# Patient Record
Sex: Female | Born: 1955
Health system: Southern US, Community
[De-identification: ages and names within clinical notes are randomized; demographics above are authoritative.]

## PROBLEM LIST (undated history)

## (undated) DIAGNOSIS — I639 Cerebral infarction, unspecified: Secondary | ICD-10-CM

## (undated) DIAGNOSIS — M8580 Other specified disorders of bone density and structure, unspecified site: Secondary | ICD-10-CM

## (undated) DIAGNOSIS — Z8619 Personal history of other infectious and parasitic diseases: Secondary | ICD-10-CM

## (undated) DIAGNOSIS — I10 Essential (primary) hypertension: Secondary | ICD-10-CM

## (undated) DIAGNOSIS — Q2112 Patent foramen ovale: Secondary | ICD-10-CM

## (undated) DIAGNOSIS — E079 Disorder of thyroid, unspecified: Secondary | ICD-10-CM

## (undated) DIAGNOSIS — R9089 Other abnormal findings on diagnostic imaging of central nervous system: Secondary | ICD-10-CM

## (undated) DIAGNOSIS — R0789 Other chest pain: Secondary | ICD-10-CM

## (undated) DIAGNOSIS — K743 Primary biliary cirrhosis: Secondary | ICD-10-CM

## (undated) DIAGNOSIS — J189 Pneumonia, unspecified organism: Secondary | ICD-10-CM

## (undated) DIAGNOSIS — E7800 Pure hypercholesterolemia, unspecified: Secondary | ICD-10-CM

## (undated) DIAGNOSIS — Q211 Atrial septal defect: Secondary | ICD-10-CM

## (undated) DIAGNOSIS — G43109 Migraine with aura, not intractable, without status migrainosus: Secondary | ICD-10-CM

## (undated) DIAGNOSIS — M3500 Sicca syndrome, unspecified: Secondary | ICD-10-CM

## (undated) DIAGNOSIS — E785 Hyperlipidemia, unspecified: Secondary | ICD-10-CM

## (undated) HISTORY — DX: Other specified disorders of bone density and structure, unspecified site: M85.80

## (undated) HISTORY — DX: Other abnormal findings on diagnostic imaging of central nervous system: R90.89

## (undated) HISTORY — PX: FOOT SURGERY: SHX648

## (undated) HISTORY — DX: Patent foramen ovale: Q21.12

## (undated) HISTORY — DX: Cerebral infarction, unspecified: I63.9

## (undated) HISTORY — PX: FACIAL COSMETIC SURGERY: SHX629

## (undated) HISTORY — PX: TONSILLECTOMY: SHX5217

## (undated) HISTORY — DX: Pneumonia, unspecified organism: J18.9

## (undated) HISTORY — DX: Other chest pain: R07.89

## (undated) HISTORY — PX: COLONOSCOPY: SHX174

## (undated) HISTORY — DX: Essential (primary) hypertension: I10

## (undated) HISTORY — DX: Personal history of other infectious and parasitic diseases: Z86.19

## (undated) HISTORY — PX: BREAST SURGERY: SHX581

## (undated) HISTORY — DX: Sjogren syndrome, unspecified: M35.00

## (undated) HISTORY — DX: Primary biliary cirrhosis: K74.3

## (undated) HISTORY — DX: Atrial septal defect: Q21.1

## (undated) HISTORY — PX: TUBAL LIGATION: SHX77

## (undated) HISTORY — DX: Migraine with aura, not intractable, without status migrainosus: G43.109

## (undated) HISTORY — DX: Hyperlipidemia, unspecified: E78.5

## (undated) HISTORY — DX: Disorder of thyroid, unspecified: E07.9

## (undated) HISTORY — DX: Pure hypercholesterolemia, unspecified: E78.00

---

## 1998-03-07 ENCOUNTER — Other Ambulatory Visit: Admission: RE | Admit: 1998-03-07 | Discharge: 1998-03-07 | Payer: Self-pay | Admitting: Obstetrics and Gynecology

## 1998-06-15 ENCOUNTER — Other Ambulatory Visit: Admission: RE | Admit: 1998-06-15 | Discharge: 1998-06-15 | Payer: Self-pay | Admitting: Family Medicine

## 1998-06-16 ENCOUNTER — Ambulatory Visit (HOSPITAL_COMMUNITY): Admission: RE | Admit: 1998-06-16 | Discharge: 1998-06-16 | Payer: Self-pay | Admitting: Specialist

## 1999-01-31 ENCOUNTER — Ambulatory Visit (HOSPITAL_COMMUNITY): Admission: RE | Admit: 1999-01-31 | Discharge: 1999-01-31 | Payer: Self-pay | Admitting: Family Medicine

## 1999-06-12 ENCOUNTER — Other Ambulatory Visit: Admission: RE | Admit: 1999-06-12 | Discharge: 1999-06-12 | Payer: Self-pay | Admitting: *Deleted

## 2000-05-12 ENCOUNTER — Encounter: Payer: Self-pay | Admitting: Family Medicine

## 2000-05-12 ENCOUNTER — Ambulatory Visit (HOSPITAL_COMMUNITY): Admission: RE | Admit: 2000-05-12 | Discharge: 2000-05-12 | Payer: Self-pay | Admitting: Family Medicine

## 2000-06-03 ENCOUNTER — Other Ambulatory Visit: Admission: RE | Admit: 2000-06-03 | Discharge: 2000-06-03 | Payer: Self-pay | Admitting: Family Medicine

## 2000-06-27 ENCOUNTER — Encounter: Admission: RE | Admit: 2000-06-27 | Discharge: 2000-06-27 | Payer: Self-pay | Admitting: Family Medicine

## 2000-06-27 ENCOUNTER — Encounter: Payer: Self-pay | Admitting: Family Medicine

## 2001-06-12 ENCOUNTER — Other Ambulatory Visit: Admission: RE | Admit: 2001-06-12 | Discharge: 2001-06-12 | Payer: Self-pay | Admitting: Family Medicine

## 2001-06-29 ENCOUNTER — Encounter: Payer: Self-pay | Admitting: Family Medicine

## 2001-06-29 ENCOUNTER — Encounter: Admission: RE | Admit: 2001-06-29 | Discharge: 2001-06-29 | Payer: Self-pay | Admitting: Family Medicine

## 2002-06-22 ENCOUNTER — Other Ambulatory Visit: Admission: RE | Admit: 2002-06-22 | Discharge: 2002-06-22 | Payer: Self-pay | Admitting: Family Medicine

## 2002-07-02 ENCOUNTER — Encounter: Admission: RE | Admit: 2002-07-02 | Discharge: 2002-07-02 | Payer: Self-pay | Admitting: Family Medicine

## 2002-07-02 ENCOUNTER — Encounter: Payer: Self-pay | Admitting: Family Medicine

## 2002-10-14 ENCOUNTER — Ambulatory Visit (HOSPITAL_COMMUNITY): Admission: RE | Admit: 2002-10-14 | Discharge: 2002-10-14 | Payer: Self-pay | Admitting: Family Medicine

## 2002-10-14 ENCOUNTER — Encounter: Payer: Self-pay | Admitting: Family Medicine

## 2003-06-04 ENCOUNTER — Encounter: Payer: Self-pay | Admitting: Family Medicine

## 2003-06-04 ENCOUNTER — Ambulatory Visit (HOSPITAL_COMMUNITY): Admission: RE | Admit: 2003-06-04 | Discharge: 2003-06-04 | Payer: Self-pay | Admitting: Family Medicine

## 2003-07-05 ENCOUNTER — Other Ambulatory Visit: Admission: RE | Admit: 2003-07-05 | Discharge: 2003-07-05 | Payer: Self-pay | Admitting: Family Medicine

## 2003-07-22 ENCOUNTER — Encounter: Payer: Self-pay | Admitting: Family Medicine

## 2003-07-22 ENCOUNTER — Encounter: Admission: RE | Admit: 2003-07-22 | Discharge: 2003-07-22 | Payer: Self-pay | Admitting: Family Medicine

## 2003-08-19 ENCOUNTER — Ambulatory Visit (HOSPITAL_COMMUNITY): Admission: RE | Admit: 2003-08-19 | Discharge: 2003-08-19 | Payer: Self-pay | Admitting: Gastroenterology

## 2004-07-24 ENCOUNTER — Other Ambulatory Visit: Admission: RE | Admit: 2004-07-24 | Discharge: 2004-07-24 | Payer: Self-pay | Admitting: *Deleted

## 2005-06-17 ENCOUNTER — Ambulatory Visit: Payer: Self-pay | Admitting: Cardiology

## 2005-06-21 ENCOUNTER — Ambulatory Visit: Payer: Self-pay

## 2005-09-09 ENCOUNTER — Other Ambulatory Visit: Admission: RE | Admit: 2005-09-09 | Discharge: 2005-09-09 | Payer: Self-pay | Admitting: Family Medicine

## 2005-11-25 DIAGNOSIS — J189 Pneumonia, unspecified organism: Secondary | ICD-10-CM

## 2005-11-25 HISTORY — DX: Pneumonia, unspecified organism: J18.9

## 2006-01-23 ENCOUNTER — Inpatient Hospital Stay (HOSPITAL_COMMUNITY): Admission: EM | Admit: 2006-01-23 | Discharge: 2006-01-25 | Payer: Self-pay | Admitting: Emergency Medicine

## 2006-09-12 ENCOUNTER — Other Ambulatory Visit: Admission: RE | Admit: 2006-09-12 | Discharge: 2006-09-12 | Payer: Self-pay | Admitting: Family Medicine

## 2008-11-25 DIAGNOSIS — I639 Cerebral infarction, unspecified: Secondary | ICD-10-CM

## 2008-11-25 HISTORY — DX: Cerebral infarction, unspecified: I63.9

## 2009-09-11 ENCOUNTER — Encounter: Payer: Self-pay | Admitting: Internal Medicine

## 2009-09-11 ENCOUNTER — Inpatient Hospital Stay (HOSPITAL_COMMUNITY): Admission: AD | Admit: 2009-09-11 | Discharge: 2009-09-13 | Payer: Self-pay | Admitting: Internal Medicine

## 2009-09-12 ENCOUNTER — Ambulatory Visit: Payer: Self-pay | Admitting: Vascular Surgery

## 2009-09-12 ENCOUNTER — Encounter (INDEPENDENT_AMBULATORY_CARE_PROVIDER_SITE_OTHER): Payer: Self-pay | Admitting: Family Medicine

## 2009-10-04 ENCOUNTER — Encounter: Payer: Self-pay | Admitting: Cardiovascular Disease

## 2009-10-10 ENCOUNTER — Encounter: Payer: Self-pay | Admitting: Cardiovascular Disease

## 2009-10-11 ENCOUNTER — Encounter: Payer: Self-pay | Admitting: Cardiovascular Disease

## 2009-10-11 ENCOUNTER — Ambulatory Visit: Payer: Self-pay

## 2009-10-16 ENCOUNTER — Encounter: Payer: Self-pay | Admitting: Cardiovascular Disease

## 2009-10-16 ENCOUNTER — Encounter (INDEPENDENT_AMBULATORY_CARE_PROVIDER_SITE_OTHER): Payer: Self-pay | Admitting: *Deleted

## 2009-10-16 ENCOUNTER — Telehealth (INDEPENDENT_AMBULATORY_CARE_PROVIDER_SITE_OTHER): Payer: Self-pay | Admitting: *Deleted

## 2009-10-17 ENCOUNTER — Encounter: Payer: Self-pay | Admitting: Cardiovascular Disease

## 2009-10-24 ENCOUNTER — Ambulatory Visit: Payer: Self-pay | Admitting: Cardiovascular Disease

## 2009-10-24 ENCOUNTER — Ambulatory Visit (HOSPITAL_COMMUNITY): Admission: RE | Admit: 2009-10-24 | Discharge: 2009-10-24 | Payer: Self-pay | Admitting: Cardiovascular Disease

## 2010-03-01 ENCOUNTER — Observation Stay (HOSPITAL_COMMUNITY): Admission: EM | Admit: 2010-03-01 | Discharge: 2010-03-02 | Payer: Self-pay | Admitting: Emergency Medicine

## 2010-03-01 ENCOUNTER — Ambulatory Visit: Payer: Self-pay | Admitting: Cardiology

## 2010-03-02 ENCOUNTER — Encounter: Payer: Self-pay | Admitting: Cardiology

## 2010-03-02 ENCOUNTER — Encounter (INDEPENDENT_AMBULATORY_CARE_PROVIDER_SITE_OTHER): Payer: Self-pay | Admitting: Emergency Medicine

## 2010-03-21 DIAGNOSIS — I10 Essential (primary) hypertension: Secondary | ICD-10-CM | POA: Insufficient documentation

## 2010-03-21 DIAGNOSIS — E039 Hypothyroidism, unspecified: Secondary | ICD-10-CM

## 2010-03-21 DIAGNOSIS — Z8679 Personal history of other diseases of the circulatory system: Secondary | ICD-10-CM | POA: Insufficient documentation

## 2010-03-22 ENCOUNTER — Ambulatory Visit: Payer: Self-pay | Admitting: Cardiology

## 2010-03-22 DIAGNOSIS — R072 Precordial pain: Secondary | ICD-10-CM

## 2010-03-30 ENCOUNTER — Telehealth: Payer: Self-pay | Admitting: Cardiology

## 2010-11-05 ENCOUNTER — Emergency Department (HOSPITAL_BASED_OUTPATIENT_CLINIC_OR_DEPARTMENT_OTHER)
Admission: EM | Admit: 2010-11-05 | Discharge: 2010-11-05 | Disposition: A | Discharge status: Other Acute Inpatient Hospital | Payer: Self-pay | Source: Home / Self Care | Admitting: Emergency Medicine

## 2010-11-05 ENCOUNTER — Emergency Department (HOSPITAL_COMMUNITY)
Admission: EM | Admit: 2010-11-05 | Discharge: 2010-11-05 | Payer: Self-pay | Source: Home / Self Care | Admitting: Emergency Medicine

## 2010-12-23 LAB — CONVERTED CEMR LAB
BUN: 8 mg/dL (ref 6–23)
GFR calc non Af Amer: 78.99 mL/min (ref >60)
Magnesium: 2.3 mg/dL (ref 1.5–2.5)

## 2010-12-27 NOTE — Assessment & Plan Note (Signed)
Summary: eph   Visit Type:  Post-hospital   History of Present Illness: She went to see Nash Dimmer, after she used some chemicals, and then had a reaction similar to what she had.  She is much better.  She then went back to work Monday, and felt like someone was choking her.  She is much better.  She had prior evaluation for stroke last fall, of unclear cause.  She is now stable on K supplement.    Current Medications (verified): 1)  Potassium Chloride Cr 10 Meq Cr-Caps (Potassium Chloride) .... Take One Tablet By Mouth Daily 2)  Aspirin 81 Mg Tbec (Aspirin) .... Take One Tablet By Mouth Daily 3)  Ativan 0.5 Mg Tabs (Lorazepam) .... 1/2 Tablet As Needed 4)  Crestor 10 Mg Tabs (Rosuvastatin Calcium) .... Take One Tablet By Mouth Daily. 5)  Hydrochlorothiazide 25 Mg Tabs (Hydrochlorothiazide) .... Take One Tablet By Mouth Daily. 6)  Levothyroxine Sodium 100 Mcg Tabs (Levothyroxine Sodium) .... Take 1 Tablet By Mouth Once A Day 7)  Vitamin D2 400 Iu .... Once A Day  Allergies: 1)  ! Codeine 2)  ! Ibuprofen  Vital Signs:  Patient profile:   55 year old female Height:      65 inches Weight:      124.25 pounds BMI:     20.75 Pulse rate:   67 / minute Pulse rhythm:   regular Resp:     18 per minute BP sitting:   120 / 70  (left arm) Cuff size:   regular  Vitals Entered By: Vikki Ports (March 22, 2010 2:31 PM)  Physical Exam  General:  Well developed, well nourished, in no acute distress. Head:  normocephalic and atraumatic Eyes:  PERRLA/EOM intact; conjunctiva and lids normal. Lungs:  Clear bilaterally to auscultation and percussion. Heart:  PMI non displaced.  No murmur or rub.  No sig gallop. Pulses:  pulses normal in all 4 extremities Extremities:  No clubbing or cyanosis. Neurologic:  Alert and oriented x 3.   EKG  Procedure date:  03/22/2010  Findings:      NSR.  WNL.  Echocardiogram  Procedure date:  09/12/2009  Findings:      Study Conclusions   Left  ventricle: The cavity size was normal. Systolic function was   normal. Wall motion was normal; there were no regional wall motion   abnormalities.   Impressions:    - No cardiac source of emboli was indentified.  Impression & Recommendations:  Problem # 1:  CHEST PAIN, PRECORDIAL (ICD-786.51) symptoms resolved.  See history.  negative intitial workup.  Need for K check discussed. Her updated medication list for this problem includes:    Aspirin 81 Mg Tbec (Aspirin) .Marland Kitchen... Take one tablet by mouth daily  Orders: EKG w/ Interpretation (93000) TLB-BMP (Basic Metabolic Panel-BMET) (80048-METABOL) TLB-Magnesium (Mg) (83735-MG)  Problem # 2:  HYPOTHYROIDISM (ICD-244.9) treated in [place] Her updated medication list for this problem includes:    Levothyroxine Sodium 100 Mcg Tabs (Levothyroxine sodium) .Marland Kitchen... Take 1 tablet by mouth once a day  Problem # 3:  CEREBROVASCULAR ACCIDENT, HX OF (ICD-V12.50) Is followed by neuro.  Had TEE in November by Dr. Eden Emms with negative bubble, no LA appendage clot.  Listed under consultation, not echo.  Patient Instructions: 1)  Your physician recommends that you schedule a follow-up appointment as needed.  2)  Your physician recommends that you have lab work today: BMP, Magnesium 3)  Your physician recommends that you continue on your  current medications as directed. Please refer to the Current Medication list given to you today.

## 2010-12-27 NOTE — Progress Notes (Signed)
Summary: lab results-LM  Phone Note Call from Patient Call back at [phone]   Caller: Patient Summary of Call: Pt calling for lab test Initial call taken by: Judie Grieve,  Mar 30, 2010 8:28 AM  Follow-up for Phone Call        [facility]  we did not rec labs from Dr [facility] office yet.  Will call and request labs and call pt Ledon Snare, RN  Mar 30, 2010 9:55 AM spoke with pt she will have Lupita Leash at Dr [facility] office draw labs on Fri 04/06/10 Dennis Bast, RN, BSN  Mar 30, 2010 4:12 PM

## 2010-12-27 NOTE — Letter (Signed)
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

## 2011-02-05 LAB — BASIC METABOLIC PANEL
BUN: 15 mg/dL (ref 6–23)
CO2: 26 mEq/L (ref 19–32)
Chloride: 103 mEq/L (ref 96–112)
Creatinine, Ser: 0.8 mg/dL (ref 0.4–1.2)
GFR calc non Af Amer: >60 mL/min (ref >60)
Glucose, Bld: 83 mg/dL (ref 70–99)
Potassium: 3.9 mEq/L (ref 3.5–5.1)

## 2011-02-13 LAB — COMPREHENSIVE METABOLIC PANEL
Albumin: 3.8 g/dL (ref 3.5–5.2)
BUN: 11 mg/dL (ref 6–23)
CO2: 28 mEq/L (ref 19–32)
GFR calc Af Amer: >60 mL/min (ref >60)
Glucose, Bld: 84 mg/dL (ref 70–99)
Potassium: 3.3 mEq/L — ABNORMAL LOW (ref 3.5–5.1)
Total Bilirubin: 0.7 mg/dL (ref 0.3–1.2)

## 2011-02-13 LAB — TSH: TSH: 0.344 u[IU]/mL — ABNORMAL LOW (ref 0.350–4.500)

## 2011-02-13 LAB — POCT CARDIAC MARKERS
CKMB, poc: <1 ng/mL — ABNORMAL LOW (ref 1.0–8.0)
Myoglobin, poc: 76.4 ng/mL (ref 12–200)
Troponin i, poc: <0.05 ng/mL (ref 0.00–0.09)

## 2011-02-13 LAB — CARDIAC PANEL(CRET KIN+CKTOT+MB+TROPI)
CK, MB: 0.4 ng/mL (ref 0.3–4.0)
CK, MB: 0.5 ng/mL (ref 0.3–4.0)
Relative Index: INVALID (ref 0.0–2.5)
Relative Index: INVALID (ref 0.0–2.5)
Total CK: 28 U/L (ref 7–177)
Total CK: 31 U/L (ref 7–177)
Total CK: 32 U/L (ref 7–177)
Troponin I: 0.01 ng/mL (ref 0.00–0.06)
Troponin I: <0.01 ng/mL (ref 0.00–0.06)

## 2011-02-13 LAB — DIFFERENTIAL
Basophils Absolute: 0 10*3/uL (ref 0.0–0.1)
Basophils Relative: 0 % (ref 0–1)
Eosinophils Absolute: 0.4 10*3/uL (ref 0.0–0.7)
Lymphocytes Relative: 16 % (ref 12–46)
Monocytes Relative: 11 % (ref 3–12)
Neutrophils Relative %: 65 % (ref 43–77)

## 2011-02-13 LAB — LIPID PANEL
HDL: 58 mg/dL (ref >39)
Total CHOL/HDL Ratio: 2.3 RATIO

## 2011-02-13 LAB — CBC
MCHC: 33.5 g/dL (ref 30.0–36.0)
RDW: 13 % (ref 11.5–15.5)
WBC: 6.1 10*3/uL (ref 4.0–10.5)

## 2011-02-13 LAB — BASIC METABOLIC PANEL
CO2: 27 mEq/L (ref 19–32)
Calcium: 9.1 mg/dL (ref 8.4–10.5)
Glucose, Bld: 89 mg/dL (ref 70–99)

## 2011-02-13 LAB — APTT: aPTT: 33 seconds (ref 24–37)

## 2011-02-13 LAB — CK TOTAL AND CKMB (NOT AT ARMC): Total CK: 36 U/L (ref 7–177)

## 2011-02-13 LAB — TROPONIN I: Troponin I: <0.01 ng/mL (ref 0.00–0.06)

## 2011-02-28 LAB — DIFFERENTIAL
Basophils Absolute: 0 10*3/uL (ref 0.0–0.1)
Eosinophils Absolute: 0.6 10*3/uL (ref 0.0–0.7)
Eosinophils Relative: 7 % — ABNORMAL HIGH (ref 0–5)
Lymphs Abs: 2 10*3/uL (ref 0.7–4.0)
Neutrophils Relative %: 57 % (ref 43–77)

## 2011-02-28 LAB — PROTIME-INR: Prothrombin Time: 13.6 seconds (ref 11.6–15.2)

## 2011-02-28 LAB — TSH: TSH: 1.879 u[IU]/mL (ref 0.350–4.500)

## 2011-02-28 LAB — T4, FREE: Free T4: 1.38 ng/dL (ref 0.80–1.80)

## 2011-02-28 LAB — BASIC METABOLIC PANEL
BUN: 18 mg/dL (ref 6–23)
BUN: 8 mg/dL (ref 6–23)
Calcium: 9 mg/dL (ref 8.4–10.5)
Chloride: 104 mEq/L (ref 96–112)
Creatinine, Ser: 0.79 mg/dL (ref 0.4–1.2)
Creatinine, Ser: 0.79 mg/dL (ref 0.4–1.2)
GFR calc non Af Amer: >60 mL/min (ref >60)
GFR calc non Af Amer: >60 mL/min (ref >60)
Glucose, Bld: 94 mg/dL (ref 70–99)
Glucose, Bld: 140 mg/dL — ABNORMAL HIGH (ref 70–99)
Potassium: 3.1 mEq/L — ABNORMAL LOW (ref 3.5–5.1)

## 2011-02-28 LAB — CBC
HCT: 43.2 % (ref 36.0–46.0)
MCV: 92.9 fL (ref 78.0–100.0)
Platelets: 211 10*3/uL (ref 150–400)
RDW: 13.6 % (ref 11.5–15.5)
WBC: 7.8 10*3/uL (ref 4.0–10.5)

## 2011-04-12 NOTE — H&P (Signed)
Dana [name], Dana [name]                  ACCOUNT NO.:  [id number]   MEDICAL RECORD NO.:  [id number]          PATIENT TYPE:  INP   LOCATION:  1317                         FACILITY:  [facility]   PHYSICIAN:  Kela Millin, M.D.DATE OF BIRTH:  [birth year]   DATE OF ADMISSION:  01/23/2006  DATE OF DISCHARGE:                                HISTORY & PHYSICAL   PRIMARY CARE PHYSICIAN:  Unassigned.   CHIEF COMPLAINT:  Cough and fevers.   HISTORY OF PRESENT ILLNESS:  The patient is a 55 year old white female with  past medical history significant for recent diagnosis of  bronchitis/pneumonia per her primary care physician, hypertension,  hypercholesterolemia, depression/anxiety, and hypothyroidism who presents  with complaints of cough and fevers x6 days. She states that when her  symptoms started she was seen at an urgent care and started on azithromycin.  She did follow up with her primary care physician at which time she was  diagnosed with pneumonia and asked to continue her antibiotics but to follow  up today, Thursday, if she did not feel any better. She states that she  continued to have the cough, slightly productive of greenish sputum as well  as fevers, and so she came back to her primary care physician.  A chest x-  ray was done and she was given Avelox by mouth but she vomited, and then she  was given IM Rocephin and asked to come to the [facility] ER for possible  admission. She denies chest pain, dysuria, melena, hematemesis, hemoptysis,  and no hematochezia.   She was seen in the ER and the chest x-ray she brought with her was  consistent with a right lower lobe pneumonia and also she was found to have  an elevated white cell count of 24 with electrolyte abnormalities, and she  is admitted to the [facility] for further evaluation and  management.   PAST MEDICAL HISTORY:  1.  As stated above.  2.  History of migraines.   MEDICATIONS:  1.  Synthroid 88 mcg  daily.  2.  Crestor 10 mg daily.  3.  Hydrochlorothiazide 12.5 daily.  4.  Imitrex p.r.n.  5.  Birth control pills daily.   ALLERGIES/INTOLERANCES:  CODEINE causes nausea.   SOCIAL HISTORY:  Denies tobacco. She also denies alcohol.   FAMILY HISTORY:  She is adopted and so she does not know her family history.   REVIEW OF SYSTEMS:  As per HPI, other review of systems negative.   PHYSICAL EXAMINATION:  GENERAL:  The patient is an acutely-ill middle-aged  white female in no respiratory distress.  VITAL SIGNS:  Temperature 99, her blood pressure is 116/68 with a pulse of  104, and her O2 saturation on room air is 98%.  HEENT:  PERRL, EOMI, slightly dry mucous membranes, no oral exudates.  NECK:  Supple, no adenopathy and no JVD.  CARDIOVASCULAR:  Regular, normal S1, S2.  LUNGS:  Few right basilar crackles, no wheezes.  ABDOMEN:  Soft, bowel sounds present, nontender, nondistended, no  organomegaly and no masses palpable.  EXTREMITIES:  No  cyanosis or edema.   LABORATORY DATA:  Chest x-ray:  Right lower lobe infiltrate. Her white cell  count is 24 with a hemoglobin of 14, hematocrit of 40.5, platelet count of  239, and a neutrophil count of 94%. Her sodium is 133 with a potassium of  2.9, chloride 98, CO2 of 22, glucose 91, BUN 11, creatinine 0.9, calcium of  8.7. Her albumin is 3.1, the ALT is 47, AST 37, alkaline phosphatase is 62,  total bilirubin is 0.9.   ASSESSMENT AND PLAN:  1.  Right lower lobe pneumonia:  Empiric antibiotics,      antitussive/expectorants. Speech therapy for swallowing evaluation to      rule out aspiration.  2.  Volume depletion:  Hydrate, hold hydrochlorothiazide.  3.  Hypokalemia:  Replace potassium.  4.  Hypertension:  Follow and resume hydrochlorothiazide after adequate      hydration.  5.  Hypothyroidism:  Continue Synthroid.      Kela Millin, M.D.  Electronically Signed     ACV/MEDQ  D:  01/24/2006  T:  01/24/2006  Job:  [id number]    cc:   Ernestina Penna, M.D.  Fax: [phone]

## 2011-04-12 NOTE — Op Note (Signed)
NAME:  Dana [name]                          ACCOUNT NO.:  [id number]   MEDICAL RECORD NO.:  [id number]                   PATIENT TYPE:  AMB   LOCATION:  ENDO                                 FACILITY:  [facility]   PHYSICIAN:  Danise Edge, M.D.                DATE OF BIRTH:  [birth year]   DATE OF PROCEDURE:  08/19/2003  DATE OF DISCHARGE:                                 OPERATIVE REPORT   PROCEDURE:  Colonoscopy.   REFERRED BY:  Ernestina Penna, M.D.   INDICATIONS FOR PROCEDURE:  [name] is a 55 year old female born  [birth year]. [name] has chronic constipation which has worsened to  the point that she will go a week between bowel movements. During the week,  she tends to feel bloated and gassy but has no urges to have a bowel  movement. She usually takes Milk of Magnesia each week to stimulate a bowel  movement. She has pain in her tailbone; she tells me she had an x-ray of the  coccyx which showed no abnormality. She reports no gastrointestinal  bleeding.   ALLERGIES:  CODEINE.   CHRONIC MEDICATIONS:  Loestrol, Synthroid, hydrochlorothiazide, Crestor,  Wellbutrin.   PAST MEDICAL HISTORY:  Menstrual cramps, hypothyroidism, hypertension,  hypercholesterolemia, depression, remote cosmetic surgery, tubal ligation,  tonsillectomy.   LABORATORY DATA:  CBC normal. Urinalysis revealed microscopic pyuria and  microscopic hematuria. Cholesterol profile normal. Complete metabolic  profile normal including serum calcium.   ENDOSCOPIST:  Charolett Bumpers, M.D.   PREMEDICATION:  Versed 10 mg, Demerol 100 mg   DESCRIPTION OF PROCEDURE:  After obtaining informed consent, [name] was  placed in the left lateral decubitus position. I administered intravenous  Demerol and intravenous Versed to achieve conscious sedation for the  procedure. The patient's blood pressure, oxygen saturation and cardiac  rhythm were monitored throughout the procedure and documented in the  medical  record.   Anal inspection was normal. Digital rectal exam was normal. The Olympus  adjustable pediatric colonoscope was introduced into the rectum and advanced  to the cecum. [name] has a tortuous left colon making advancement of the  endoscope around the colon difficult. Colonic preparation for the exam today  was satisfactory.   RECTUM:  Normal.   SIGMOID COLON AND DESCENDING COLON:  Normal.   SPLENIC FLEXURE:  Normal.   TRANSVERSE COLON:  Normal.   HEPATIC FLEXURE:  Normal.   ASCENDING COLON:  Normal.   CECUM AND ILEOCECAL VALVE:  Normal.   ASSESSMENT:  Normal screening proctocolonoscopy to the cecum. No endoscopic  evidence fo the presence of colorectal neoplasia.   RECOMMENDATIONS:  [name] will use a magnesium osmotic laxative daily or  every other day to stimulate a bowel movement. No further evaluation is  required at this time. She should undergo a virtual colonoscopy in  approximately 10 years.  Danise Edge, M.D.    MJ/MEDQ  D:  08/19/2003  T:  08/20/2003  Job:  [id number]

## 2012-01-03 ENCOUNTER — Ambulatory Visit (INDEPENDENT_AMBULATORY_CARE_PROVIDER_SITE_OTHER): Payer: Managed Care, Other (non HMO) | Admitting: Family Medicine

## 2012-01-03 VITALS — BP 110/70 | HR 76 | Temp 97.9°F | Resp 16 | Ht 64.0 in | Wt 125.0 lb

## 2012-01-03 DIAGNOSIS — L989 Disorder of the skin and subcutaneous tissue, unspecified: Secondary | ICD-10-CM

## 2012-01-03 DIAGNOSIS — E785 Hyperlipidemia, unspecified: Secondary | ICD-10-CM | POA: Insufficient documentation

## 2012-01-03 DIAGNOSIS — J329 Chronic sinusitis, unspecified: Secondary | ICD-10-CM

## 2012-01-03 MED ORDER — LEVOFLOXACIN 500 MG PO TABS: 500.0000 mg | ORAL_TABLET | Freq: Every day | ORAL | Status: AC

## 2012-01-03 NOTE — Progress Notes (Signed)
  Subjective:    Patient ID: Dana [name], female    DOB: 1956-04-26, 56 y.o.   MRN: [id number]  HPI This is a 56 year old woman comes in with 2 complaints. First is that she's had sinus congestion for a week associated with cough. Fatigue and sore throat that is not insignificant. Her family thinks that she's coughing too much and because of a history of pneumonia she wants to have this evaluated.  In addition she has a small area on her back that is been growing for 2 weeks and she wants this checked. It's a small crusty area at the belt line on the left lower paraspinal region.     Review of Systems  Constitutional: Positive for fatigue.  HENT: Positive for congestion and postnasal drip.   Eyes: Negative.   Respiratory: Positive for cough.   Cardiovascular: Negative.   Gastrointestinal: Negative.   Genitourinary: Negative.   Musculoskeletal: Negative.   Neurological: Negative.   Hematological: Negative.   Psychiatric/Behavioral: Negative.   All other systems reviewed and are negative.       Objective:   Physical Exam  Head normocephalic without abnormalities, ears normal, nose swollen red with mucopurulent discharge, neck supple without adenopathy or thyromegaly.  Oropharynx clear with mild erythema in the posterior pharynx.  Chest: Clear  Heart regular no murmur   Skin: 5 mm crusty raised lesion left lower lumbar paraspinal area just above the posterior superior iliac crest.  Assessment: 1 sinusitis, 2 small 5 mm Christi actinic keratosis like lesion left lumbar region. Prograf plan: 1 Levaquin 500 mg daily x7 days, biopsy of lower back skin lesion      Assessment & Plan:

## 2012-01-03 NOTE — Progress Notes (Signed)
Patient placed in supine position and 0.6 oval raised lesion was cleansed thoroughly with alcohol.  1cc of 1% lidocaine with epinephrine was injected and #15 blade used to make an elliptical excision and lesion was removed and sent to pathology.  Hemostasis was obtained with direct pressure and #2 simple interrupted sutures were placed.  A telfa dressing was placed and wound instructions were given.  Pt tolerated well.  RTC in 7 days for suture removal.

## 2012-04-27 ENCOUNTER — Encounter: Payer: Self-pay | Admitting: Internal Medicine

## 2012-04-27 ENCOUNTER — Ambulatory Visit (INDEPENDENT_AMBULATORY_CARE_PROVIDER_SITE_OTHER): Payer: Self-pay | Admitting: Internal Medicine

## 2012-04-27 VITALS — BP 130/80 | HR 86 | Temp 99.1°F | Ht 64.5 in | Wt 125.5 lb

## 2012-04-27 DIAGNOSIS — E039 Hypothyroidism, unspecified: Secondary | ICD-10-CM

## 2012-04-27 DIAGNOSIS — G43109 Migraine with aura, not intractable, without status migrainosus: Secondary | ICD-10-CM

## 2012-04-27 DIAGNOSIS — E785 Hyperlipidemia, unspecified: Secondary | ICD-10-CM

## 2012-04-27 DIAGNOSIS — Z8679 Personal history of other diseases of the circulatory system: Secondary | ICD-10-CM

## 2012-04-27 DIAGNOSIS — N951 Menopausal and female climacteric states: Secondary | ICD-10-CM

## 2012-04-27 DIAGNOSIS — I10 Essential (primary) hypertension: Secondary | ICD-10-CM

## 2012-04-27 HISTORY — DX: Hyperlipidemia, unspecified: E78.5

## 2012-04-27 NOTE — Patient Instructions (Addendum)
No new meds at this time.  Plan check up preventive visit in 6 months with labs pre visit. Contact us in the meantime  if having concerns.

## 2012-04-27 NOTE — Progress Notes (Signed)
Subjective:    Patient ID: Dana [name], female    DOB: 1956-06-11, 56 y.o.   MRN: [id number]  HPI Patient comes in as new patient visit . Previous care was  pcp Dr Paulita Cradle  In  [place] .   She is concerned about hot flashes that she has developed ever since she was taken off oral contraceptive pills low overall to about age 67 when she developed diplopia strokelike symptoms and was evaluated by Dr. Anne Hahn and in the hospital by Dr. Riley Kill for possible CVA.  MRI MRA of the head and neck was more consistent with possible white matter disease or low-grade small vessel ischemia. Dr. Anne Hahn treated her with prednisone spinal tap is not consisted with a mass. She was noted to have PACs there were positional and some symptoms that were RAD related to bathroom cleaners. She was also noted to have a borderline low potassium on her hydrochlorothiazide and was replaced at that time. This was in 2010.  At that time hot flashes as above however her migraine headaches that have or have subsided she has no neurologic symptoms just gets sweaty at time gets a bit anxious and unsure whether her sweats could be related to other diseases..   Ongoing diagnoses for which she has medication and has been getting laboratory monitoring every 6 months. Last levels were in January 2013;reviewed is normal. Hypothyroid ; On medications a number of years generic last TSH was normal ;on 88 mcg tablet Hyperlipidemia: Has had very high cholesterol the past in the 280 range and is been on Crestor low dose 5 mg ;samples and cutting them down. Not that interested in Lipitor as her sister had side effects from this. Had migraines and was on ocps and then  loovral .   Has hx of  Migraine with auras.   Less off hormones.   2010    Has seen Dr. Billy Coast  for breakthrough bleeding on low dose hormone replacement therapy it subsided when it was stopped    .  Bio  Alcoholic  Mom and maternal aunt breast cancer  Review of  Systems Hot flushes    Some panic attacks .   Occasional jaw pain anxiety about her hot flushes  No syncope or exercise intolerance cough no gi sx today gu other than above . Hx of mild leg swelling told it could be normal   Past history family history social history reviewed in the electronic medical record. Entered in she is adopted    Sister age 21 who raised her  Died suddenly this past year from ? Heart attack .   Outpatient Encounter Prescriptions as of 04/27/2012  Medication Sig Dispense Refill  . aspirin 81 MG tablet Take 81 mg by mouth daily.      . hydrochlorothiazide (HYDRODIURIL) 25 MG tablet Take 25 mg by mouth daily.      Marland Kitchen levothyroxine (SYNTHROID, LEVOTHROID) 88 MCG tablet Take 88 mcg by mouth daily.      . potassium chloride (K-DUR,KLOR-CON) 10 MEQ tablet Take 10 mEq by mouth daily.      . rosuvastatin (CRESTOR) 5 MG tablet Take 5 mg by mouth daily.      . calcium carbonate, dosed in mg elemental calcium, 1250 MG/5ML Take 1,000 mg of elemental calcium by mouth daily.       Stopped taking calcium cause of gi upset.      Objective:   Physical Exam BP 130/80  Pulse 86  Temp(Src) 99.1 F (  37.3 C) (Oral)  Ht 5' 4.5" (1.638 m)  Wt 125 lb 8 oz (56.926 kg)  BMI 21.21 kg/m2  SpO2 95%  LMP 04/25/2009 wdwn in nad looks well  HEENT: Normocephalic ;atraumatic , Eyes;  PERRL, EOMs  Full, lids and conjunctiva clear,,Ears: no deformities, canals nl, TM landmarks normal, Nose: no deformity or discharge  Mouth : OP clear without lesion or edema . Neck: Supple without adenopathy or masses or bruits Chest:  Clear to A&P without wheezes rales or rhonchi CV:  S1-S2 no gallops or murmurs peripheral perfusion is normal Abdomen:  Sof,t normal bowel sounds without hepatosplenomegaly, no guarding rebound or masses no CVA tenderness No clubbing cyanosis  no sig edema NEURO: oriented x 3 CN 3-12 appear intact. No focal muscle weakness or atrophy.Gait WNL.  Grossly non focal. No tremor or  abnormal movement. Skin: normal capillary refill ,turgor , color: No acute rashes ,petechiae or bruising Reviewed nl labs from Jan 2013     Assessment & Plan:  Hot flushes    Menopausal   Problematic  And other options not often effective . But some are helpful .   Risk benefit of medication discussed. Will observe and look at diet and exercise for now.  At this time would hesitate to use meds with her hx of stroke like  Sx while on ocps  evaluation  And  Migraines with aura seemingly aggravated by ocps .  In her 79s.  Agree with staying on asa  Low dose hctz and potassium  Avoid salt and sodium added.  HT:  Stable  Thyroid  No change in meds  Check yearly  HCM utd on pneumovax colonscreen and ? Td  G1P1   Reactive anxiety   Related to sisters sudden death encouraged to contact us of on call service if any concerns to   That she should not have to decide by herself which sx are significant if she is unsure .   Counseled.

## 2012-06-21 ENCOUNTER — Ambulatory Visit (INDEPENDENT_AMBULATORY_CARE_PROVIDER_SITE_OTHER): Payer: Managed Care, Other (non HMO) | Admitting: Family Medicine

## 2012-06-21 ENCOUNTER — Ambulatory Visit: Payer: Managed Care, Other (non HMO)

## 2012-06-21 VITALS — BP 120/70 | HR 72 | Temp 98.5°F | Resp 16 | Ht <= 58 in | Wt 125.0 lb

## 2012-06-21 DIAGNOSIS — R05 Cough: Secondary | ICD-10-CM

## 2012-06-21 DIAGNOSIS — J029 Acute pharyngitis, unspecified: Secondary | ICD-10-CM

## 2012-06-21 DIAGNOSIS — R059 Cough, unspecified: Secondary | ICD-10-CM

## 2012-06-21 MED ORDER — BENZONATATE 100 MG PO CAPS: 200.0000 mg | ORAL_CAPSULE | Freq: Two times a day (BID) | ORAL | Status: AC | PRN

## 2012-06-21 NOTE — Progress Notes (Signed)
[facility]:  Office Visit  Chief Complaint:  Chief Complaint  Patient presents with  . Cough    HPI: Dana [name] is a 56 y.o. female who complains of  Cough x 3 days, yellow mucus, ? Chills, h/o PNA and h/o heart condition. Tried [employer]  and also Tylenol withminimal releif. Deneis fevers, sinus pressure or ear pain.   Past Medical History  Diagnosis Date  . High blood pressure     readings  . High cholesterol   . Migraine with aura     auras  . Thyroid disease   . Stroke     double vision- possible blood clot on brain; Dr. Lesia Sago at [facility]   . Pneumonia 2007    and flu hospitalized   . Hx of varicella    Past Surgical History  Procedure Date  . Tonsillectomy and adenoidectomy    History   Social History  . Marital Status: Married    Spouse Name: N/A    Number of Children: N/A  . Years of Education: N/A   Social History Main Topics  . Smoking status: Never Smoker   . Smokeless tobacco: Never Used  . Alcohol Use: No  . Drug Use: No  . Sexually Active: Yes   Other Topics Concern  . 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 Neg tadSome caffiene Seatbelts no ets fa stored G1P1   Family History  Problem Relation Age of Onset  . Adopted: Yes  . Cancer Maternal Aunt     breast   Allergies  Allergen Reactions  . Codeine     REACTION: nausea  . Ibuprofen     REACTION: nause   Prior to Admission medications   Medication Sig Start Date End Date Taking? Authorizing Provider  aspirin 81 MG tablet Take 81 mg by mouth daily.   Yes Historical Provider, MD  hydrochlorothiazide (HYDRODIURIL) 25 MG tablet Take 25 mg by mouth daily.   Yes Historical Provider, MD  levothyroxine (SYNTHROID, LEVOTHROID) 88 MCG tablet Take 88 mcg by mouth daily.   Yes Historical Provider, MD    potassium chloride (K-DUR,KLOR-CON) 10 MEQ tablet Take 10 mEq by mouth daily.   Yes Historical Provider, MD  rosuvastatin (CRESTOR) 5 MG tablet Take 5 mg by mouth daily.   Yes Historical Provider, MD  calcium carbonate, dosed in mg elemental calcium, 1250 MG/5ML Take 1,000 mg of elemental calcium by mouth daily.    Historical Provider, MD     ROS: The patient denies fevers, night sweats, unintentional weight loss, chest pain, palpitations, wheezing, dyspnea on exertion, nausea, vomiting, abdominal pain, dysuria, hematuria, melena, numbness, weakness, or tingling.   All other systems have been reviewed and were otherwise negative with the exception of those mentioned in the HPI and as above.    PHYSICAL EXAM: Filed Vitals:   06/21/12 1030  BP: 120/70  Pulse: 72  Temp: 98.5 F (36.9 C)  Resp: 16   Filed Vitals:   06/21/12 1030  Height: 4\' 6"  (1.372 m)  Weight: 125 lb (56.7 kg)   Body mass index is 30.14 kg/(m^2).  General: Alert, no acute distress HEENT:  Normocephalic, atraumatic, oropharynx patent. No exudates, TM nl. Erythematous OP Cardiovascular:  Regular rate and rhythm, no rubs murmurs or gallops.  No Carotid bruits, radial  pulse intact. No pedal edema.  Respiratory: Clear to auscultation bilaterally.  No wheezes, rales, or rhonchi.  No cyanosis, no use of accessory musculature GI: No organomegaly, abdomen is soft and non-tender, positive bowel sounds.  No masses. Skin: No rashes. Neurologic: Facial musculature symmetric. Psychiatric: Patient is appropriate throughout our interaction. Lymphatic: No cervical lymphadenopathy Musculoskeletal: Gait intact.   LABS: Results for orders placed in visit on 04/27/12  HM COLONOSCOPY      Component Value Range   HM Colonoscopy done per pt    HM MAMMOGRAPHY      Component Value Range   HM Mammogram done per pt       EKG/XRAY:   Primary read interpreted by Dr. Conley Rolls at [facility]. ? Bronchitic changes  Otherwise negative for  infiltrate/effusion/pneumothorax   ASSESSMENT/PLAN: Encounter Diagnoses  Name Primary?  . Cough Yes  . Pharyngitis    Gave paper rx for Amoxacillin 875 mg BID x 10 days and also Dilfucan #2; may take abx if no improvement in 2-3 days Rx Tessalon Perles for cough    Latreshia Beauchaine PHUONG, DO 06/21/2012 11:09 AM

## 2012-06-25 ENCOUNTER — Telehealth: Payer: Self-pay

## 2012-06-25 MED ORDER — HYDROCODONE-HOMATROPINE 5-1.5 MG/5ML PO SYRP: 5.0000 mL | ORAL_SOLUTION | Freq: Three times a day (TID) | ORAL | Status: AC | PRN

## 2012-06-25 NOTE — Telephone Encounter (Signed)
Since she is allergic to Codeine, there are not many options outside of the tessalon perles she is already taking. Can she take hydrocodone? If so can call in Hycodan.

## 2012-06-25 NOTE — Telephone Encounter (Signed)
Saw Dr. Conley Rolls Sunday for illness/bronchitis. Given antibiotics and cough pearls. She feels better with the exception of a continuing cough. Would like something for her cough called in to [facility]. Pt phone [phone]

## 2012-06-30 NOTE — Telephone Encounter (Signed)
Open encounter from 8/1

## 2012-09-08 ENCOUNTER — Ambulatory Visit (INDEPENDENT_AMBULATORY_CARE_PROVIDER_SITE_OTHER): Payer: Managed Care, Other (non HMO)

## 2012-09-08 DIAGNOSIS — Z23 Encounter for immunization: Secondary | ICD-10-CM

## 2012-10-20 ENCOUNTER — Other Ambulatory Visit (INDEPENDENT_AMBULATORY_CARE_PROVIDER_SITE_OTHER): Payer: Managed Care, Other (non HMO)

## 2012-10-20 DIAGNOSIS — Z0000 Encounter for general adult medical examination without abnormal findings: Secondary | ICD-10-CM

## 2012-10-20 LAB — TSH: TSH: 0.28 u[IU]/mL — ABNORMAL LOW (ref 0.35–5.50)

## 2012-10-20 LAB — CBC WITH DIFFERENTIAL/PLATELET
Basophils Absolute: 0 10*3/uL (ref 0.0–0.1)
Eosinophils Absolute: 0.4 10*3/uL (ref 0.0–0.7)
HCT: 40.3 % (ref 36.0–46.0)
Hemoglobin: 13.2 g/dL (ref 12.0–15.0)
Lymphs Abs: 0.9 10*3/uL (ref 0.7–4.0)
MCHC: 32.9 g/dL (ref 30.0–36.0)
Monocytes Relative: 10.8 % (ref 3.0–12.0)
Neutro Abs: 3.1 10*3/uL (ref 1.4–7.7)
RDW: 13.3 % (ref 11.5–14.6)

## 2012-10-20 LAB — LIPID PANEL
Total CHOL/HDL Ratio: 2
Triglycerides: 74 mg/dL (ref 0.0–149.0)

## 2012-10-20 LAB — BASIC METABOLIC PANEL
CO2: 28 mEq/L (ref 19–32)
Calcium: 9.8 mg/dL (ref 8.4–10.5)
Creatinine, Ser: 0.8 mg/dL (ref 0.4–1.2)

## 2012-10-20 LAB — POCT URINALYSIS DIPSTICK
Bilirubin, UA: NEGATIVE
Blood, UA: NEGATIVE
Glucose, UA: NEGATIVE
Leukocytes, UA: NEGATIVE
Nitrite, UA: NEGATIVE

## 2012-10-20 LAB — HEPATIC FUNCTION PANEL
ALT: 30 U/L (ref 0–35)
Bilirubin, Direct: 0.1 mg/dL (ref 0.0–0.3)
Total Bilirubin: 0.7 mg/dL (ref 0.3–1.2)

## 2012-10-27 ENCOUNTER — Ambulatory Visit (INDEPENDENT_AMBULATORY_CARE_PROVIDER_SITE_OTHER): Payer: Managed Care, Other (non HMO) | Admitting: Internal Medicine

## 2012-10-27 ENCOUNTER — Encounter: Payer: Self-pay | Admitting: Internal Medicine

## 2012-10-27 VITALS — BP 124/78 | HR 64 | Temp 98.3°F | Ht 64.75 in | Wt 131.0 lb

## 2012-10-27 DIAGNOSIS — Z0000 Encounter for general adult medical examination without abnormal findings: Secondary | ICD-10-CM

## 2012-10-27 DIAGNOSIS — N951 Menopausal and female climacteric states: Secondary | ICD-10-CM

## 2012-10-27 DIAGNOSIS — E039 Hypothyroidism, unspecified: Secondary | ICD-10-CM

## 2012-10-27 DIAGNOSIS — E785 Hyperlipidemia, unspecified: Secondary | ICD-10-CM

## 2012-10-27 DIAGNOSIS — Z8679 Personal history of other diseases of the circulatory system: Secondary | ICD-10-CM

## 2012-10-27 DIAGNOSIS — I10 Essential (primary) hypertension: Secondary | ICD-10-CM

## 2012-10-27 MED ORDER — ROSUVASTATIN CALCIUM 10 MG PO TABS: ORAL_TABLET | ORAL | Status: DC

## 2012-10-27 MED ORDER — LEVOTHYROXINE SODIUM 75 MCG PO TABS: 88.0000 ug | ORAL_TABLET | Freq: Every day | ORAL | Status: DC

## 2012-10-27 NOTE — Patient Instructions (Addendum)
Continue lifestyle intervention healthy eating and exercise . Change thyroid dose to 75 and recheck tsh lab in 2 months . If ok then continue. And  preventive visit in a year.   Plan for pap smear to be done next year.  Consider updating tdap booster vaccine.   Preventive Care for Adults, Female A healthy lifestyle and preventive care can promote health and wellness. Preventive health guidelines for women include the following key practices.  A routine yearly physical is a good way to check with your caregiver about your health and preventive screening. It is a chance to share any concerns and updates on your health, and to receive a thorough exam.  Visit your dentist for a routine exam and preventive care every 6 months. Brush your teeth twice a day and floss once a day. Good oral hygiene prevents tooth decay and gum disease.  The frequency of eye exams is based on your age, health, family medical history, use of contact lenses, and other factors. Follow your caregiver's recommendations for frequency of eye exams.  Eat a healthy diet. Foods like vegetables, fruits, whole grains, low-fat dairy products, and lean protein foods contain the nutrients you need without too many calories. Decrease your intake of foods high in solid fats, added sugars, and salt. Eat the right amount of calories for you.Get information about a proper diet from your caregiver, if necessary.  Regular physical exercise is one of the most important things you can do for your health. Most adults should get at least 150 minutes of moderate-intensity exercise (any activity that increases your heart rate and causes you to sweat) each week. In addition, most adults need muscle-strengthening exercises on 2 or more days a week.  Maintain a healthy weight. The body mass index (BMI) is a screening tool to identify possible weight problems. It provides an estimate of body fat based on height and weight. Your caregiver can help  determine your BMI, and can help you achieve or maintain a healthy weight.For adults 20 years and older:  A BMI below 18.5 is considered underweight.  A BMI of 18.5 to 24.9 is normal.  A BMI of 25 to 29.9 is considered overweight.  A BMI of 30 and above is considered obese.  Maintain normal blood lipids and cholesterol levels by exercising and minimizing your intake of saturated fat. Eat a balanced diet with plenty of fruit and vegetables. Blood tests for lipids and cholesterol should begin at age 31 and be repeated every 5 years. If your lipid or cholesterol levels are high, you are over 50, or you are at high risk for heart disease, you may need your cholesterol levels checked more frequently.Ongoing high lipid and cholesterol levels should be treated with medicines if diet and exercise are not effective.  If you smoke, find out from your caregiver how to quit. If you do not use tobacco, do not start.  If you are pregnant, do not drink alcohol. If you are breastfeeding, be very cautious about drinking alcohol. If you are not pregnant and choose to drink alcohol, do not exceed 1 drink per day. One drink is considered to be 12 ounces (355 mL) of beer, 5 ounces (148 mL) of wine, or 1.5 ounces (44 mL) of liquor.  Avoid use of street drugs. Do not share needles with anyone. Ask for help if you need support or instructions about stopping the use of drugs.  High blood pressure causes heart disease and increases the risk of stroke. Your  blood pressure should be checked at least every 1 to 2 years. Ongoing high blood pressure should be treated with medicines if weight loss and exercise are not effective.  If you are 62 to 58 years old, ask your caregiver if you should take aspirin to prevent strokes.  Diabetes screening involves taking a blood sample to check your fasting blood sugar level. This should be done once every 3 years, after age 85, if you are within normal weight and without risk factors  for diabetes. Testing should be considered at a younger age or be carried out more frequently if you are overweight and have at least 1 risk factor for diabetes.  Breast cancer screening is essential preventive care for women. You should practice "breast self-awareness." This means understanding the normal appearance and feel of your breasts and may include breast self-examination. Any changes detected, no matter how small, should be reported to a caregiver. Women in their 22s and 30s should have a clinical breast exam (CBE) by a caregiver as part of a regular health exam every 1 to 3 years. After age 15, women should have a CBE every year. Starting at age 65, women should consider having a mammography (breast X-ray test) every year. Women who have a family history of breast cancer should talk to their caregiver about genetic screening. Women at a high risk of breast cancer should talk to their caregivers about having magnetic resonance imaging (MRI) and a mammography every year.  The Pap test is a screening test for cervical cancer. A Pap test can show cell changes on the cervix that might become cervical cancer if left untreated. A Pap test is a procedure in which cells are obtained and examined from the lower end of the uterus (cervix).  Women should have a Pap test starting at age 70.  Between ages 21 and 31, Pap tests should be repeated every 2 years.  Beginning at age 20, you should have a Pap test every 3 years as long as the past 3 Pap tests have been normal.  Some women have medical problems that increase the chance of getting cervical cancer. Talk to your caregiver about these problems. It is especially important to talk to your caregiver if a new problem develops soon after your last Pap test. In these cases, your caregiver may recommend more frequent screening and Pap tests.  The above recommendations are the same for women who have or have not gotten the vaccine for human papillomavirus  (HPV).  If you had a hysterectomy for a problem that was not cancer or a condition that could lead to cancer, then you no longer need Pap tests. Even if you no longer need a Pap test, a regular exam is a good idea to make sure no other problems are starting.  If you are between ages 74 and 10, and you have had normal Pap tests going back 10 years, you no longer need Pap tests. Even if you no longer need a Pap test, a regular exam is a good idea to make sure no other problems are starting.  If you have had past treatment for cervical cancer or a condition that could lead to cancer, you need Pap tests and screening for cancer for at least 20 years after your treatment.  If Pap tests have been discontinued, risk factors (such as a new sexual partner) need to be reassessed to determine if screening should be resumed.  The HPV test is an additional test that  may be used for cervical cancer screening. The HPV test looks for the virus that can cause the cell changes on the cervix. The cells collected during the Pap test can be tested for HPV. The HPV test could be used to screen women aged 49 years and older, and should be used in women of any age who have unclear Pap test results. After the age of 40, women should have HPV testing at the same frequency as a Pap test.  Colorectal cancer can be detected and often prevented. Most routine colorectal cancer screening begins at the age of 9 and continues through age 72. However, your caregiver may recommend screening at an earlier age if you have risk factors for colon cancer. On a yearly basis, your caregiver may provide home test kits to check for hidden blood in the stool. Use of a small camera at the end of a tube, to directly examine the colon (sigmoidoscopy or colonoscopy), can detect the earliest forms of colorectal cancer. Talk to your caregiver about this at age 36, when routine screening begins. Direct examination of the colon should be repeated every 5  to 10 years through age 29, unless early forms of pre-cancerous polyps or small growths are found.  Hepatitis C blood testing is recommended for all people born from [birth year] through 1965 and any individual with known risks for hepatitis C.  Practice safe sex. Use condoms and avoid high-risk sexual practices to reduce the spread of sexually transmitted infections (STIs). STIs include gonorrhea, chlamydia, syphilis, trichomonas, herpes, HPV, and human immunodeficiency virus (HIV). Herpes, HIV, and HPV are viral illnesses that have no cure. They can result in disability, cancer, and death. Sexually active women aged 83 and younger should be checked for chlamydia. Older women with new or multiple partners should also be tested for chlamydia. Testing for other STIs is recommended if you are sexually active and at increased risk.  Osteoporosis is a disease in which the bones lose minerals and strength with aging. This can result in serious bone fractures. The risk of osteoporosis can be identified using a bone density scan. Women ages 43 and over and women at risk for fractures or osteoporosis should discuss screening with their caregivers. Ask your caregiver whether you should take a calcium supplement or vitamin D to reduce the rate of osteoporosis.  Menopause can be associated with physical symptoms and risks. Hormone replacement therapy is available to decrease symptoms and risks. You should talk to your caregiver about whether hormone replacement therapy is right for you.  Use sunscreen with sun protection factor (SPF) of 30 or more. Apply sunscreen liberally and repeatedly throughout the day. You should seek shade when your shadow is shorter than you. Protect yourself by wearing long sleeves, pants, a wide-brimmed hat, and sunglasses year round, whenever you are outdoors.  Once a month, do a whole body skin exam, using a mirror to look at the skin on your back. Notify your caregiver of new moles, moles that  have irregular borders, moles that are larger than a pencil eraser, or moles that have changed in shape or color.  Stay current with required immunizations.  Influenza. You need a dose every fall (or winter). The composition of the flu vaccine changes each year, so being vaccinated once is not enough.  Pneumococcal polysaccharide. You need 1 to 2 doses if you smoke cigarettes or if you have certain chronic medical conditions. You need 1 dose at age 66 (or older) if you have never  been vaccinated.  Tetanus, diphtheria, pertussis (Tdap, Td). Get 1 dose of Tdap vaccine if you are younger than age 25, are over 77 and have contact with an infant, are a Research scientist (physical sciences), are pregnant, or simply want to be protected from whooping cough. After that, you need a Td booster dose every 10 years. Consult your caregiver if you have not had at least 3 tetanus and diphtheria-containing shots sometime in your life or have a deep or dirty wound.  HPV. You need this vaccine if you are a woman age 23 or younger. The vaccine is given in 3 doses over 6 months.  Measles, mumps, rubella (MMR). You need at least 1 dose of MMR if you were born in 1957 or later. You may also need a second dose.  Meningococcal. If you are age 73 to 36 and a first-year college student living in a residence hall, or have one of several medical conditions, you need to get vaccinated against meningococcal disease. You may also need additional booster doses.  Zoster (shingles). If you are age 17 or older, you should get this vaccine.  Varicella (chickenpox). If you have never had chickenpox or you were vaccinated but received only 1 dose, talk to your caregiver to find out if you need this vaccine.  Hepatitis A. You need this vaccine if you have a specific risk factor for hepatitis A virus infection or you simply wish to be protected from this disease. The vaccine is usually given as 2 doses, 6 to 18 months apart.  Hepatitis B. You need this  vaccine if you have a specific risk factor for hepatitis B virus infection or you simply wish to be protected from this disease. The vaccine is given in 3 doses, usually over 6 months. Preventive Services / Frequency Ages 69 to 51  Blood pressure check.** / Every 1 to 2 years.  Lipid and cholesterol check.** / Every 5 years beginning at age 26.  Clinical breast exam.** / Every 3 years for women in their 43s and 30s.  Pap test.** / Every 2 years from ages 10 through 24. Every 3 years starting at age 21 through age 67 or 24 with a history of 3 consecutive normal Pap tests.  HPV screening.** / Every 3 years from ages 19 through ages 74 to 6 with a history of 3 consecutive normal Pap tests.  Hepatitis C blood test.** / For any individual with known risks for hepatitis C.  Skin self-exam. / Monthly.  Influenza immunization.** / Every year.  Pneumococcal polysaccharide immunization.** / 1 to 2 doses if you smoke cigarettes or if you have certain chronic medical conditions.  Tetanus, diphtheria, pertussis (Tdap, Td) immunization. / A one-time dose of Tdap vaccine. After that, you need a Td booster dose every 10 years.  HPV immunization. / 3 doses over 6 months, if you are 38 and younger.  Measles, mumps, rubella (MMR) immunization. / You need at least 1 dose of MMR if you were born in 1957 or later. You may also need a second dose.  Meningococcal immunization. / 1 dose if you are age 59 to 67 and a first-year college student living in a residence hall, or have one of several medical conditions, you need to get vaccinated against meningococcal disease. You may also need additional booster doses.  Varicella immunization.** / Consult your caregiver.  Hepatitis A immunization.** / Consult your caregiver. 2 doses, 6 to 18 months apart.  Hepatitis B immunization.** / Consult your caregiver. 3  doses usually over 6 months. Ages 32 to 25  Blood pressure check.** / Every 1 to 2 years.  Lipid  and cholesterol check.** / Every 5 years beginning at age 52.  Clinical breast exam.** / Every year after age 104.  Mammogram.** / Every year beginning at age 53 and continuing for as long as you are in good health. Consult with your caregiver.  Pap test.** / Every 3 years starting at age 36 through age 77 or 82 with a history of 3 consecutive normal Pap tests.  HPV screening.** / Every 3 years from ages 50 through ages 64 to 2 with a history of 3 consecutive normal Pap tests.  Fecal occult blood test (FOBT) of stool. / Every year beginning at age 56 and continuing until age 59. You may not need to do this test if you get a colonoscopy every 10 years.  Flexible sigmoidoscopy or colonoscopy.** / Every 5 years for a flexible sigmoidoscopy or every 10 years for a colonoscopy beginning at age 53 and continuing until age 36.  Hepatitis C blood test.** / For all people born from [birth year] through 1967 and any individual with known risks for hepatitis C.  Skin self-exam. / Monthly.  Influenza immunization.** / Every year.  Pneumococcal polysaccharide immunization.** / 1 to 2 doses if you smoke cigarettes or if you have certain chronic medical conditions.  Tetanus, diphtheria, pertussis (Tdap, Td) immunization.** / A one-time dose of Tdap vaccine. After that, you need a Td booster dose every 10 years.  Measles, mumps, rubella (MMR) immunization. / You need at least 1 dose of MMR if you were born in 1957 or later. You may also need a second dose.  Varicella immunization.** / Consult your caregiver.  Meningococcal immunization.** / Consult your caregiver.  Hepatitis A immunization.** / Consult your caregiver. 2 doses, 6 to 18 months apart.  Hepatitis B immunization.** / Consult your caregiver. 3 doses, usually over 6 months. Ages 67 and over  Blood pressure check.** / Every 1 to 2 years.  Lipid and cholesterol check.** / Every 5 years beginning at age 59.  Clinical breast exam.** / Every year  after age 58.  Mammogram.** / Every year beginning at age 58 and continuing for as long as you are in good health. Consult with your caregiver.  Pap test.** / Every 3 years starting at age 42 through age 27 or 14 with a 3 consecutive normal Pap tests. Testing can be stopped between 65 and 70 with 3 consecutive normal Pap tests and no abnormal Pap or HPV tests in the past 10 years.  HPV screening.** / Every 3 years from ages 76 through ages 53 or 64 with a history of 3 consecutive normal Pap tests. Testing can be stopped between 65 and 70 with 3 consecutive normal Pap tests and no abnormal Pap or HPV tests in the past 10 years.  Fecal occult blood test (FOBT) of stool. / Every year beginning at age 76 and continuing until age 5. You may not need to do this test if you get a colonoscopy every 10 years.  Flexible sigmoidoscopy or colonoscopy.** / Every 5 years for a flexible sigmoidoscopy or every 10 years for a colonoscopy beginning at age 73 and continuing until age 7.  Hepatitis C blood test.** / For all people born from [birth year] through 1967 and any individual with known risks for hepatitis C.  Osteoporosis screening.** / A one-time screening for women ages 46 and over and women at risk  for fractures or osteoporosis.  Skin self-exam. / Monthly.  Influenza immunization.** / Every year.  Pneumococcal polysaccharide immunization.** / 1 dose at age 12 (or older) if you have never been vaccinated.  Tetanus, diphtheria, pertussis (Tdap, Td) immunization. / A one-time dose of Tdap vaccine if you are over 65 and have contact with an infant, are a Research scientist (physical sciences), or simply want to be protected from whooping cough. After that, you need a Td booster dose every 10 years.  Varicella immunization.** / Consult your caregiver.  Meningococcal immunization.** / Consult your caregiver.  Hepatitis A immunization.** / Consult your caregiver. 2 doses, 6 to 18 months apart.  Hepatitis B immunization.** /  Check with your caregiver. 3 doses, usually over 6 months. ** Family history and personal history of risk and conditions may change your caregiver's recommendations. Document Released: 01/07/2002 Document Revised: 02/03/2012 Document Reviewed: 04/08/2011 [employer] Patient Information 2013 [place], [place].

## 2012-10-27 NOTE — Progress Notes (Signed)
Chief Complaint  Patient presents with  . Annual Exam  . Hyperlipidemia  . Hypothyroidism    HPI: Patient comes in today for Preventive Health Care visit   ROS:  GEN/ HEENT: No fever, significant weight changes sweats headaches vision problems hearing changes, CV/ PULM; No chest pain shortness of breath cough, syncope,edema  change in exercise tolerance. GI /GU: No adominal pain, vomiting, change in bowel habits. No blood in the stool. No significant GU symptoms. SKIN/HEME: ,no acute skin rashes suspicious lesions or bleeding. No lymphadenopathy, nodules, masses.  NEURO/ PSYCH:  No neurologic signs such as weakness numbness. No depression anxiety. IMM/ Allergy: No unusual infections.  Allergy .   REST of 12 system review negative except as per HPI   Past Medical History  Diagnosis Date  . High blood pressure     readings  . High cholesterol   . Migraine with aura     auras  . Thyroid disease   . Stroke     on hrt double vision- possible blood clot on brain; Dr. Lesia Sago at [facility]   . Pneumonia 2007    and flu hospitalized   . Hx of varicella   . Other and unspecified hyperlipidemia 04/27/2012    Has been on meds for a number of years .  Low dose crestor .  Baseline reported as 280/     Family History  Problem Relation Age of Onset  . Adopted: Yes  . Cancer Maternal Aunt     breast    History   Social History  . Marital Status: Married    Spouse Name: N/A    Number of Children: N/A  . Years of Education: N/A   Social History Main Topics  . Smoking status: Never Smoker   . Smokeless tobacco: Never Used  . Alcohol Use: No  . Drug Use: No  . Sexually Active: Yes   Other Topics Concern  . 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 Neg tadSome caffiene Seatbelts no ets fa stored G1P1Physically  active  5 yo grandaughter     EXAM:  BP 124/78  Pulse 64  Temp 98.3 F (36.8 C) (Oral)  Ht 5' 4.75" (1.645 m)  Wt 131 lb (59.421 kg)  BMI 21.97 kg/m2  SpO2 99%  LMP 04/25/2009  Body mass index is 21.97 kg/(m^2).  Physical Exam: Vital signs reviewed [id number] is a well-developed well-nourished alert cooperative   female who appears her stated age in no acute distress.  HEENT: normocephalic atraumatic , Eyes: PERRL EOM's full, conjunctiva clear, Nares: paten,t no deformity discharge or tenderness., Ears: no deformity EAC's clear TMs with normal landmarks. Mouth: clear OP, no lesions, edema.  Moist mucous membranes. Dentition in adequate repair. NECK: supple without masses, thyromegaly or bruits. Breast: normal by inspection . No dimpling, discharge, masses, tenderness or discharge . CHEST/PULM:  Clear to auscultation and percussion breath sounds equal no wheeze , rales or rhonchi. No chest wall deformities or tenderness. CV: PMI is nondisplaced, S1 S2 no gallops, murmurs, rubs. Peripheral pulses are full without delay.No JVD .  ABDOMEN: Bowel sounds normal nontender  No guard or rebound, no hepato splenomegal no CVA tenderness.  No hernia. Extremtities:  No clubbing cyanosis or edema, no acute joint swelling or redness no focal atrophy NEURO:  Oriented x3, cranial nerves 3-12  appear to be intact, no obvious focal weakness,gait within normal limits no abnormal reflexes or asymmetrical SKIN: No acute rashes normal turgor, color, no bruising or petechiae. PSYCH: Oriented, good eye contact, no obvious depression anxiety, cognition and judgment appear normal. LN: no cervical axillary inguinal adenopathy  Lab Results  Component Value Date   WBC 5.0 10/20/2012   HGB 13.2 10/20/2012   HCT 40.3 10/20/2012   PLT 218.0 10/20/2012   GLUCOSE 89 10/20/2012   CHOL 161 10/20/2012   TRIG 74.0 10/20/2012   HDL 72.80 10/20/2012   LDLCALC 73 10/20/2012   ALT 30 10/20/2012   AST 26 10/20/2012    NA 139 10/20/2012   K 3.8 10/20/2012   CL 103 10/20/2012   CREATININE 0.8 10/20/2012   BUN 13 10/20/2012   CO2 28 10/20/2012   TSH 0.28* 10/20/2012   INR 0.97 03/01/2010    ASSESSMENT AND PLAN:  Discussed the following assessment and plan:  1. Visit for preventive health examination   2. Other and unspecified hyperlipidemia    about 248  on crestor 5 per day equivalent  or such dsic cost of med doing ok so continue adjust as appropriate  3. HYPOTHYROIDISM    appears over suppressed change to 75 generic and recheck   4. HYPERTENSION, UNSPECIFIED   5. CEREBROVASCULAR ACCIDENT, HX OF hormone therapy    on asa and crestor.   6. Menopausal hot flushes    Preventive Health Care Counseled regarding healthy nutrition, exercise, sleep, injury prevention, calcium vit d and healthy weight . Declined tdap  Pap next year   Patient Instructions  Continue lifestyle intervention healthy eating and exercise . Change thyroid dose to 75 and recheck tsh lab in 2 months . If ok then continue. And  preventive visit in a year.   Plan for pap smear to be done next year.  Consider updating tdap booster vaccine.   Preventive Care for Adults, Female A healthy lifestyle and preventive care can promote health and wellness. Preventive health guidelines for women include the following key practices.  A routine yearly physical is a good way to check with your caregiver about your health and preventive screening. It is a chance to share any concerns and updates on your health, and to receive a thorough exam.  Visit your dentist for a routine exam and preventive care every 6 months. Brush your teeth twice a day and floss once a day. Good oral hygiene prevents tooth decay and gum disease.  The frequency of eye exams is based on your age, health, family medical history, use of contact lenses, and other factors. Follow your caregiver's recommendations for frequency of eye exams.  Eat a healthy diet. Foods like  vegetables, fruits, whole grains, low-fat dairy products, and lean protein foods contain the nutrients you need without too many calories. Decrease your intake of foods high in solid fats, added sugars, and salt. Eat the right amount of calories for you.Get information about a proper diet from your caregiver, if necessary.  Regular physical exercise is one of the most important things you can do for your health. Most adults should get at least 150 minutes of moderate-intensity exercise (any activity that increases your heart rate and causes you to sweat) each week. In addition, most adults need muscle-strengthening exercises on 2 or more days a week.  Maintain a healthy weight. The body mass index (BMI) is a screening tool to identify possible weight problems. It provides an estimate of body fat based  on height and weight. Your caregiver can help determine your BMI, and can help you achieve or maintain a healthy weight.For adults 20 years and older:  A BMI below 18.5 is considered underweight.  A BMI of 18.5 to 24.9 is normal.  A BMI of 25 to 29.9 is considered overweight.  A BMI of 30 and above is considered obese.  Maintain normal blood lipids and cholesterol levels by exercising and minimizing your intake of saturated fat. Eat a balanced diet with plenty of fruit and vegetables. Blood tests for lipids and cholesterol should begin at age 71 and be repeated every 5 years. If your lipid or cholesterol levels are high, you are over 50, or you are at high risk for heart disease, you may need your cholesterol levels checked more frequently.Ongoing high lipid and cholesterol levels should be treated with medicines if diet and exercise are not effective.  If you smoke, find out from your caregiver how to quit. If you do not use tobacco, do not start.  If you are pregnant, do not drink alcohol. If you are breastfeeding, be very cautious about drinking alcohol. If you are not pregnant and choose to  drink alcohol, do not exceed 1 drink per day. One drink is considered to be 12 ounces (355 mL) of beer, 5 ounces (148 mL) of wine, or 1.5 ounces (44 mL) of liquor.  Avoid use of street drugs. Do not share needles with anyone. Ask for help if you need support or instructions about stopping the use of drugs.  High blood pressure causes heart disease and increases the risk of stroke. Your blood pressure should be checked at least every 1 to 2 years. Ongoing high blood pressure should be treated with medicines if weight loss and exercise are not effective.  If you are 77 to 58 years old, ask your caregiver if you should take aspirin to prevent strokes.  Diabetes screening involves taking a blood sample to check your fasting blood sugar level. This should be done once every 3 years, after age 50, if you are within normal weight and without risk factors for diabetes. Testing should be considered at a younger age or be carried out more frequently if you are overweight and have at least 1 risk factor for diabetes.  Breast cancer screening is essential preventive care for women. You should practice "breast self-awareness." This means understanding the normal appearance and feel of your breasts and may include breast self-examination. Any changes detected, no matter how small, should be reported to a caregiver. Women in their 7s and 30s should have a clinical breast exam (CBE) by a caregiver as part of a regular health exam every 1 to 3 years. After age 47, women should have a CBE every year. Starting at age 74, women should consider having a mammography (breast X-ray test) every year. Women who have a family history of breast cancer should talk to their caregiver about genetic screening. Women at a high risk of breast cancer should talk to their caregivers about having magnetic resonance imaging (MRI) and a mammography every year.  The Pap test is a screening test for cervical cancer. A Pap test can show cell  changes on the cervix that might become cervical cancer if left untreated. A Pap test is a procedure in which cells are obtained and examined from the lower end of the uterus (cervix).  Women should have a Pap test starting at age 61.  Between ages 21 and 56, Pap tests  should be repeated every 2 years.  Beginning at age 1, you should have a Pap test every 3 years as long as the past 3 Pap tests have been normal.  Some women have medical problems that increase the chance of getting cervical cancer. Talk to your caregiver about these problems. It is especially important to talk to your caregiver if a new problem develops soon after your last Pap test. In these cases, your caregiver may recommend more frequent screening and Pap tests.  The above recommendations are the same for women who have or have not gotten the vaccine for human papillomavirus (HPV).  If you had a hysterectomy for a problem that was not cancer or a condition that could lead to cancer, then you no longer need Pap tests. Even if you no longer need a Pap test, a regular exam is a good idea to make sure no other problems are starting.  If you are between ages 21 and 39, and you have had normal Pap tests going back 10 years, you no longer need Pap tests. Even if you no longer need a Pap test, a regular exam is a good idea to make sure no other problems are starting.  If you have had past treatment for cervical cancer or a condition that could lead to cancer, you need Pap tests and screening for cancer for at least 20 years after your treatment.  If Pap tests have been discontinued, risk factors (such as a new sexual partner) need to be reassessed to determine if screening should be resumed.  The HPV test is an additional test that may be used for cervical cancer screening. The HPV test looks for the virus that can cause the cell changes on the cervix. The cells collected during the Pap test can be tested for HPV. The HPV test could  be used to screen women aged 31 years and older, and should be used in women of any age who have unclear Pap test results. After the age of 84, women should have HPV testing at the same frequency as a Pap test.  Colorectal cancer can be detected and often prevented. Most routine colorectal cancer screening begins at the age of 98 and continues through age 94. However, your caregiver may recommend screening at an earlier age if you have risk factors for colon cancer. On a yearly basis, your caregiver may provide home test kits to check for hidden blood in the stool. Use of a small camera at the end of a tube, to directly examine the colon (sigmoidoscopy or colonoscopy), can detect the earliest forms of colorectal cancer. Talk to your caregiver about this at age 45, when routine screening begins. Direct examination of the colon should be repeated every 5 to 10 years through age 43, unless early forms of pre-cancerous polyps or small growths are found.  Hepatitis C blood testing is recommended for all people born from [birth year] through 1965 and any individual with known risks for hepatitis C.  Practice safe sex. Use condoms and avoid high-risk sexual practices to reduce the spread of sexually transmitted infections (STIs). STIs include gonorrhea, chlamydia, syphilis, trichomonas, herpes, HPV, and human immunodeficiency virus (HIV). Herpes, HIV, and HPV are viral illnesses that have no cure. They can result in disability, cancer, and death. Sexually active women aged 77 and younger should be checked for chlamydia. Older women with new or multiple partners should also be tested for chlamydia. Testing for other STIs is recommended if you are sexually  active and at increased risk.  Osteoporosis is a disease in which the bones lose minerals and strength with aging. This can result in serious bone fractures. The risk of osteoporosis can be identified using a bone density scan. Women ages 27 and over and women at risk for  fractures or osteoporosis should discuss screening with their caregivers. Ask your caregiver whether you should take a calcium supplement or vitamin D to reduce the rate of osteoporosis.  Menopause can be associated with physical symptoms and risks. Hormone replacement therapy is available to decrease symptoms and risks. You should talk to your caregiver about whether hormone replacement therapy is right for you.  Use sunscreen with sun protection factor (SPF) of 30 or more. Apply sunscreen liberally and repeatedly throughout the day. You should seek shade when your shadow is shorter than you. Protect yourself by wearing long sleeves, pants, a wide-brimmed hat, and sunglasses year round, whenever you are outdoors.  Once a month, do a whole body skin exam, using a mirror to look at the skin on your back. Notify your caregiver of new moles, moles that have irregular borders, moles that are larger than a pencil eraser, or moles that have changed in shape or color.  Stay current with required immunizations.  Influenza. You need a dose every fall (or winter). The composition of the flu vaccine changes each year, so being vaccinated once is not enough.  Pneumococcal polysaccharide. You need 1 to 2 doses if you smoke cigarettes or if you have certain chronic medical conditions. You need 1 dose at age 26 (or older) if you have never been vaccinated.  Tetanus, diphtheria, pertussis (Tdap, Td). Get 1 dose of Tdap vaccine if you are younger than age 5, are over 1 and have contact with an infant, are a Research scientist (physical sciences), are pregnant, or simply want to be protected from whooping cough. After that, you need a Td booster dose every 10 years. Consult your caregiver if you have not had at least 3 tetanus and diphtheria-containing shots sometime in your life or have a deep or dirty wound.  HPV. You need this vaccine if you are a woman age 7 or younger. The vaccine is given in 3 doses over 6 months.  Measles,  mumps, rubella (MMR). You need at least 1 dose of MMR if you were born in 1957 or later. You may also need a second dose.  Meningococcal. If you are age 13 to 23 and a first-year college student living in a residence hall, or have one of several medical conditions, you need to get vaccinated against meningococcal disease. You may also need additional booster doses.  Zoster (shingles). If you are age 33 or older, you should get this vaccine.  Varicella (chickenpox). If you have never had chickenpox or you were vaccinated but received only 1 dose, talk to your caregiver to find out if you need this vaccine.  Hepatitis A. You need this vaccine if you have a specific risk factor for hepatitis A virus infection or you simply wish to be protected from this disease. The vaccine is usually given as 2 doses, 6 to 18 months apart.  Hepatitis B. You need this vaccine if you have a specific risk factor for hepatitis B virus infection or you simply wish to be protected from this disease. The vaccine is given in 3 doses, usually over 6 months. Preventive Services / Frequency Ages 74 to 91  Blood pressure check.** / Every 1 to 2 years.  Lipid and cholesterol check.** / Every 5 years beginning at age 58.  Clinical breast exam.** / Every 3 years for women in their 20s and 30s.  Pap test.** / Every 2 years from ages 35 through 16. Every 3 years starting at age 57 through age 50 or 55 with a history of 3 consecutive normal Pap tests.  HPV screening.** / Every 3 years from ages 64 through ages 35 to 92 with a history of 3 consecutive normal Pap tests.  Hepatitis C blood test.** / For any individual with known risks for hepatitis C.  Skin self-exam. / Monthly.  Influenza immunization.** / Every year.  Pneumococcal polysaccharide immunization.** / 1 to 2 doses if you smoke cigarettes or if you have certain chronic medical conditions.  Tetanus, diphtheria, pertussis (Tdap, Td) immunization. / A one-time  dose of Tdap vaccine. After that, you need a Td booster dose every 10 years.  HPV immunization. / 3 doses over 6 months, if you are 48 and younger.  Measles, mumps, rubella (MMR) immunization. / You need at least 1 dose of MMR if you were born in 1957 or later. You may also need a second dose.  Meningococcal immunization. / 1 dose if you are age 91 to 54 and a first-year college student living in a residence hall, or have one of several medical conditions, you need to get vaccinated against meningococcal disease. You may also need additional booster doses.  Varicella immunization.** / Consult your caregiver.  Hepatitis A immunization.** / Consult your caregiver. 2 doses, 6 to 18 months apart.  Hepatitis B immunization.** / Consult your caregiver. 3 doses usually over 6 months. Ages 69 to 61  Blood pressure check.** / Every 1 to 2 years.  Lipid and cholesterol check.** / Every 5 years beginning at age 25.  Clinical breast exam.** / Every year after age 59.  Mammogram.** / Every year beginning at age 3 and continuing for as long as you are in good health. Consult with your caregiver.  Pap test.** / Every 3 years starting at age 67 through age 29 or 63 with a history of 3 consecutive normal Pap tests.  HPV screening.** / Every 3 years from ages 20 through ages 52 to 78 with a history of 3 consecutive normal Pap tests.  Fecal occult blood test (FOBT) of stool. / Every year beginning at age 57 and continuing until age 53. You may not need to do this test if you get a colonoscopy every 10 years.  Flexible sigmoidoscopy or colonoscopy.** / Every 5 years for a flexible sigmoidoscopy or every 10 years for a colonoscopy beginning at age 54 and continuing until age 71.  Hepatitis C blood test.** / For all people born from [birth year] through 1967 and any individual with known risks for hepatitis C.  Skin self-exam. / Monthly.  Influenza immunization.** / Every year.  Pneumococcal polysaccharide  immunization.** / 1 to 2 doses if you smoke cigarettes or if you have certain chronic medical conditions.  Tetanus, diphtheria, pertussis (Tdap, Td) immunization.** / A one-time dose of Tdap vaccine. After that, you need a Td booster dose every 10 years.  Measles, mumps, rubella (MMR) immunization. / You need at least 1 dose of MMR if you were born in 1957 or later. You may also need a second dose.  Varicella immunization.** / Consult your caregiver.  Meningococcal immunization.** / Consult your caregiver.  Hepatitis A immunization.** / Consult your caregiver. 2 doses, 6 to 18 months apart.  Hepatitis  B immunization.** / Consult your caregiver. 3 doses, usually over 6 months. Ages 21 and over  Blood pressure check.** / Every 1 to 2 years.  Lipid and cholesterol check.** / Every 5 years beginning at age 38.  Clinical breast exam.** / Every year after age 30.  Mammogram.** / Every year beginning at age 23 and continuing for as long as you are in good health. Consult with your caregiver.  Pap test.** / Every 3 years starting at age 32 through age 35 or 83 with a 3 consecutive normal Pap tests. Testing can be stopped between 65 and 70 with 3 consecutive normal Pap tests and no abnormal Pap or HPV tests in the past 10 years.  HPV screening.** / Every 3 years from ages 89 through ages 60 or 105 with a history of 3 consecutive normal Pap tests. Testing can be stopped between 65 and 70 with 3 consecutive normal Pap tests and no abnormal Pap or HPV tests in the past 10 years.  Fecal occult blood test (FOBT) of stool. / Every year beginning at age 3 and continuing until age 50. You may not need to do this test if you get a colonoscopy every 10 years.  Flexible sigmoidoscopy or colonoscopy.** / Every 5 years for a flexible sigmoidoscopy or every 10 years for a colonoscopy beginning at age 57 and continuing until age 44.  Hepatitis C blood test.** / For all people born from [birth year] through 1967 and  any individual with known risks for hepatitis C.  Osteoporosis screening.** / A one-time screening for women ages 31 and over and women at risk for fractures or osteoporosis.  Skin self-exam. / Monthly.  Influenza immunization.** / Every year.  Pneumococcal polysaccharide immunization.** / 1 dose at age 12 (or older) if you have never been vaccinated.  Tetanus, diphtheria, pertussis (Tdap, Td) immunization. / A one-time dose of Tdap vaccine if you are over 65 and have contact with an infant, are a Research scientist (physical sciences), or simply want to be protected from whooping cough. After that, you need a Td booster dose every 10 years.  Varicella immunization.** / Consult your caregiver.  Meningococcal immunization.** / Consult your caregiver.  Hepatitis A immunization.** / Consult your caregiver. 2 doses, 6 to 18 months apart.  Hepatitis B immunization.** / Check with your caregiver. 3 doses, usually over 6 months. ** Family history and personal history of risk and conditions may change your caregiver's recommendations. Document Released: 01/07/2002 Document Revised: 02/03/2012 Document Reviewed: 04/08/2011 [facility] Patient Information 2013 [place], [place].      Neta Mends. Jaryd Drew M.D.

## 2012-10-31 ENCOUNTER — Encounter: Payer: Self-pay | Admitting: Internal Medicine

## 2012-11-04 ENCOUNTER — Other Ambulatory Visit: Payer: Self-pay | Admitting: Internal Medicine

## 2012-11-04 MED ORDER — ATORVASTATIN CALCIUM 10 MG PO TABS: 10.0000 mg | ORAL_TABLET | Freq: Every day | ORAL | Status: DC

## 2012-12-21 ENCOUNTER — Ambulatory Visit (INDEPENDENT_AMBULATORY_CARE_PROVIDER_SITE_OTHER): Payer: Managed Care, Other (non HMO) | Admitting: Internal Medicine

## 2012-12-21 ENCOUNTER — Encounter: Payer: Self-pay | Admitting: Internal Medicine

## 2012-12-21 VITALS — BP 130/94 | HR 78 | Temp 97.7°F | Wt 126.0 lb

## 2012-12-21 DIAGNOSIS — Z8679 Personal history of other diseases of the circulatory system: Secondary | ICD-10-CM

## 2012-12-21 DIAGNOSIS — L299 Pruritus, unspecified: Secondary | ICD-10-CM

## 2012-12-21 DIAGNOSIS — R21 Rash and other nonspecific skin eruption: Secondary | ICD-10-CM

## 2012-12-21 DIAGNOSIS — E039 Hypothyroidism, unspecified: Secondary | ICD-10-CM

## 2012-12-21 MED ORDER — PREDNISONE 20 MG PO TABS: ORAL_TABLET | ORAL | Status: DC

## 2012-12-21 MED ORDER — PERMETHRIN 5 % EX CREA: TOPICAL_CREAM | Freq: Once | CUTANEOUS | Status: DC

## 2012-12-21 NOTE — Progress Notes (Signed)
Chief Complaint  Patient presents with  . Rash    First noticed on Thursday.  Has tried Benadryl and OTC Hyrocortisone cream.    HPI: Patient comes in today for SDA for  new problem evaluation. Onset about 4 days ago of itching that began on the lower back and then on the dorsum of her hands and then began itching all over worse on the top of her feet. She has a feet bumps on the lower back but really none on the hands but is itching so badly at night is very uncomfortable. She denies any new topicals medicines or history of same there is someone who has had a rash but not her husband.  Benadryl 2  Kept awake and not helping.    Used castorol  Very itchy  ROS: See pertinent positives and negatives per HPI. No chest pain shortness of breath no hives no fevers. No new soaps or topicals  Past Medical History  Diagnosis Date  . High blood pressure     readings  . High cholesterol   . Migraine with aura     auras  . Thyroid disease   . Stroke     on hrt double vision- possible blood clot on brain; Dr. Lesia Sago at [facility]   . Pneumonia 2007    and flu hospitalized   . Hx of varicella   . Other and unspecified hyperlipidemia 04/27/2012    Has been on meds for a number of years .  Low dose crestor .  Baseline reported as 280/     Family History  Problem Relation Age of Onset  . Adopted: Yes  . Cancer Maternal Aunt     breast    History   Social History  . Marital Status: Married    Spouse Name: N/A    Number of Children: N/A  . Years of Education: N/A   Social History Main Topics  . Smoking status: Never Smoker   . Smokeless tobacco: Never Used  . Alcohol Use: No  . Drug Use: No  . Sexually Active: Yes   Other Topics Concern  . 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 Neg tadSome caffiene Seatbelts no ets  fa stored G1P1Physically active  5 yo grandaughter     Outpatient Encounter Prescriptions as of 12/21/2012  Medication Sig Dispense Refill  . aspirin 81 MG tablet Take 81 mg by mouth daily.      Marland Kitchen atorvastatin (LIPITOR) 10 MG tablet Take 5 mg by mouth daily.      . hydrochlorothiazide (HYDRODIURIL) 25 MG tablet Take 25 mg by mouth daily.      Marland Kitchen levothyroxine (SYNTHROID, LEVOTHROID) 75 MCG tablet Take 1 tablet (75 mcg total) by mouth daily.  30 tablet  5  . potassium chloride (K-DUR,KLOR-CON) 10 MEQ tablet Take 10 mEq by mouth daily.      . [DISCONTINUED] atorvastatin (LIPITOR) 10 MG tablet Take 1 tablet (10 mg total) by mouth daily.  30 tablet  3  . permethrin (ELIMITE) 5 % cream Apply topically once. Wash after 8-12 hours  60 g  1  . predniSONE (DELTASONE) 20 MG tablet Take 3,3,3,2,2,2,1,1,1, 1/.2 1./2 1/.2 pills qd  24 tablet  0  . [DISCONTINUED] calcium carbonate, dosed in mg elemental calcium, 1250 MG/5ML Take 1,000 mg of elemental calcium  by mouth daily.        EXAM:  BP 130/94  Pulse 78  Temp 97.7 F (36.5 C) (Oral)  Wt 126 lb (57.153 kg)  SpO2 96%  LMP 04/25/2009  There is no height on file to calculate BMI.  GENERAL: vitals reviewed and listed above, alert, oriented, appears well hydrated and in no acute distress but is uncomfortable with scratching. Otherwise she looks well  HEENT: atraumatic, conjunctiva  clear, no obvious abnormalities on inspection of external nose and ears OP : no lesion edema or exudate  NECK: no obvious masses on inspection palpation  LUNGS: clear to auscultation bilaterally, no wheezes, rales or rhonchi, good air movement CV: HRRR, no clubbing cyanosis or  peripheral edema nl cap refill  Skin somewhat dry dorsum of hands but no acute rash lower spine back area with excoriated tiny bumps without burrows. There a few bumps on the anterior abdomen. Again no burrows. Scalp is itching but I see no rash. MS: moves all extremities without noticeable focal   abnormality  PSYCH: pleasant and cooperative, no obvious depression or anxiety  ASSESSMENT AND PLAN:  Discussed the following assessment and plan:  1. Itching   2. Rash    Pretty minor except for the extent of her itching. Although there are no burrows because of the severity would consider scabies and also treat with steroid  3. CEREBROVASCULAR ACCIDENT, HX OF   4. HYPOTHYROIDISM    uncertain cause impaired treatment in case she did have exposure to a child with a few bumps. Discussed differential diagnosis.  -Patient advised to return or notify health care team  if symptoms worsen or persist or new concerns arise.  Patient Instructions  Uncertain why you have this titchy rash    consider  Scabies as a cause because  Of the intensity of the itching.     In the location  Can still take an antihistamine   Incase allergic reaction such as Zyrtec generic   We'll add prednisone treatment and a topical medicine in case this could be scabies. Avoid scratching use cool compresses if needed   Scabies Scabies are small bugs (mites) that burrow under the skin and cause red bumps and severe itching. These bugs can only be seen with a microscope. Scabies are highly contagious. They can spread easily from person to person by direct contact. They are also spread through sharing clothing or linens that have the scabies mites living in them. It is not unusual for an entire family to become infected through shared towels, clothing, or bedding.  HOME CARE INSTRUCTIONS   Your caregiver may prescribe a cream or lotion to kill the mites. If cream is prescribed, massage the cream into the entire body from the neck to the bottom of both feet. Also massage the cream into the scalp and face if your child is less than 47 year old. Avoid the eyes and mouth. Do not wash your hands after application.  Leave the cream on for 8 to 12 hours. Your child should bathe or shower after the 8 to 12 hour application period.  Sometimes it is helpful to apply the cream to your child right before bedtime.  One treatment is usually effective and will eliminate approximately 95% of infestations. For severe cases, your caregiver may decide to repeat the treatment in 1 week. Everyone in your household should be treated with one application of the cream.  New rashes or burrows should not appear within 24 to 48 hours  after successful treatment. However, the itching and rash may last for 2 to 4 weeks after successful treatment. Your caregiver may prescribe a medicine to help with the itching or to help the rash go away more quickly.  Scabies can live on clothing or linens for up to 3 days. All of your child's recently used clothing, towels, stuffed toys, and bed linens should be washed in hot water and then dried in a dryer for at least 20 minutes on high heat. Items that cannot be washed should be enclosed in a plastic bag for at least 3 days.  To help relieve itching, bathe your child in a cool bath or apply cool washcloths to the affected areas.  Your child may return to school after treatment with the prescribed cream. SEEK MEDICAL CARE IF:   The itching persists longer than 4 weeks after treatment.  The rash spreads or becomes infected. Signs of infection include red blisters or yellow-tan crust. Document Released: 11/11/2005 Document Revised: 02/03/2012 Document Reviewed: 03/22/2009 [facility] Patient Information 2013 [place], [place].   Itching Itching is a symptom that can be caused by many things. These include skin problems (including infections) as well as some internal diseases.  If the itching is affecting just one area of the body, it is most likely due to a common skin problem, such as:  Poison oak and poison ivy.  Contact dermatitis (skin irritation from a plant, chemicals, fiberglass, detergents, new cosmetic, new jewelry, or other substance).  Fungus (such as athlete's foot, jock itch, or  ringworm).  Head lice  Dandruff  Insect bite  Infection (such as Shingles or other virus infections). If the itching is all over (widespread), the possible causes are many. These include:   Dry skin or eczema  Heat rash  Hives  Liver disorders  Kidney disorders TREATMENT  Localized itching   Lubrication of the skin. Use an ointment or cream or other unperfumed moisturizers if the skin is dry. Apply frequently, especially after bathing.  Anti-itch medicines. These medications may help control the urge to scratch. Scratching always makes itching worse and increases the chance of getting an infection.  Cortisone creams and ointments. These help reduce the inflammation.  Antibiotics. Skin infections can cause itching. Topical or oral antibiotics may be needed for 10 to 20 days to get rid of an infection. If you can identify what caused the itching, avoid this substance in the future.  Widespread itching  The following measures may help to relieve itching regardless of the cause:   Wash the skin once with soap to remove irritants.  Bathe in tepid water with baking soda, cornstarch, or oatmeal.  Use calamine lotion (nonprescription) or a baking soda solution (1 teaspoon in 4 ounces of water on the skin).  Apply 1% hydrocortisone cream (no prescription needed). Do not use this if there might be a skin infection.  Avoid scratching.  Avoid itchy or tight-fitting clothes.  Avoid excessive heat, sweating, scented soaps, and swimming pools.  The lubricants, anti-itch medicines, etc. noted above may be helpful for controlling symptoms. SEEK MEDICAL CARE IF:   The itching becomes severe.  Your itch is not better after 1 week of treatment. Contact your caregiver to schedule further evaluation. Document Released: 11/11/2005 Document Revised: 02/03/2012 Document Reviewed: 05/01/2007 [facility] Patient Information 2013 [place], [place].      Neta Mends. Perina Salvaggio M.D.

## 2012-12-21 NOTE — Patient Instructions (Addendum)
Uncertain why you have this titchy rash    consider  Scabies as a cause because  Of the intensity of the itching.     In the location  Can still take an antihistamine   Incase allergic reaction such as Zyrtec generic   We'll add prednisone treatment and a topical medicine in case this could be scabies. Avoid scratching use cool compresses if needed   Scabies Scabies are small bugs (mites) that burrow under the skin and cause red bumps and severe itching. These bugs can only be seen with a microscope. Scabies are highly contagious. They can spread easily from person to person by direct contact. They are also spread through sharing clothing or linens that have the scabies mites living in them. It is not unusual for an entire family to become infected through shared towels, clothing, or bedding.  HOME CARE INSTRUCTIONS   Your caregiver may prescribe a cream or lotion to kill the mites. If cream is prescribed, massage the cream into the entire body from the neck to the bottom of both feet. Also massage the cream into the scalp and face if your child is less than 78 year old. Avoid the eyes and mouth. Do not wash your hands after application.  Leave the cream on for 8 to 12 hours. Your child should bathe or shower after the 8 to 12 hour application period. Sometimes it is helpful to apply the cream to your child right before bedtime.  One treatment is usually effective and will eliminate approximately 95% of infestations. For severe cases, your caregiver may decide to repeat the treatment in 1 week. Everyone in your household should be treated with one application of the cream.  New rashes or burrows should not appear within 24 to 48 hours after successful treatment. However, the itching and rash may last for 2 to 4 weeks after successful treatment. Your caregiver may prescribe a medicine to help with the itching or to help the rash go away more quickly.  Scabies can live on clothing or linens for up to  3 days. All of your child's recently used clothing, towels, stuffed toys, and bed linens should be washed in hot water and then dried in a dryer for at least 20 minutes on high heat. Items that cannot be washed should be enclosed in a plastic bag for at least 3 days.  To help relieve itching, bathe your child in a cool bath or apply cool washcloths to the affected areas.  Your child may return to school after treatment with the prescribed cream. SEEK MEDICAL CARE IF:   The itching persists longer than 4 weeks after treatment.  The rash spreads or becomes infected. Signs of infection include red blisters or yellow-tan crust. Document Released: 11/11/2005 Document Revised: 02/03/2012 Document Reviewed: 03/22/2009 [facility] Patient Information 2013 [place], [place].   Itching Itching is a symptom that can be caused by many things. These include skin problems (including infections) as well as some internal diseases.  If the itching is affecting just one area of the body, it is most likely due to a common skin problem, such as:  Poison oak and poison ivy.  Contact dermatitis (skin irritation from a plant, chemicals, fiberglass, detergents, new cosmetic, new jewelry, or other substance).  Fungus (such as athlete's foot, jock itch, or ringworm).  Head lice  Dandruff  Insect bite  Infection (such as Shingles or other virus infections). If the itching is all over (widespread), the possible causes are many.  These include:   Dry skin or eczema  Heat rash  Hives  Liver disorders  Kidney disorders TREATMENT  Localized itching   Lubrication of the skin. Use an ointment or cream or other unperfumed moisturizers if the skin is dry. Apply frequently, especially after bathing.  Anti-itch medicines. These medications may help control the urge to scratch. Scratching always makes itching worse and increases the chance of getting an infection.  Cortisone creams and ointments. These help  reduce the inflammation.  Antibiotics. Skin infections can cause itching. Topical or oral antibiotics may be needed for 10 to 20 days to get rid of an infection. If you can identify what caused the itching, avoid this substance in the future.  Widespread itching  The following measures may help to relieve itching regardless of the cause:   Wash the skin once with soap to remove irritants.  Bathe in tepid water with baking soda, cornstarch, or oatmeal.  Use calamine lotion (nonprescription) or a baking soda solution (1 teaspoon in 4 ounces of water on the skin).  Apply 1% hydrocortisone cream (no prescription needed). Do not use this if there might be a skin infection.  Avoid scratching.  Avoid itchy or tight-fitting clothes.  Avoid excessive heat, sweating, scented soaps, and swimming pools.  The lubricants, anti-itch medicines, etc. noted above may be helpful for controlling symptoms. SEEK MEDICAL CARE IF:   The itching becomes severe.  Your itch is not better after 1 week of treatment. Contact your caregiver to schedule further evaluation. Document Released: 11/11/2005 Document Revised: 02/03/2012 Document Reviewed: 05/01/2007 [facility] Patient Information 2013 [place], [place].

## 2012-12-22 ENCOUNTER — Telehealth: Payer: Self-pay | Admitting: Internal Medicine

## 2012-12-22 NOTE — Telephone Encounter (Signed)
Patient Information:  Caller Name: Dana [name]  Phone: [phone]  Patient: Dana [name], Dana [name]  Gender: Female  DOB: 04-29-1956  Age: 57 Years  PCP: Dana [name] (Family Practice)  Office Follow Up:  Does the office need to follow up with this patient?: Yes  Instructions For The Office: Patient desires to stop taking the prednisone; please advise how to finish prednisone course krs/can  RN Note:  Patient has already spoken to poison control; advised to monitor per their instructions.  States she misunderstood the directions and took prednisone 20mg , 3 tabs TID 12/21/12 rather than 3 tabs daily for 3 days.  Patient states she discovered that a new non-chlorine bleach on the sheets caused the rash.  States she wants to stop the prednisone.  Has taken no more prednisone.  Info to office for provider review/change in Rx/callback.  May reach patient at [phone] cell, or work [phone].  krs/can  Symptoms  Reason For Call & Symptoms: States took 9 prednisone tabs 12/21/12 instead of 3.  States has taken dosepak in the past, but the bottle states take 333, 222, 111, 1/2, 1/2, and 1/2.  States called after-hours RN and was transferred to poison control.  States poison control told her what to watch for, and also to watch for heart rate problems.  Reviewed Health History In EMR: Yes  Reviewed Medications In EMR: Yes  Reviewed Allergies In EMR: Yes  Reviewed Surgeries / Procedures: Yes  Date of Onset of Symptoms: 12/21/2012  Guideline(s) Used:  Poisoning  Disposition Per Guideline:   Call Poison Center Now  Reason For Disposition Reached:   All OTHER HARMFUL SUBSTANCES (e.g., nearly all drugs, plants, and chemicals) (EXCEPTION: harmless substances or harmless overdose such as double dose of OTC drug or antibiotic)  Advice Given:  N/A  RN Overrode Recommendation:  Document Patient  Patient has spoken with poison control center krs/can

## 2012-12-22 NOTE — Telephone Encounter (Signed)
Per WP, pt may stop the prednisone so long as she is no longer symptomatic.  Pt notified.  Denied sx.

## 2012-12-22 NOTE — Telephone Encounter (Signed)
Left message at home/work # for the pt to return my call.

## 2012-12-23 ENCOUNTER — Encounter: Payer: Self-pay | Admitting: Internal Medicine

## 2012-12-23 DIAGNOSIS — L299 Pruritus, unspecified: Secondary | ICD-10-CM | POA: Insufficient documentation

## 2013-01-13 ENCOUNTER — Other Ambulatory Visit: Payer: Self-pay | Admitting: Family Medicine

## 2013-01-13 ENCOUNTER — Telehealth: Payer: Self-pay | Admitting: Internal Medicine

## 2013-01-13 ENCOUNTER — Other Ambulatory Visit (INDEPENDENT_AMBULATORY_CARE_PROVIDER_SITE_OTHER): Payer: Managed Care, Other (non HMO)

## 2013-01-13 DIAGNOSIS — E039 Hypothyroidism, unspecified: Secondary | ICD-10-CM

## 2013-01-13 NOTE — Telephone Encounter (Signed)
Order placed in the system. 

## 2013-01-13 NOTE — Telephone Encounter (Signed)
Pt states Dr Fabian Sharp wanted her thyroid checked in [place]. (2 months). But I do not see an order. Pls advise. Pt was going to come later today.

## 2013-01-14 LAB — HM MAMMOGRAPHY: HM Mammogram: NORMAL

## 2013-01-18 ENCOUNTER — Encounter: Payer: Self-pay | Admitting: Internal Medicine

## 2013-01-20 ENCOUNTER — Other Ambulatory Visit: Payer: Self-pay | Admitting: Internal Medicine

## 2013-02-21 ENCOUNTER — Other Ambulatory Visit: Payer: Self-pay | Admitting: Internal Medicine

## 2013-04-02 ENCOUNTER — Telehealth: Payer: Self-pay | Admitting: Internal Medicine

## 2013-04-02 NOTE — Telephone Encounter (Signed)
Patient Information:  Caller Name: Dyamond  Phone: [phone]  Patient: Dana [name], Dana [name]  Gender: Female  DOB: 05/18/1956  Age: 57 Years  PCP: Berniece Andreas [facility])  Office Follow Up:  Does the office need to follow up with this patient?: Yes  Instructions For The Office: Patient will call office back to schedule appt.  RN Note:  Home care instructions and call back parameters reviewed with patient. She is currently driving and does not have calender and will call back to schedule appt. Understanding expressed.  Symptoms  Reason For Call & Symptoms: Patient describes "sorness point tenderness". Lower Right Quadrant of her abdomen. Onset 3-4  weeks.  She does not feel pain but soreness with touch. Wore snug jeans today and can feel it. Non radiating. No n/v/d. no fever.  Reviewed Health History In EMR: Yes  Reviewed Medications In EMR: Yes  Reviewed Allergies In EMR: Yes  Reviewed Surgeries / Procedures: Yes  Date of Onset of Symptoms: 03/12/2013  Guideline(s) Used:  Abdominal Pain - Female  Disposition Per Guideline:   See Today in Office  Reason For Disposition Reached:   Patient wants to be seen  Advice Given:  Rest:  Lie down and rest until you feel better.  Fluids:  Sip clear fluids only (e.g., water, flat soft drinks or 1/2 strength fruit juice) until the pain has been gone for over 2 hours. Then slowly return to a regular diet.  Diet:  Slowly advance diet from clear liquids to a bland diet  Avoid alcohol or caffeinated beverages  Avoid greasy or fatty foods.  Pass a BM:  Sit on the toilet and try to pass a bowel movement (BM). Do not strain. This may relieve the pain if it is due to constipation or impending diarrhea.  Avoid NSAIDS and Aspirin  : Avoid any drug that can irritate the stomach lining and make the pain worse (especially aspirin and NSAIDs like ibuprofen).  Call Back If:  Abdominal pain is constant and present for more than 2 hours  Abdominal  pains come and go and are present for more than 24 hours  You become worse.  RN Overrode Recommendation:  Follow Up With Office Later  Patient will call office back to schedule appt.

## 2013-04-05 ENCOUNTER — Encounter: Payer: Self-pay | Admitting: Internal Medicine

## 2013-04-05 ENCOUNTER — Ambulatory Visit (INDEPENDENT_AMBULATORY_CARE_PROVIDER_SITE_OTHER): Payer: Managed Care, Other (non HMO) | Admitting: Internal Medicine

## 2013-04-05 VITALS — BP 148/90 | HR 71 | Temp 98.2°F | Wt 129.0 lb

## 2013-04-05 DIAGNOSIS — R10813 Right lower quadrant abdominal tenderness: Secondary | ICD-10-CM

## 2013-04-05 DIAGNOSIS — R109 Unspecified abdominal pain: Secondary | ICD-10-CM

## 2013-04-05 LAB — POCT URINALYSIS DIPSTICK
Bilirubin, UA: NEGATIVE
Glucose, UA: NEGATIVE
Leukocytes, UA: NEGATIVE
Nitrite, UA: NEGATIVE

## 2013-04-05 LAB — CBC WITH DIFFERENTIAL/PLATELET
Basophils Relative: 0.6 % (ref 0.0–3.0)
Eosinophils Relative: 7.2 % — ABNORMAL HIGH (ref 0.0–5.0)
HCT: 40.4 % (ref 36.0–46.0)
Lymphs Abs: 1.2 10*3/uL (ref 0.7–4.0)
MCV: 89.7 fl (ref 78.0–100.0)
Monocytes Absolute: 0.9 10*3/uL (ref 0.1–1.0)
Monocytes Relative: 12.2 % — ABNORMAL HIGH (ref 3.0–12.0)
Neutrophils Relative %: 63.3 % (ref 43.0–77.0)
RBC: 4.5 Mil/uL (ref 3.87–5.11)
WBC: 7.1 10*3/uL (ref 4.5–10.5)

## 2013-04-05 LAB — BASIC METABOLIC PANEL
Chloride: 103 mEq/L (ref 96–112)
GFR: 77.03 mL/min (ref >60.00)
Potassium: 4.1 mEq/L (ref 3.5–5.1)

## 2013-04-05 NOTE — Progress Notes (Signed)
Chief Complaint  Patient presents with  . Abdominal Tenderness    HPI: Patient comes in today for SDA for  new problem evaluation. Patient was in her usual state of health till about 2 and half weeks ago when she noticed soreness in her right lower cartridge area of her abdomen when she was leaning over a counter washing dishes. Since that time discontinued and possibly progressed is very tender to touch when she is standing but not when she is lying down. No associated symptoms nausea vomiting change in bowel habits UTI symptoms or hematuria.  She's worried about her ovaries because of friend had ovarian cancer. She denies any specific injury bruising and no lower abdominal surgeries.   ROS: See pertinent positives and negatives per HPI. No syncope vomiting fever or chills symptoms such as above in the past other systemic symptoms. No change in bowel habits  Past Medical History  Diagnosis Date  . High blood pressure     readings  . High cholesterol   . Migraine with aura     auras  . Thyroid disease   . Stroke     on hrt double vision- possible blood clot on brain; Dr. Lesia Sago at [facility]   . Pneumonia 2007    and flu hospitalized   . Hx of varicella   . Other and unspecified hyperlipidemia 04/27/2012    Has been on meds for a number of years .  Low dose crestor .  Baseline reported as 280/     Family History  Problem Relation Age of Onset  . Adopted: Yes  . Cancer Maternal Aunt     breast    History   Social History  . Marital Status: Married    Spouse Name: N/A    Number of Children: N/A  . Years of Education: N/A   Social History Main Topics  . Smoking status: Never Smoker   . Smokeless tobacco: Never Used  . Alcohol Use: No  . Drug Use: No  . Sexually Active: Yes   Other Topics Concern  . 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    Neg tad   Some caffiene    Seatbelts no ets fa stored    G1P1   Physically active     5 yo grandaughter                    Outpatient Encounter Prescriptions as of 04/05/2013  Medication Sig Dispense Refill  . aspirin 81 MG tablet Take 81 mg by mouth daily.      Marland Kitchen atorvastatin (LIPITOR) 10 MG tablet Take 5 mg by mouth daily.      . hydrochlorothiazide (HYDRODIURIL) 25 MG tablet TAKE 1 TABLET BY MOUTH EVERY DAY  90 tablet  2  . KLOR-CON M10 10 MEQ tablet TAKE 1 TABLET TWICE A DAY  60 tablet  8  . levothyroxine (SYNTHROID, LEVOTHROID) 75 MCG tablet Take 1 tablet (75 mcg total) by mouth daily.  30 tablet  5  . [DISCONTINUED] permethrin (ELIMITE) 5 % cream Apply topically once. Wash after 8-12 hours  60 g  1  . [DISCONTINUED] predniSONE (DELTASONE) 20 MG tablet Take 3,3,3,2,2,2,1,1,1, 1/.2 1./2 1/.2 pills  qd  24 tablet  0   No facility-administered encounter medications on file as of 04/05/2013.    EXAM:  BP 148/90  Pulse 71  Temp(Src) 98.2 F (36.8 C) (Oral)  Wt 129 lb (58.514 kg)  BMI 21.62 kg/m2  SpO2 98%  LMP 04/25/2009  Body mass index is 21.62 kg/(m^2).  GENERAL: vitals reviewed and listed above, alert, oriented, appears well hydrated and in no acute distress HEENT: atraumatic, conjunctiva  clear, no obvious abnormalities on inspection of external nose and ears NECK: no obvious masses on inspection palpation  LUNGS: clear to auscultation bilaterally, no wheezes, rales or rhonchi, good air movement CV: HRRR, no clubbing cyanosis or  peripheral edema nl cap refill  Abdomen standing no obvious defect point tenderness about a quarter size possibly a soft nodule that is tender. Upon laying there is no organomegaly  Psoas sign or tenderness elicited no flank pain no hepatosplenomegaly. Area of concern is high right lower quadrant just lateral of midline. MS: moves all extremities without noticeable focal   abnormality gait seems to be normal  PSYCH: pleasant and cooperative, no obvious depression or anxiety  ASSESSMENT AND PLAN:  Discussed the following assessment and plan:  Right lower quadrant abdominal tenderness - Uncertain cause possible small abdominal wall hernia patient concerned about ovarian cause check abdominal pelvic CT to begin risk-benefit discussed - Plan: Basic metabolic panel, CBC with Differential, Sedimentation rate, CT Abdomen Pelvis W Contrast  Abdominal pain, unspecified site - Plan: POC Urinalysis Dipstick  Right lower quadrant abdominal tenderness - Plan: Basic metabolic panel, CBC with Differential, Sedimentation rate, CT Abdomen Pelvis W Contrast Options discussed about evaluation and we'll get labs today and proceed with abdominal CT pelvic  then followup depending on results. BMP and CBC today.  If no obvious cause we can have her see her gynecologist for evaluation although the area seems a bit higher than ovarian -Patient advised to return or notify health care team  if symptoms worsen or persist or new concerns arise.  Patient Instructions  This could be and  a bdominal wall problem .  We'll arrange abdominal pelvic CT scan to get an image of the abdominal wall and pelvic abdominal organs.  Blood work today will notify you of all of these results and then plan followup.   Neta Mends. Panosh M.D.

## 2013-04-05 NOTE — Patient Instructions (Signed)
This could be and  a bdominal wall problem .  We'll arrange abdominal pelvic CT scan to get an image of the abdominal wall and pelvic abdominal organs.  Blood work today will notify you of all of these results and then plan followup.

## 2013-04-06 ENCOUNTER — Ambulatory Visit (INDEPENDENT_AMBULATORY_CARE_PROVIDER_SITE_OTHER)
Admission: RE | Admit: 2013-04-06 | Discharge: 2013-04-06 | Disposition: A | Discharge status: Home / Self Care | Payer: Managed Care, Other (non HMO) | Source: Ambulatory Visit | Attending: Internal Medicine | Admitting: Internal Medicine

## 2013-04-06 DIAGNOSIS — R10813 Right lower quadrant abdominal tenderness: Secondary | ICD-10-CM

## 2013-04-06 MED ORDER — IOHEXOL 300 MG/ML  SOLN
100.0000 mL | Freq: Once | INTRAMUSCULAR | Status: AC | PRN
  Administered 2013-04-06: 100 mL via INTRAVENOUS

## 2013-04-07 ENCOUNTER — Telehealth: Payer: Self-pay | Admitting: Internal Medicine

## 2013-04-07 NOTE — Telephone Encounter (Signed)
Results of lab work and CT scan given to the pt.  She reports.

## 2013-04-07 NOTE — Telephone Encounter (Signed)
Patient called stating that she would like a call back with CT scan results. Please assist.

## 2013-04-24 ENCOUNTER — Other Ambulatory Visit: Payer: Self-pay | Admitting: Internal Medicine

## 2013-06-19 ENCOUNTER — Other Ambulatory Visit: Payer: Self-pay | Admitting: Internal Medicine

## 2013-06-21 ENCOUNTER — Ambulatory Visit (INDEPENDENT_AMBULATORY_CARE_PROVIDER_SITE_OTHER): Payer: Managed Care, Other (non HMO) | Admitting: Internal Medicine

## 2013-06-21 ENCOUNTER — Encounter: Payer: Self-pay | Admitting: Internal Medicine

## 2013-06-21 VITALS — BP 142/92 | HR 73 | Temp 98.4°F | Wt 126.0 lb

## 2013-06-21 DIAGNOSIS — R10813 Right lower quadrant abdominal tenderness: Secondary | ICD-10-CM

## 2013-06-21 NOTE — Patient Instructions (Signed)
Will arrange  A surgical consult to feel this area ? If could be a small hernia or  Related to the abdominal wall area .   Contact us in interim if needed

## 2013-06-21 NOTE — Progress Notes (Signed)
Chief Complaint  Patient presents with  . Abdominal Pain    Only has pain when it is touched.    HPI: Patient comes in today because she is having continued tenderness to touch to one small area of her right abdomen. We had done a CT scan which showed no acute finding. Gyne date exam and was told completely normal and not causing the tenderness.  No other change in health status. She describes it as a soreness to touch when she stands may be feeling of fullness when she lays down it is hard to find no progression no new symptoms no improvement.  She did find out that her biologic mother has recently had a blood cancer and asks about this.  ROS: See pertinent positives and negatives per HPI.  Past Medical History  Diagnosis Date  . High blood pressure     readings  . High cholesterol   . Migraine with aura     auras  . Thyroid disease   . Stroke     on hrt double vision- possible blood clot on brain; Dr. Lesia Sago at [facility]   . Pneumonia 2007    and flu hospitalized   . Hx of varicella   . Other and unspecified hyperlipidemia 04/27/2012    Has been on meds for a number of years .  Low dose crestor .  Baseline reported as 280/   . Non-cardiac chest pain     hops and evaluation 2011 dr Riley Kill    Family History  Problem Relation Age of Onset  . Adopted: Yes  . Cancer Maternal Aunt     breast  . Other Mother     blood cancer disease in biological mom    History   Social History  . Marital Status: Married    Spouse Name: N/A    Number of Children: N/A  . Years of Education: N/A   Social History Main Topics  . Smoking status: Never Smoker   . Smokeless tobacco: Never Used  . Alcohol Use: No  . Drug Use: No  . Sexually Active: Yes   Other Topics Concern  . 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    Neg tad   Some caffiene    Seatbelts no ets fa stored    G1P1   Physically active     5 yo grandaughter                    Outpatient Encounter Prescriptions as of 06/21/2013  Medication Sig Dispense Refill  . aspirin 81 MG tablet Take 81 mg by mouth daily.      Marland Kitchen atorvastatin (LIPITOR) 10 MG tablet Take 5 mg by mouth daily.      . hydrochlorothiazide (HYDRODIURIL) 25 MG tablet TAKE 1 TABLET BY MOUTH EVERY DAY  90 tablet  2  . KLOR-CON M10 10 MEQ tablet TAKE 1 TABLET TWICE A DAY  60 tablet  8  . levothyroxine (SYNTHROID, LEVOTHROID) 75 MCG tablet TAKE 1 TABLET (75 MCG TOTAL) BY MOUTH DAILY.  30 tablet  5   No facility-administered encounter medications on file as of 06/21/2013.    EXAM:  BP 142/92  Pulse 73  Temp(Src) 98.4 F (36.9 C) (Oral)  Wt 126 lb (57.153 kg)  BMI 21.12 kg/m2  SpO2 97%  LMP 04/25/2009  Body mass index is 21.12 kg/(m^2).  GENERAL: vitals reviewed and listed above, alert, oriented, appears well hydrated and in no acute distress HEENT: atraumatic, conjunctiva  clear, no obvious abnormalities on inspection of external nose and ears  CV: HRRR, no clubbing cyanosis or  peripheral edema nl cap refill  Abdomen soft without organomegaly obvious well-healed on the scar. On laying down there is no tenderness and no mass effect when standing there is point tenderness inferolateral to the right umbilicus about a centimeter nodular feeling subcutaneous versus abdominal wall prominence. This is the area of tenderness. There is no redness or visual mass. MS: moves all extremities without noticeable focal  abnormality PSYCH: pleasant and cooperative,   ASSESSMENT AND PLAN:  Discussed the following assessment and plan:  Right lower quadrant abdominal tenderness - Plan: Ambulatory referral to General Surgery I am still suspicious of an abdominal wall process perhaps a very small hernia. Because it is tender when she stands and goes away  when she lays down. Told her it is reassuring to have a normal abdominal pelvic CT and gynecologic evaluation. Plan surgical consultation opinion of her exam and findings to give [place] an opinion on this area. Review your last lab tests which were normal except for an elevated sedimentation rate.  Plan regular preventive visit was. Of labs in December. We can change this if needed  -Patient advised to return or notify health care team  if symptoms worsen or persist or new concerns arise.  Patient Instructions  Will arrange  A surgical consult to feel this area ? If could be a small hernia or  Related to the abdominal wall area .   Contact us in interim if needed   Neta Mends. Panosh M.D.

## 2013-06-28 ENCOUNTER — Other Ambulatory Visit: Payer: Self-pay | Admitting: Internal Medicine

## 2013-07-02 ENCOUNTER — Encounter (INDEPENDENT_AMBULATORY_CARE_PROVIDER_SITE_OTHER): Payer: Self-pay | Admitting: General Surgery

## 2013-07-02 ENCOUNTER — Ambulatory Visit (INDEPENDENT_AMBULATORY_CARE_PROVIDER_SITE_OTHER): Payer: Managed Care, Other (non HMO) | Admitting: General Surgery

## 2013-07-02 VITALS — BP 132/68 | HR 72 | Temp 98.2°F | Resp 16 | Ht 66.0 in | Wt 127.8 lb

## 2013-07-02 DIAGNOSIS — R109 Unspecified abdominal pain: Secondary | ICD-10-CM

## 2013-07-02 NOTE — Progress Notes (Signed)
Subjective:     Patient ID: Dana [name], female   DOB: November 22, 1956, 57 y.o.   MRN: [id number]  HPI The patient is a 57 year old female who was referred by Dr. Fabian Sharp for evaluation of abdominal wall pain. Patient underwent CT scan which revealed no acute finding. The patient isn't this began approximately April of this year and is mainly complaining of soreness no real pain. She states with exercises she has no pain.  Review of Systems  Constitutional: Negative.   HENT: Negative.   Respiratory: Negative.   Cardiovascular: Negative.   Gastrointestinal: Positive for abdominal pain.  Neurological: Negative.        Objective:   Physical Exam  Constitutional: She is oriented to person, place, and time. She appears well-developed and well-nourished.  HENT:  Head: Normocephalic and atraumatic.  Eyes: Conjunctivae and EOM are normal. Pupils are equal, round, and reactive to light.  Neck: Normal range of motion. Neck supple.  Cardiovascular: Normal rate, regular rhythm and normal heart sounds.   Pulmonary/Chest: Effort normal and breath sounds normal.  Abdominal: Soft. Bowel sounds are normal. She exhibits no distension and no mass. There is tenderness (inf to umb). There is no rebound and no guarding. Hernia confirmed negative in the ventral area.  Musculoskeletal: Normal range of motion.  Neurological: She is alert and oriented to person, place, and time.  Skin: Skin is warm and dry.       Assessment:     57 year old female with abdominal wall soreness, no hernia on palpation or on CT scan     Plan:     1. Patient followup with Dr. Darylene Price and follow up with me when necessary

## 2013-08-19 ENCOUNTER — Telehealth: Payer: Self-pay | Admitting: Internal Medicine

## 2013-08-19 NOTE — Telephone Encounter (Signed)
Pt is still having stomach issues and abdominal pain (gas) bloating in lower stomach. Sore place on stomach no better, Pt would like to come in Monday is possible. pls advise.

## 2013-08-19 NOTE — Telephone Encounter (Signed)
Patient scheduled at 9:45 on 08/23/13

## 2013-08-23 ENCOUNTER — Encounter: Payer: Self-pay | Admitting: Internal Medicine

## 2013-08-23 ENCOUNTER — Ambulatory Visit (INDEPENDENT_AMBULATORY_CARE_PROVIDER_SITE_OTHER): Payer: Managed Care, Other (non HMO) | Admitting: Internal Medicine

## 2013-08-23 VITALS — BP 120/86 | HR 71 | Temp 97.8°F | Wt 130.0 lb

## 2013-08-23 DIAGNOSIS — R141 Gas pain: Secondary | ICD-10-CM

## 2013-08-23 DIAGNOSIS — Z23 Encounter for immunization: Secondary | ICD-10-CM

## 2013-08-23 DIAGNOSIS — R10813 Right lower quadrant abdominal tenderness: Secondary | ICD-10-CM

## 2013-08-23 DIAGNOSIS — R140 Abdominal distension (gaseous): Secondary | ICD-10-CM

## 2013-08-23 DIAGNOSIS — R700 Elevated erythrocyte sedimentation rate: Secondary | ICD-10-CM | POA: Insufficient documentation

## 2013-08-23 NOTE — Progress Notes (Signed)
Chief Complaint  Patient presents with  . Follow-up    Abdominal pain continues    HPI: Comes in for fu of abd pain tenderness RLQ  :surgeon doesn't thin=k henria or surgical problem pain or discomfort is worse when she stands up and leans over like when washing dishes. She still concerned about this Sore spot in stomach .  Last week had a severe  episode of bad gas and bloating and uncomfortable hard to pass gas like trapped .  Got better   And encouraged to seek care. She has had these episodes off and on for a while but this was worse. Almost didn't come today but instead came with her daughter because her family encouraged her. Since last visit  No vomiting   No weight loss. Fever her stay she does any very healthy no milk some lactose no blood in her stool stool size appears normal daughter states she doesn't drink a lot of water or fluids.  GYNE had an exam and was told everything was okay no history of abdominal surgery GI  no current GI may be due for colonoscopy soon. ROS: See pertinent positives and negatives per HPI.  Past Medical History  Diagnosis Date  . High blood pressure     readings  . High cholesterol   . Migraine with aura     auras  . Thyroid disease   . Stroke     on hrt double vision- possible blood clot on brain; Dr. Lesia Sago at [facility]   . Pneumonia 2007    and flu hospitalized   . Hx of varicella   . Other and unspecified hyperlipidemia 04/27/2012    Has been on meds for a number of years .  Low dose crestor .  Baseline reported as 280/   . Non-cardiac chest pain     hops and evaluation 2011 dr Riley Kill    Family History  Problem Relation Age of Onset  . Adopted: Yes  . Cancer Maternal Aunt     breast  . Other Mother     blood cancer disease in biological mom    History   Social History  . Marital Status: Married    Spouse Name: N/A    Number of Children: N/A  . Years of Education: N/A   Social History Main Topics  .  Smoking status: Never Smoker   . Smokeless tobacco: Never Used  . Alcohol Use: No  . Drug Use: No  . Sexual Activity: Yes   Other Topics Concern  . 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    Neg tad   Some caffiene    Seatbelts no ets fa stored    G1P1   Physically active     5 yo grandaughter                    Outpatient Encounter Prescriptions as of 08/23/2013  Medication Sig Dispense Refill  . aspirin 81 MG tablet Take 81 mg by mouth daily.      Marland Kitchen atorvastatin (LIPITOR) 10 MG tablet Take 5 mg by mouth daily.      . hydrochlorothiazide (HYDRODIURIL) 25 MG tablet TAKE 1 TABLET BY MOUTH EVERY DAY  90 tablet  2  . levothyroxine (SYNTHROID, LEVOTHROID) 75 MCG tablet TAKE 1 TABLET (75 MCG TOTAL) BY MOUTH DAILY.  30 tablet  5  . potassium chloride (KLOR-CON M10) 10 MEQ tablet       . [DISCONTINUED] KLOR-CON M10 10 MEQ tablet TAKE 1 TABLET TWICE A DAY  60 tablet  8   No facility-administered encounter medications on file as of 08/23/2013.    EXAM:  BP 120/86  Pulse 71  Temp(Src) 97.8 F (36.6 C) (Oral)  Wt 130 lb (58.968 kg)  BMI 20.99 kg/m2  SpO2 98%  LMP 04/25/2009  Body mass index is 20.99 kg/(m^2).  GENERAL: vitals reviewed and listed above, alert, oriented, appears well hydrated and in no acute distress  here with daughter normal affect and conversation Repeat blood pressure diastolic borderline high  NECK: no obvious masses on inspection palpation   Abdomen soft without megaly guarding or rebound denies pain when she lays down however on deep palpation right of midline low were there is a tender spot when she stands it is more tender. Gait is within normal limits  CV: HRRR, no clubbing cyanosis or  peripheral edema nl cap refill  MS: moves all extremities without noticeable focal   abnormality PSYCH: pleasant and cooperative, no obvious depression   Lab Results  Component Value Date   WBC 7.1 04/05/2013   HGB 13.6 04/05/2013   HCT 40.4 04/05/2013   PLT 308.0 04/05/2013   GLUCOSE 74 04/05/2013   CHOL 161 10/20/2012   TRIG 74.0 10/20/2012   HDL 72.80 10/20/2012   LDLCALC 73 10/20/2012   ALT 30 10/20/2012   AST 26 10/20/2012   NA 138 04/05/2013   K 4.1 04/05/2013   CL 103 04/05/2013   CREATININE 0.8 04/05/2013   BUN 12 04/05/2013   CO2 26 04/05/2013   TSH 1.30 01/13/2013   INR 0.97 03/01/2010    ASSESSMENT AND PLAN:  Discussed the following assessment and plan:  Right lower quadrant abdominal tenderness - Try fodmax diet and refer for Gi evaluation of sx etc - Plan: Ambulatory referral to Gastroenterology  Need for prophylactic vaccination and inoculation against influenza - Plan: Flu Vaccine QUAD 36+ mos PF IM (Fluarix)  Abdominal bloating - Worsening of her baseline has some chronic constipation - Plan: Ambulatory referral to Gastroenterology  ESR raised - unclear sig. - Plan: Ambulatory referral to Gastroenterology Labs done in end of May showed normal BMP and CBC but his sed rate in the 70s uncertain significance of this finding not repeated today. Refer to GI for consultation of symptom complex further evaluation. May need a colonoscopy done early. -Patient advised to return or notify health care team  if symptoms worsen or persist or new concerns arise.  Patient Instructions  You will be contacted about  Gi consult referral Int he meantime  Try the FODMAP diet  which is helpful for some people with irritable bowel and gas and bloating .  [employer] is one site about this    Neta Mends. Panosh M.D.

## 2013-08-23 NOTE — Patient Instructions (Signed)
You will be contacted about  Gi consult referral Int he meantime  Try the FODMAP diet  which is helpful for some people with irritable bowel and gas and bloating .  [employer] is one site about this

## 2013-08-31 ENCOUNTER — Ambulatory Visit: Payer: Managed Care, Other (non HMO) | Admitting: Internal Medicine

## 2013-09-06 ENCOUNTER — Encounter: Payer: Self-pay | Admitting: Internal Medicine

## 2013-09-16 ENCOUNTER — Ambulatory Visit (INDEPENDENT_AMBULATORY_CARE_PROVIDER_SITE_OTHER): Payer: Managed Care, Other (non HMO) | Admitting: Internal Medicine

## 2013-09-16 ENCOUNTER — Encounter: Payer: Self-pay | Admitting: Internal Medicine

## 2013-09-16 VITALS — BP 112/74 | HR 64 | Ht 64.5 in | Wt 128.8 lb

## 2013-09-16 DIAGNOSIS — R10813 Right lower quadrant abdominal tenderness: Secondary | ICD-10-CM

## 2013-09-16 DIAGNOSIS — K5900 Constipation, unspecified: Secondary | ICD-10-CM

## 2013-09-16 DIAGNOSIS — Z1211 Encounter for screening for malignant neoplasm of colon: Secondary | ICD-10-CM

## 2013-09-16 DIAGNOSIS — K5909 Other constipation: Secondary | ICD-10-CM

## 2013-09-16 MED ORDER — NA SULFATE-K SULFATE-MG SULF 17.5-3.13-1.6 GM/177ML PO SOLN: ORAL | Status: DC

## 2013-09-16 NOTE — Assessment & Plan Note (Signed)
I've advised daily milk of magnesia as that seems to help. Will followup with her after her screening colonoscopy.

## 2013-09-16 NOTE — Assessment & Plan Note (Signed)
Cause not clear - cannot discern by hx or exam CT and GYN eval negative Will review CT with radiology I think repeating CT or other imaging 6+ months out to be sure nothing missed is sensible Screening colonoscopy first The risks and benefits as well as alternatives of endoscopic procedure(s) have been discussed and reviewed. All questions answered. The patient agrees to proceed.

## 2013-09-16 NOTE — Patient Instructions (Signed)
You have been scheduled for a colonoscopy with propofol. Please follow written instructions given to you at your visit today.  Please pick up your prep kit at the pharmacy within the next 1-3 days. If you use inhalers (even only as needed), please bring them with you on the day of your procedure. Your physician has requested that you go to www.startemmi.com and enter the access code given to you at your visit today. This web site gives a general overview about your procedure. However, you should still follow specific instructions given to you by our office regarding your preparation for the procedure.  It is the time of year to have a vaccination to prevent the flu (influenza virus).  Please have this done through your primary care provider or you can get this done at local pharmacies or the [facility]. It would be very helpful if you notify your primary care provider when and where you had the vaccination given by messaging them in My Chart, leaving a message or faxing the information.  I appreciate the opportunity to care for you.  

## 2013-09-16 NOTE — Progress Notes (Signed)
Referred by: Madelin Headings, MD  Subjective:    Patient ID: Dana [name], female    DOB: [birth year], 57 y.o.   MRN: [id number]  HPI Is a very pleasant married middle-aged white woman here with her daughter, because of a 5-6 month history of tenderness in the right lower quadrant area of the abdomen. She says she was doing the dishes in leaned over-the-counter notices tender area. It does not seem to bother her but she notices if she puts pressure on the abdomen. There is been no other associated change though she does have constipation problems. She moves her bowels once or twice a week but has always been that way. Phillips milk of magnesia will help some. She does not take anything regularly over fear of being" hooked" on laxatives. She has been evaluated by CT scanning, general surgery and gynecology and no cause for this pain has been identified. Her GI review of systems is otherwise negative. She has not tried any therapy for this as that only bothers her she puts pressure on it. She has been reassured but is still concerned about what could be causing this pain. Allergies  Allergen Reactions  . Estrogens Other (See Comments)    Had stroke on HRT/OCP  . Codeine     REACTION: nausea  . Ibuprofen     REACTION: nause   Outpatient Prescriptions Prior to Visit  Medication Sig Dispense Refill  . aspirin 81 MG tablet Take 81 mg by mouth daily.      Marland Kitchen atorvastatin (LIPITOR) 10 MG tablet Take 5 mg by mouth daily.      . hydrochlorothiazide (HYDRODIURIL) 25 MG tablet TAKE 1 TABLET BY MOUTH EVERY DAY  90 tablet  2  . levothyroxine (SYNTHROID, LEVOTHROID) 75 MCG tablet TAKE 1 TABLET (75 MCG TOTAL) BY MOUTH DAILY.  30 tablet  5  . potassium chloride (KLOR-CON M10) 10 MEQ tablet        No facility-administered medications prior to visit.   Past Medical History  Diagnosis Date  . High blood pressure     readings  . High cholesterol   . Migraine with aura     auras  . Thyroid disease   .  Stroke     on hrt double vision- possible blood clot on brain; Dr. Lesia Sago at [facility]   . Pneumonia 2007    and flu hospitalized   . Hx of varicella   . Other and unspecified hyperlipidemia 04/27/2012    Has been on meds for a number of years .  Low dose crestor .  Baseline reported as 280/   . Non-cardiac chest pain     hops and evaluation 2011 dr Riley Kill  . CVA (cerebral infarction) 2010   Past Surgical History  Procedure Laterality Date  . Tonsillectomy    . Foot surgery      had bunyon removed  . Breast surgery      implants  . Colonoscopy     History   Social History  . Marital Status: Married    Spouse Name: N/A    Number of Children: N/A  . Years of Education: N/A   Social History Main Topics  . Smoking status: Never Smoker   . Smokeless tobacco: Never Used  . Alcohol Use: No  . Drug Use: No  . Sexual Activity: Yes   Other Topics Concern  . 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    Neg tad   Some caffiene    Seatbelts no ets fa stored    G1P1   Physically active     5 yo grandaughter                   Family History  Problem Relation Age of Onset  . Adopted: Yes  . Cancer Maternal Aunt     breast  . Other Mother     blood cancer disease in biological mom   Review of Systems Positive for night sweats. All other review of systems are negative or as per history of present illness.    Objective:   Physical Exam General:  Well-developed, well-nourished and in no acute distress Eyes:  anicteric. Lungs: Clear to auscultation bilaterally. Heart:  S1S2, no rubs, murmurs, gallops. Abdomen:  soft,  Mild-mod tender a few cm to right and below umbilicus, disappears with muscle tension, no hepatosplenomegaly, hernia, or mass and BS+.  Rectal: deferred Muscskel No  CVAT or back/spine tenderness Extremities:   no edema Skin   no rash. Neuro:  A&O x 3.  Psych:  appropriate mood and  Affect.   Data Reviewed: As per history of present illness, I reviewed primary care and surgery notes and a CT scan with images.    Assessment & Plan:   1. Right lower quadrant abdominal tenderness - chronic   2. Special screening for malignant neoplasms, colon   3. Chronic constipation    Please see problem oriented charting as well.  I appreciate the opportunity to care for this patient. CC: Lorretta Harp, MD

## 2013-09-21 ENCOUNTER — Encounter: Payer: Self-pay | Admitting: Internal Medicine

## 2013-09-28 ENCOUNTER — Encounter: Payer: Self-pay | Admitting: Internal Medicine

## 2013-09-28 ENCOUNTER — Ambulatory Visit (AMBULATORY_SURGERY_CENTER): Payer: Managed Care, Other (non HMO) | Admitting: Internal Medicine

## 2013-09-28 VITALS — BP 121/81 | HR 67 | Temp 98.0°F | Resp 17 | Ht 64.5 in | Wt 128.0 lb

## 2013-09-28 DIAGNOSIS — K5730 Diverticulosis of large intestine without perforation or abscess without bleeding: Secondary | ICD-10-CM

## 2013-09-28 DIAGNOSIS — Z1211 Encounter for screening for malignant neoplasm of colon: Secondary | ICD-10-CM

## 2013-09-28 MED ORDER — DICLOFENAC SODIUM 1 % TD GEL: 2.0000 g | Freq: Four times a day (QID) | TRANSDERMAL | Status: DC

## 2013-09-28 MED ORDER — SODIUM CHLORIDE 0.9 % IV SOLN: 500.0000 mL | INTRAVENOUS | Status: DC

## 2013-09-28 NOTE — Progress Notes (Signed)
Patient did not experience any of the following events: a burn prior to discharge; a fall within the facility; wrong site/side/patient/procedure/implant event; or a hospital transfer or hospital admission upon discharge from the facility. (G8907)Please refer to the blue and neon green sheets for instructions regarding diet and activity for the rest of today.Patient did not have preoperative order for IV antibiotic SSI prophylaxis. [phone])

## 2013-09-28 NOTE — Op Note (Signed)
[facility] [address] [place], [address]   COLONOSCOPY PROCEDURE REPORT  PATIENT: Dana, [name]  MR#: [id number] BIRTHDATE: [birth year] , 57  yrs. old GENDER: Female ENDOSCOPIST: Iva Boop, MD, [facility] REFERRED AO:ZHYQM Lonie Peak, M.D. PROCEDURE DATE:  09/28/2013 PROCEDURE:   Colonoscopy, screening First Screening Colonoscopy - Avg.  risk and is 50 yrs.  old or older - No.  Prior Negative Screening - Now for repeat screening. 10 or more years since last screening  History of Adenoma - Now for follow-up colonoscopy & has been > or = to 3 yrs.  N/A  Polyps Removed Today? No.  Recommend repeat exam, <10 yrs? No. ASA CLASS:   Class II INDICATIONS:average risk screening and Last colonoscopy performed 10 years ago. MEDICATIONS: propofol (Diprivan) 350mg  IV, MAC sedation, administered by CRNA, and These medications were titrated to patient response per physician's verbal order  DESCRIPTION OF PROCEDURE:   After the risks benefits and alternatives of the procedure were thoroughly explained, informed consent was obtained.  A digital rectal exam revealed no abnormalities of the rectum.   The LB [id number]-H190 [id number]  endoscope was introduced through the anus and advanced to the terminal ileum which was intubated for a short distance. No adverse events experienced.   The quality of the prep was excellent using Suprep The instrument was then slowly withdrawn as the colon was fully examined.   COLON FINDINGS: Mild diverticulosis was noted in the sigmoid colon. The colon mucosa was otherwise normal.   A right colon retroflexion was performed.   The mucosa appeared normal in the terminal ileum.  Retroflexed views revealed no abnormalities. The time to cecum=2 minutes 50 seconds.  Withdrawal time=8 minutes 33 seconds.  The scope was withdrawn and the procedure completed. COMPLICATIONS: There were no complications.  ENDOSCOPIC IMPRESSION: 1.   Mild diverticulosis was  noted in the sigmoid colon 2.   The colon mucosa was otherwise normal - excellent prep - second screening 3.   Normal mucosa in the terminal ileum  RECOMMENDATIONS: 1.  Repeat routine colonoscopy 10 years - 2024 2.   Voltaren gel for RLQ tenderness  eSigned:  Iva Boop, MD, [facility] 09/28/2013 11:48 AM   cc: Madelin Headings, MD and The Patient

## 2013-09-28 NOTE — Patient Instructions (Addendum)
The colonoscopy revealed a condition called diverticulosis - common and not usually a problem. Please read the handout provided. Everything else is ok and I called the radiologist - CT scan from May is ok.  Next routine colonoscopy in 10 years - 2024.  I have prescribed a topical anti-inflammatory called Voltaren gel for you to use on the abdominal wall to see if that helps.  I appreciate the opportunity to care for you. Iva Boop, MD, FACG  YOU HAD AN ENDOSCOPIC PROCEDURE TODAY AT THE [facility]: Refer to the procedure report that was given to you for any specific questions about what was found during the examination.  If the procedure report does not answer your questions, please call your gastroenterologist to clarify.  If you requested that your care partner not be given the details of your procedure findings, then the procedure report has been included in a sealed envelope for you to review at your convenience later.  YOU SHOULD EXPECT: Some feelings of bloating in the abdomen. Passage of more gas than usual.  Walking can help get rid of the air that was put into your GI tract during the procedure and reduce the bloating. If you had a lower endoscopy (such as a colonoscopy or flexible sigmoidoscopy) you may notice spotting of blood in your stool or on the toilet paper. If you underwent a bowel prep for your procedure, then you may not have a normal bowel movement for a few days.  DIET: Your first meal following the procedure should be a light meal and then it is ok to progress to your normal diet.  A half-sandwich or bowl of soup is an example of a good first meal.  Heavy or fried foods are harder to digest and may make you feel nauseous or bloated.  Likewise meals heavy in dairy and vegetables can cause extra gas to form and this can also increase the bloating.  Drink plenty of fluids but you should avoid alcoholic beverages for 24 hours.  ACTIVITY: Your care partner should  take you home directly after the procedure.  You should plan to take it easy, moving slowly for the rest of the day.  You can resume normal activity the day after the procedure however you should NOT DRIVE or use heavy machinery for 24 hours (because of the sedation medicines used during the test).    SYMPTOMS TO REPORT IMMEDIATELY: A gastroenterologist can be reached at any hour.  During normal business hours, 8:30 AM to 5:00 PM Monday through Friday, call [phone].  After hours and on weekends, please call the GI answering service at [phone] who will take a message and have the physician on call contact you.   Following lower endoscopy (colonoscopy or flexible sigmoidoscopy):  Excessive amounts of blood in the stool  Significant tenderness or worsening of abdominal pains  Swelling of the abdomen that is new, acute  Fever of 100F or higher  FOLLOW UP: Our staff will call the home number listed on your records the next business day following your procedure to check on you and address any questions or concerns that you may have at that time regarding the information given to you following your procedure. This is a courtesy call and so if there is no answer at the home number and we have not heard from you through the emergency physician on call, we will assume that you have returned to your regular daily activities without incident.  SIGNATURES/CONFIDENTIALITY: You  and/or your care partner have signed paperwork which will be entered into your electronic medical record.  These signatures attest to the fact that that the information above on your After Visit Summary has been reviewed and is understood.  Full responsibility of the confidentiality of this discharge information lies with you and/or your care-partner.  Please continue your normal medications  Please use the Voltaren Gel for abdominal tenderness- this prescription was sent to your pharmacy  Follow up colonoscopy in 10  years  Please read over information about diverticulosis

## 2013-09-29 ENCOUNTER — Telehealth: Payer: Self-pay

## 2013-09-29 NOTE — Telephone Encounter (Signed)
Left message on answering machine. 

## 2013-10-13 ENCOUNTER — Ambulatory Visit (INDEPENDENT_AMBULATORY_CARE_PROVIDER_SITE_OTHER): Payer: Managed Care, Other (non HMO) | Admitting: Internal Medicine

## 2013-10-13 ENCOUNTER — Telehealth: Payer: Self-pay | Admitting: Internal Medicine

## 2013-10-13 ENCOUNTER — Encounter: Payer: Self-pay | Admitting: Internal Medicine

## 2013-10-13 VITALS — BP 146/104 | HR 71 | Temp 98.1°F | Wt 131.0 lb

## 2013-10-13 DIAGNOSIS — M549 Dorsalgia, unspecified: Secondary | ICD-10-CM

## 2013-10-13 DIAGNOSIS — M25559 Pain in unspecified hip: Secondary | ICD-10-CM | POA: Insufficient documentation

## 2013-10-13 DIAGNOSIS — M25552 Pain in left hip: Secondary | ICD-10-CM

## 2013-10-13 DIAGNOSIS — M5489 Other dorsalgia: Secondary | ICD-10-CM | POA: Insufficient documentation

## 2013-10-13 HISTORY — DX: Pain in unspecified hip: M25.559

## 2013-10-13 LAB — POCT URINALYSIS DIP (MANUAL ENTRY)
Bilirubin, UA: NEGATIVE
Blood, UA: NEGATIVE
Glucose, UA: NEGATIVE
Ketones, POC UA: NEGATIVE
Nitrite, UA: NEGATIVE
Urobilinogen, UA: 0.2

## 2013-10-13 MED ORDER — CELECOXIB 200 MG PO CAPS: 200.0000 mg | ORAL_CAPSULE | Freq: Every day | ORAL | Status: DC

## 2013-10-13 MED ORDER — CYCLOBENZAPRINE HCL 5 MG PO TABS: 5.0000 mg | ORAL_TABLET | Freq: Three times a day (TID) | ORAL | Status: DC | PRN

## 2013-10-13 NOTE — Progress Notes (Signed)
Chief Complaint  Patient presents with  . Hip Pain    Started last night at 9 pm.  Pt has taking OTC Tylenol and Aleve.    HPI: Patient comes in today for SDA for  new problem evaluation. Work in  Pt see CAN  Onset sub acute cresendo pain last night new onsetBefore bed worse with movement 10/10  And then could. sleep  . Not as bad with stillness. Feels like stabbing pain  Radiate to ?buttock and around side  does not radiate to her leg no associated symptoms such as fever nausea vomiting cough No fever.  Took  and tylenol.  No fever hemturia.  No falling .   No history of same back or hip problems. No history of kidney stones ROS: See pertinent positives and negatives per HPI. No fever falling new exercise . No rash    Past Medical History  Diagnosis Date  . High blood pressure     readings  . High cholesterol   . Migraine with aura     auras  . Thyroid disease   . Stroke     on hrt double vision- possible blood clot on brain; Dr. Lesia Sago at [facility]   . Pneumonia 2007    and flu hospitalized   . Hx of varicella   . Other and unspecified hyperlipidemia 04/27/2012    Has been on meds for a number of years .  Low dose crestor .  Baseline reported as 280/   . Non-cardiac chest pain     hops and evaluation 2011 dr Riley Kill  . CVA (cerebral infarction) 2010    Family History  Problem Relation Age of Onset  . Adopted: Yes  . Cancer Maternal Aunt     breast  . Other Mother     blood cancer disease in biological mom    History   Social History  . Marital Status: Married    Spouse Name: N/A    Number of Children: N/A  . Years of Education: N/A   Social History Main Topics  . Smoking status: Never Smoker   . Smokeless tobacco: Never Used  . Alcohol Use: No  . Drug Use: No  . Sexual Activity: Yes   Other Topics Concern  . 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    Neg tad   Some caffiene    Seatbelts no ets fa stored    G1P1   Physically active     5 yo grandaughter                    Outpatient Encounter Prescriptions as of 10/13/2013  Medication Sig  . aspirin 81 MG tablet Take 81 mg by mouth daily.  Marland Kitchen atorvastatin (LIPITOR) 10 MG tablet Take 5 mg by mouth daily.  . hydrochlorothiazide (HYDRODIURIL) 25 MG tablet TAKE 1 TABLET BY MOUTH EVERY DAY  . levothyroxine (SYNTHROID, LEVOTHROID) 75 MCG tablet TAKE 1 TABLET (75 MCG TOTAL) BY MOUTH DAILY.  Marland Kitchen potassium chloride (KLOR-CON M10) 10 MEQ tablet   . RESTASIS 0.05 % ophthalmic emulsion   . vitamin C (ASCORBIC ACID) 500 MG tablet Take 500 mg by mouth daily.  . celecoxib (CELEBREX) 200 MG  capsule Take 1 capsule (200 mg total) by mouth daily.  . cyclobenzaprine (FLEXERIL) 5 MG tablet Take 1 tablet (5 mg total) by mouth 3 (three) times daily as needed for muscle spasms.  . [DISCONTINUED] diclofenac sodium (VOLTAREN) 1 % GEL Apply 2 g topically 4 (four) times daily. Do for 1 month  . [DISCONTINUED] fluorometholone (FML) 0.1 % ophthalmic suspension   . [DISCONTINUED] neomycin-polymyxin b-dexamethasone (MAXITROL) 3.5-10000-0.1 OINT     EXAM:  BP 146/104  Pulse 71  Temp(Src) 98.1 F (36.7 C) (Oral)  Wt 131 lb (59.421 kg)  SpO2 97%  LMP 04/25/2009  Body mass index is 22.15 kg/(m^2).  GENERAL: vitals reviewed and listed above, alert, oriented, appears well hydrated and in mild to moderate distress when moves HEENT: atraumatic, conjunctiva  clear, no obvious abnormalities on inspection of external nose and ears NECK: no obvious masses on inspection palpation  CV: HRRR, no clubbing cyanosis or  peripheral edema nl cap refill  Back  No midline tender   Tender  lefrt para spinal sacral area   ? Lat hip  No going tenderness s walking ok   drts nl  Neg slr and nl le motor strength.  MS: moves all extremities  without noticeable focal  abnormality gait is cautious but facet pain is most when she is going from sitting to laying laying to sitting bending.  No groin pain. PSYCH: pleasant and cooperative, no obvious depression or anxiety ua trc leuk neg blood  ASSESSMENT AND PLAN:  Discussed the following assessment and plan:  Left paraspinal back pain - Possibly mechanical quite severe inclined toward all injection samples of Celebrex given in mild muscle relaxer close observation is warranted/ new onset - Plan: POCT urinalysis dipstick, Urine culture  Hip pain, acute, left - Plan: POCT urinalysis dipstick, Urine culture Expectant management followup. -Patient advised to return or notify health care team  if symptoms worsen or persist or new concerns arise.  Patient Instructions  This acts like mechanical back pain    antiinflammatory medication and activity at tolerated  . Mild muscle realxanat if needed to help sleep and spasm . Expect improvement in the next 3 days  If not a lot better in 2 weeks contact us . Pain should be gone in 4 weeks    Fu if  persistent or progressive  If fever new sx .       Neta Mends. Markan Cazarez M.D.  Pre visit review using our clinic review tool, if applicable. No additional management support is needed unless otherwise documented below in the visit note. Total visit > 50% spent counseling and coordinating care

## 2013-10-13 NOTE — Patient Instructions (Addendum)
This acts like mechanical back pain    antiinflammatory medication and activity at tolerated  . Mild muscle realxanat if needed to help sleep and spasm . Expect improvement in the next 3 days  If not a lot better in 2 weeks contact us . Pain should be gone in 4 weeks    Fu if  persistent or progressive  If fever new sx .

## 2013-10-13 NOTE — Telephone Encounter (Signed)
Patient Information:  Caller Name: Rosamaria  Phone: [phone]  Patient: Dana [name]  Gender: Female  DOB: [birth year]  Age: 57 Years  PCP: Berniece Andreas [facility])  Office Follow Up:  Does the office need to follow up with this patient?: No  Instructions For The Office: N/A  RN Note:  RN reached Go to office now  Disposition for severe pain.  No appts available with Dr. Fabian Sharp, and no availability with another provider within 4 hours. RN consulted Resource RN / Peri Jefferson and was advised to call office. RN spoke with Earl Gala Team Lead and she advised for pt to come to office now and Dr Rosezella Florida nurse will triage her and then pt to be worked with available provider and pt agreed.   Symptoms  Reason For Call & Symptoms: Today, 10/13/2013, pt calling about  onset left hip pain since last night. She took Tylenol last night with no relief , nor with Aleve this morning.   Constant dull pain   rating 7/10 with resting,  but increases  to 10/10  with movement. No injury.  Reviewed Health History In EMR: Yes  Reviewed Medications In EMR: Yes  Reviewed Allergies In EMR: Yes  Reviewed Surgeries / Procedures: Yes  Date of Onset of Symptoms: 10/12/2013  Treatments Tried: Tylenol, Aleve  Treatments Tried Worked: No  Guideline(s) Used:  Leg Pain  Disposition Per Guideline:   Go to Office Now  Reason For Disposition Reached:   Severe pain (e.g., excruciating, unable to do any normal activities)  Advice Given:   You become worse.  Patient Will Follow Care Advice:  YES

## 2013-10-13 NOTE — Telephone Encounter (Signed)
Pt to see WP today.  Will be placed on the schedule by front desk staff.

## 2013-10-15 LAB — URINE CULTURE

## 2013-10-15 NOTE — Progress Notes (Signed)
Quick Note:  Tell patient that urine culture Small amount of bacteria not consistent with a uti If not getting better then plan follow up. ______

## 2013-10-16 ENCOUNTER — Other Ambulatory Visit: Payer: Self-pay | Admitting: Internal Medicine

## 2013-10-18 ENCOUNTER — Ambulatory Visit: Payer: Managed Care, Other (non HMO) | Admitting: Internal Medicine

## 2013-10-20 ENCOUNTER — Telehealth: Payer: Self-pay | Admitting: Internal Medicine

## 2013-10-20 ENCOUNTER — Other Ambulatory Visit: Payer: Self-pay | Admitting: Family Medicine

## 2013-10-20 MED ORDER — HYDROCHLOROTHIAZIDE 25 MG PO TABS: ORAL_TABLET | ORAL | Status: DC

## 2013-10-20 MED ORDER — CELECOXIB 200 MG PO CAPS: 200.0000 mg | ORAL_CAPSULE | Freq: Every day | ORAL | Status: DC

## 2013-10-20 MED ORDER — POTASSIUM CHLORIDE CRYS ER 10 MEQ PO TBCR: 20.0000 meq | EXTENDED_RELEASE_TABLET | Freq: Every day | ORAL | Status: DC

## 2013-10-20 NOTE — Telephone Encounter (Signed)
Patient notified to pick up at the pharmacy. 

## 2013-10-20 NOTE — Telephone Encounter (Signed)
Pt was given samples of celebrex and would now like an rx for this [facility] [employer]

## 2013-10-20 NOTE — Telephone Encounter (Signed)
Ok to send in celebrex 200 mg 1 po qd for muscular skeletal pain  Disp 30 no refills

## 2013-11-02 ENCOUNTER — Other Ambulatory Visit: Payer: Self-pay | Admitting: Internal Medicine

## 2013-11-05 ENCOUNTER — Ambulatory Visit (INDEPENDENT_AMBULATORY_CARE_PROVIDER_SITE_OTHER): Payer: Managed Care, Other (non HMO) | Admitting: Podiatrist

## 2013-11-05 ENCOUNTER — Encounter: Payer: Self-pay | Admitting: Podiatrist

## 2013-11-05 VITALS — BP 143/82 | HR 70 | Resp 12 | Ht 66.0 in | Wt 130.0 lb

## 2013-11-05 DIAGNOSIS — M7150 Other bursitis, not elsewhere classified, unspecified site: Secondary | ICD-10-CM

## 2013-11-05 DIAGNOSIS — B351 Tinea unguium: Secondary | ICD-10-CM

## 2013-11-05 DIAGNOSIS — M779 Enthesopathy, unspecified: Secondary | ICD-10-CM

## 2013-11-05 MED ORDER — EFINACONAZOLE 10 % EX SOLN: 1.0000 [drp] | Freq: Every day | CUTANEOUS | Status: DC

## 2013-11-05 NOTE — Progress Notes (Signed)
   Subjective:    Patient ID: Dana [name], female    DOB: Dec 07, 1955, 57 y.o.   MRN: [id number]  HPI Comments: '' THE LT FOOT BIG TOENAIL HAVE DISCOLORATION AND TRIED TO TREATED WITH TOPICAL ANTI FUNGAL MED.''  Dana [name] presents today for discoloration of the left great toenail and minimal discoloration of the remainder of the toenails. She states she underwent laser therapy which helped however now she's noticed a whitish chalky appearance to the left hallux nail especially. She's tried topical therapies as dispensed by Dr. Wynelle Cleveland.    Review of Systems  Eyes:       DRY EYES  All other systems reviewed and are negative.       Objective:   Physical Exam Neurovascular status is intact bilateral. She has very superficial whitish streaks to the left hallux nail. The remainder of the nails 2, 3, 4 bilaterally are very thin and also have very superficial whitish streaks present as well. Possible mycotic infection versus dystrophy noted.       Assessment & Plan:  Onychomycosis left hallux and toenails 2, 3, 4 of both feet --superficial in nature   Plan: Wrote a prescription for Jublia and ordered it through [employer].  The patient also says she has pinched nerve in her back and at the orthotic coverage however once check orthotics are not covered from her insurance and she does not want pain out of pocket for them. She'll be seen back for recheck and possible re\re lasering of the toenails should do Jublia not be beneficial.  Marlowe Aschoff DPM

## 2013-11-05 NOTE — Patient Instructions (Signed)
You will receive a call from [phone].  This is the pharmacy I called your topical nail solution into-- they will confirm your address and send you the medication.  If you have not heard from them in 2-3 days.. Give them a call.

## 2013-11-09 ENCOUNTER — Other Ambulatory Visit: Payer: Self-pay | Admitting: Family Medicine

## 2013-11-09 MED ORDER — ATORVASTATIN CALCIUM 10 MG PO TABS: 5.0000 mg | ORAL_TABLET | Freq: Every day | ORAL | Status: DC

## 2013-11-15 ENCOUNTER — Other Ambulatory Visit (INDEPENDENT_AMBULATORY_CARE_PROVIDER_SITE_OTHER): Payer: Managed Care, Other (non HMO)

## 2013-11-15 DIAGNOSIS — Z0000 Encounter for general adult medical examination without abnormal findings: Secondary | ICD-10-CM

## 2013-11-15 LAB — CBC WITH DIFFERENTIAL/PLATELET
Basophils Relative: 0.5 % (ref 0.0–3.0)
Eosinophils Absolute: 0.5 10*3/uL (ref 0.0–0.7)
Hemoglobin: 12.7 g/dL (ref 12.0–15.0)
Lymphocytes Relative: 19.8 % (ref 12.0–46.0)
Lymphs Abs: 1.1 10*3/uL (ref 0.7–4.0)
MCHC: 33.5 g/dL (ref 30.0–36.0)
MCV: 89.8 fl (ref 78.0–100.0)
Monocytes Absolute: 0.6 10*3/uL (ref 0.1–1.0)
Neutro Abs: 3.2 10*3/uL (ref 1.4–7.7)
RBC: 4.23 Mil/uL (ref 3.87–5.11)

## 2013-11-15 LAB — BASIC METABOLIC PANEL
BUN: 15 mg/dL (ref 6–23)
CO2: 27 mEq/L (ref 19–32)
Chloride: 104 mEq/L (ref 96–112)
Potassium: 4.3 mEq/L (ref 3.5–5.1)
Sodium: 139 mEq/L (ref 135–145)

## 2013-11-15 LAB — LIPID PANEL
HDL: 70 mg/dL (ref >39.00)
LDL Cholesterol: 108 mg/dL — ABNORMAL HIGH (ref 0–99)
Total CHOL/HDL Ratio: 3

## 2013-11-15 LAB — TSH: TSH: 1.13 u[IU]/mL (ref 0.35–5.50)

## 2013-11-15 LAB — HEPATIC FUNCTION PANEL
AST: 26 U/L (ref 0–37)
Albumin: 4.1 g/dL (ref 3.5–5.2)
Alkaline Phosphatase: 106 U/L (ref 39–117)
Total Bilirubin: 0.5 mg/dL (ref 0.3–1.2)
Total Protein: 7.4 g/dL (ref 6.0–8.3)

## 2013-11-22 ENCOUNTER — Ambulatory Visit (INDEPENDENT_AMBULATORY_CARE_PROVIDER_SITE_OTHER): Payer: Managed Care, Other (non HMO) | Admitting: Internal Medicine

## 2013-11-22 ENCOUNTER — Encounter: Payer: Self-pay | Admitting: Internal Medicine

## 2013-11-22 VITALS — BP 120/78 | HR 66 | Temp 98.9°F | Ht 64.5 in | Wt 130.0 lb

## 2013-11-22 DIAGNOSIS — E785 Hyperlipidemia, unspecified: Secondary | ICD-10-CM

## 2013-11-22 DIAGNOSIS — E039 Hypothyroidism, unspecified: Secondary | ICD-10-CM

## 2013-11-22 DIAGNOSIS — Z0000 Encounter for general adult medical examination without abnormal findings: Secondary | ICD-10-CM

## 2013-11-22 DIAGNOSIS — I10 Essential (primary) hypertension: Secondary | ICD-10-CM

## 2013-11-22 MED ORDER — ATORVASTATIN CALCIUM 10 MG PO TABS: 10.0000 mg | ORAL_TABLET | Freq: Every day | ORAL | Status: DC

## 2013-11-22 NOTE — Patient Instructions (Signed)
Increase the lipitor 10 mg per day   Stay on the potassium 10 and hctz 25 and asa 81 mg  Same thyroid medication.    Repeat bp is good . Fu in 6 months lipids pre visit or as needed

## 2013-11-22 NOTE — Progress Notes (Signed)
Chief Complaint  Patient presents with  . Annual Exam  . Hypertension  . Hypothyroidism  . Hyperlipidemia    HPI: Patient comes in today for Preventive Health Care visit    exdercises   Helped   Back   abd discomfortt about the same  No changes.  Thyroid no change in meds   bp on hctz and potassium 10 meq per day   Lipids  Now on lipitor but 1/2 pill  No se of med   Has dry eyes on drops doesn't seem to have helped somewhat far. Sees eye doctor at home   Health Maintenance  Topic Date Due  . Tetanus/tdap  08/31/1973  . Influenza Vaccine  06/25/2014  . Mammogram  01/14/2015  . Pap Smear  04/21/2016  . Colonoscopy  09/29/2023   Health Maintenance Review  ROS:  GEN/ HEENT: No fever, significant weight changes sweats headaches vision problems hearing changes, CV/ PULM; No chest pain shortness of breath cough, syncope,edema  change in exercise tolerance. GI /GU: No adominal pain, vomiting, change in bowel habits. No blood in the stool. No significant GU symptoms. SKIN/HEME: ,no acute skin rashes suspicious lesions or bleeding. No lymphadenopathy, nodules, masses.  NEURO/ PSYCH:  No neurologic signs such as weakness numbness. No depression anxiety. IMM/ Allergy: No unusual infections.  Allergy .   REST of 12 system review negative except as per HPI   Past Medical History  Diagnosis Date  . High blood pressure     readings  . High cholesterol   . Migraine with aura     auras  . Thyroid disease   . Stroke     on hrt double vision- possible blood clot on brain; Dr. Lesia Sago at [facility]   . Pneumonia 2007    and flu hospitalized   . Hx of varicella   . Other and unspecified hyperlipidemia 04/27/2012    Has been on meds for a number of years .  Low dose crestor .  Baseline reported as 280/   . Non-cardiac chest pain     hops and evaluation 2011 dr Riley Kill  . CVA (cerebral infarction) 2010    Family History  Problem Relation Age of Onset  .  Adopted: Yes  . Cancer Maternal Aunt     breast  . Other Mother     blood cancer disease in biological mom    History   Social History  . Marital Status: Married    Spouse Name: N/A    Number of Children: N/A  . Years of Education: N/A   Social History Main Topics  . Smoking status: Never Smoker   . Smokeless tobacco: Never Used  . Alcohol Use: No  . Drug Use: No  . Sexual Activity: Yes   Other Topics Concern  . 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    Neg tad   Some caffiene    Seatbelts no ets fa stored    G1P1   Physically active     5 yo grandaughter                    Outpatient Encounter Prescriptions as of 11/22/2013  Medication Sig  . aspirin 81  MG tablet Take 81 mg by mouth daily.  Marland Kitchen atorvastatin (LIPITOR) 10 MG tablet Take 1 tablet (10 mg total) by mouth daily.  . Efinaconazole 10 % SOLN Apply 1 drop topically daily.  . hydrochlorothiazide (HYDRODIURIL) 25 MG tablet TAKE 1 TABLET EVERY DAY  . levothyroxine (SYNTHROID, LEVOTHROID) 75 MCG tablet TAKE 1 TABLET (75 MCG TOTAL) BY MOUTH DAILY.  Marland Kitchen potassium chloride (K-DUR,KLOR-CON) 10 MEQ tablet Take 10 mEq by mouth daily.  . RESTASIS 0.05 % ophthalmic emulsion   . vitamin C (ASCORBIC ACID) 500 MG tablet Take 500 mg by mouth daily.  . [DISCONTINUED] atorvastatin (LIPITOR) 10 MG tablet Take 0.5 tablets (5 mg total) by mouth daily.  . [DISCONTINUED] potassium chloride (KLOR-CON M10) 10 MEQ tablet Take 2 tablets (20 mEq total) by mouth daily.  . [DISCONTINUED] celecoxib (CELEBREX) 200 MG capsule Take 1 capsule (200 mg total) by mouth daily.  . [DISCONTINUED] celecoxib (CELEBREX) 200 MG capsule Take 1 capsule (200 mg total) by mouth daily.  . [DISCONTINUED] cyclobenzaprine (FLEXERIL) 5 MG tablet Take 1 tablet (5 mg total) by mouth 3  (three) times daily as needed for muscle spasms.  . [DISCONTINUED] VOLTAREN 1 % GEL     EXAM:  BP 120/78  Pulse 66  Temp(Src) 98.9 F (37.2 C) (Oral)  Ht 5' 4.5" (1.638 m)  Wt 130 lb (58.968 kg)  BMI 21.98 kg/m2  SpO2 97%  LMP 04/25/2009  Body mass index is 21.98 kg/(m^2).  Physical Exam: Vital signs reviewed VOZ:DGUY is a well-developed well-nourished alert cooperative   female who appears her stated age in no acute distress.  HEENT: normocephalic atraumatic , Eyes: PERRL EOM's full, conjunctiva clear, Nares: paten,t no deformity discharge or tenderness., Ears: no deformity EAC's clear TMs with normal landmarks. Mouth: clear OP, no lesions, edema.  Moist mucous membranes. Dentition in adequate repair. NECK: supple without masses, thyromegaly or bruits. CHEST/PULM:  Clear to auscultation and percussion breath sounds equal no wheeze , rales or rhonchi. No chest wall deformities or tenderness. Breast: normal by inspection . No dimpling, discharge, masses, tenderness or discharge . Implants in place  CV: PMI is nondisplaced, S1 S2 no gallops, murmurs, rubs. Peripheral pulses are full without delay.No JVD .  ABDOMEN: Bowel sounds normal nontender  No guard or rebound, no hepato splenomegal no CVA tenderness.  No hernia. Extremtities:  No clubbing cyanosis or edema, no acute joint swelling or redness no focal atrophy NEURO:  Oriented x3, cranial nerves 3-12 appear to be intact, no obvious focal weakness,gait within normal limits no abnormal reflexes or asymmetrical SKIN: No acute rashes normal turgor, color, no bruising or petechiae. PSYCH: Oriented, good eye contact, no obvious depression anxiety, cognition and judgment appear normal. LN: no cervical axillary inguinal adenopathy  Lab Results  Component Value Date   WBC 5.4 11/15/2013   HGB 12.7 11/15/2013   HCT 38.0 11/15/2013   PLT 218.0 11/15/2013   GLUCOSE 86 11/15/2013   CHOL 191 11/15/2013   TRIG 66.0 11/15/2013   HDL  70.00 11/15/2013   LDLCALC 108* 11/15/2013   ALT 28 11/15/2013   AST 26 11/15/2013   NA 139 11/15/2013   K 4.3 11/15/2013   CL 104 11/15/2013   CREATININE 0.7 11/15/2013   BUN 15 11/15/2013   CO2 27 11/15/2013   TSH 1.13 11/15/2013   INR 0.97 03/01/2010    ASSESSMENT AND PLAN:  Discussed the following assessment and plan:  Visit for preventive health examination  Other and unspecified hyperlipidemia - increase  lipitor to full tab to get ldl below 100 and preferred 70 range   Unspecified essential hypertension - better readings on repeat continue same meds   Unspecified hypothyroidism - same dose   Patient Care Team: Madelin Headings, MD as PCP - General (Internal Medicine) Herby Abraham, MD (Cardiology) Cherre Huger as Referring Physician Lenoard Aden, MD as Attending Physician (Obstetrics and Gynecology) Patient Instructions  Increase the lipitor 10 mg per day   Stay on the potassium 10 and hctz 25 and asa 81 mg  Same thyroid medication.    Repeat bp is good . Fu in 6 months lipids pre visit or as needed      Neta Mends. Drezden Seitzinger M.D.   Pre visit review using our clinic review tool, if applicable. No additional management support is needed unless otherwise documented below in the visit note.

## 2013-11-24 ENCOUNTER — Other Ambulatory Visit: Payer: Self-pay | Admitting: Family Medicine

## 2013-11-24 MED ORDER — LEVOTHYROXINE SODIUM 75 MCG PO TABS: ORAL_TABLET | ORAL | Status: DC

## 2013-11-30 ENCOUNTER — Other Ambulatory Visit: Payer: Self-pay | Admitting: Family Medicine

## 2013-11-30 MED ORDER — ATORVASTATIN CALCIUM 10 MG PO TABS: 10.0000 mg | ORAL_TABLET | Freq: Every day | ORAL | Status: DC

## 2014-02-04 ENCOUNTER — Other Ambulatory Visit: Payer: Self-pay | Admitting: Internal Medicine

## 2014-02-07 NOTE — Telephone Encounter (Signed)
Pt calling asking Misty to be on the lookout for faxes from [facility] home delivery for synthroid and htz.  Please advise.

## 2014-03-11 ENCOUNTER — Telehealth: Payer: Self-pay | Admitting: Internal Medicine

## 2014-03-11 NOTE — Telephone Encounter (Signed)
Patient advised I will add her to the wait list.  She has had this pain since last October.  She will also periodically call for an opening.

## 2014-04-01 ENCOUNTER — Encounter: Payer: Self-pay | Admitting: Gastroenterology

## 2014-04-13 ENCOUNTER — Ambulatory Visit (INDEPENDENT_AMBULATORY_CARE_PROVIDER_SITE_OTHER): Payer: Managed Care, Other (non HMO) | Admitting: Internal Medicine

## 2014-04-13 ENCOUNTER — Telehealth: Payer: Self-pay | Admitting: Internal Medicine

## 2014-04-13 ENCOUNTER — Encounter: Payer: Self-pay | Admitting: Internal Medicine

## 2014-04-13 VITALS — BP 112/70 | HR 72 | Ht 64.0 in | Wt 126.4 lb

## 2014-04-13 DIAGNOSIS — R10813 Right lower quadrant abdominal tenderness: Secondary | ICD-10-CM

## 2014-04-13 DIAGNOSIS — E785 Hyperlipidemia, unspecified: Secondary | ICD-10-CM

## 2014-04-13 NOTE — Assessment & Plan Note (Addendum)
Pelvic US to evaluate further (neg CT, GYN PE, labs, colonoscopy last year) ? Isolated neuropathic issue

## 2014-04-13 NOTE — Telephone Encounter (Addendum)
Pt instructed on avs from 10/2013 to fu in 6 months for lipid previsit. Can you put in the orders and anything else dr Fabian Sharppanosh may want for pt.?  appts have been scheduled.

## 2014-04-13 NOTE — Progress Notes (Signed)
         Subjective:    Patient ID: Dana [name], female    DOB: 12/08/1955, 58 y.o.   MRN: [id number]  HPI She is here with persistent discomfort/tenderness in RLQ. CT, GYN exam, colonoscopy unrevealing. Otherwise ok. Does not do exercises or repetitive motion in the area. No bowel/ballder problems. Seatbelt touching the area bothers it.  Medications, allergies, past medical history, past surgical history, family history and social history are reviewed and updated in the EMR.  Review of Systems As above    Objective:   Physical Exam WDWN NAD abd is soft and slightly tender in lower RLQ area, similar to but slightly lower than McBurney's point. Exam, supine, w/ mm tension and standing. No hernia. Not tender w/ mm tension.        Assessment & Plan:

## 2014-04-13 NOTE — Telephone Encounter (Signed)
Order placed in the system. 

## 2014-04-13 NOTE — Patient Instructions (Signed)
You have been scheduled for an pelvic and transvaginal ultrasound [facility]1st floor of hospital) on 04/19/14 at 10:00am. Please arrive 15 minutes prior to your appointment for registration. You do need to arrive with a full bladder. Should you need to reschedule your appointment, please contact radiology at [phone].   I appreciate the opportunity to care for you.

## 2014-04-19 ENCOUNTER — Ambulatory Visit (HOSPITAL_COMMUNITY)
Admission: RE | Admit: 2014-04-19 | Discharge: 2014-04-19 | Disposition: A | Discharge status: Home / Self Care | Payer: Managed Care, Other (non HMO) | Source: Ambulatory Visit | Attending: Internal Medicine | Admitting: Internal Medicine

## 2014-04-19 ENCOUNTER — Ambulatory Visit (HOSPITAL_COMMUNITY): Payer: Managed Care, Other (non HMO)

## 2014-04-19 DIAGNOSIS — R10813 Right lower quadrant abdominal tenderness: Secondary | ICD-10-CM | POA: Insufficient documentation

## 2014-04-19 DIAGNOSIS — R1031 Right lower quadrant pain: Secondary | ICD-10-CM | POA: Insufficient documentation

## 2014-04-19 DIAGNOSIS — Z780 Asymptomatic menopausal state: Secondary | ICD-10-CM | POA: Insufficient documentation

## 2014-04-19 NOTE — Progress Notes (Signed)
Quick Note:  [place] did not see her right ovary which most likely means it is very small Bottom line is no masses or problems identified No new recommendations - follow-up as needed ______

## 2014-04-22 ENCOUNTER — Other Ambulatory Visit (INDEPENDENT_AMBULATORY_CARE_PROVIDER_SITE_OTHER): Payer: Managed Care, Other (non HMO)

## 2014-04-22 DIAGNOSIS — E785 Hyperlipidemia, unspecified: Secondary | ICD-10-CM

## 2014-04-22 LAB — LIPID PANEL
CHOLESTEROL: 172 mg/dL (ref 0–200)
HDL: 78.8 mg/dL (ref >39.00)
LDL Cholesterol: 83 mg/dL (ref 0–99)
Total CHOL/HDL Ratio: 2
Triglycerides: 50 mg/dL (ref 0.0–149.0)
VLDL: 10 mg/dL (ref 0.0–40.0)

## 2014-04-25 ENCOUNTER — Ambulatory Visit (INDEPENDENT_AMBULATORY_CARE_PROVIDER_SITE_OTHER): Payer: Managed Care, Other (non HMO) | Admitting: Internal Medicine

## 2014-04-25 ENCOUNTER — Encounter: Payer: Self-pay | Admitting: Internal Medicine

## 2014-04-25 VITALS — BP 138/80 | HR 65 | Temp 98.8°F | Ht 64.5 in | Wt 126.0 lb

## 2014-04-25 DIAGNOSIS — G43109 Migraine with aura, not intractable, without status migrainosus: Secondary | ICD-10-CM

## 2014-04-25 DIAGNOSIS — E785 Hyperlipidemia, unspecified: Secondary | ICD-10-CM

## 2014-04-25 DIAGNOSIS — Z8679 Personal history of other diseases of the circulatory system: Secondary | ICD-10-CM

## 2014-04-25 DIAGNOSIS — I10 Essential (primary) hypertension: Secondary | ICD-10-CM

## 2014-04-25 MED ORDER — ATORVASTATIN CALCIUM 20 MG PO TABS: 20.0000 mg | ORAL_TABLET | Freq: Every day | ORAL | Status: DC

## 2014-04-25 NOTE — Patient Instructions (Signed)
Increase medicine as we discussed

## 2014-04-25 NOTE — Progress Notes (Signed)
Chief Complaint  Patient presents with  . Follow-up    HPI: Here for fu of lipid management and inc lipitor to 10 at last visit goal ldl poss 70 and below because of hxof mild cva has hx of migraine with aura Was on crestor but insurance not covered sister on lipitor and ? Wouldn't get out of bed.  No noted se of med but some same feelings no muscle aches  Per se.  ROS: See pertinent positives and negatives per HPI.  Past Medical History  Diagnosis Date  . High blood pressure     readings  . High cholesterol   . Migraine with aura     auras  . Thyroid disease   . Stroke     on hrt double vision- possible blood clot on brain; Dr. Lesia Sago at [facility]   . Pneumonia 2007    and flu hospitalized   . Hx of varicella   . Other and unspecified hyperlipidemia 04/27/2012    Has been on meds for a number of years .  Low dose crestor .  Baseline reported as 280/   . Non-cardiac chest pain     hops and evaluation 2011 dr Riley Kill  . CVA (cerebral infarction) 2010    Family History  Problem Relation Age of Onset  . Adopted: Yes  . Breast cancer Maternal Aunt   . Other Mother     blood cancer disease in biological mom    History   Social History  . Marital Status: Married    Spouse Name: N/A    Number of Children: N/A  . Years of Education: N/A   Social History Main Topics  . Smoking status: Never Smoker   . Smokeless tobacco: Never Used  . Alcohol Use: No  . Drug Use: No  . Sexual Activity: Yes   Other Topics Concern  . 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    Neg tad   Some caffiene    Seatbelts no ets fa stored    G1P1   Physically active     5 yo grandaughter                    Outpatient Encounter Prescriptions as of 04/25/2014  Medication Sig  . aspirin 81 MG  tablet Take 81 mg by mouth daily.  . hydrochlorothiazide (HYDRODIURIL) 25 MG tablet TAKE 1 TABLET EVERY DAY  . levothyroxine (SYNTHROID, LEVOTHROID) 75 MCG tablet TAKE 1 TABLET BY MOUTH DAILY  . potassium chloride (K-DUR,KLOR-CON) 10 MEQ tablet Take 10 mEq by mouth daily.  . [DISCONTINUED] atorvastatin (LIPITOR) 10 MG tablet Take 1 tablet (10 mg total) by mouth daily.  Marland Kitchen atorvastatin (LIPITOR) 20 MG tablet Take 1 tablet (20 mg total) by mouth daily.    EXAM:  BP 138/80  Pulse 65  Temp(Src) 98.8 F (37.1 C) (Oral)  Ht 5' 4.5" (1.638 m)  Wt 126 lb (57.153 kg)  BMI 21.30 kg/m2  SpO2 98%  LMP 04/25/2009  Body mass index is 21.3 kg/(m^2).  GENERAL: vitals reviewed and listed above, alert, oriented, appears well hydrated and in no acute distress MS: moves all extremities without noticeable focal  abnormality PSYCH: pleasant and cooperative,  no obvious depression or anxiety Lab Results  Component Value Date   WBC 5.4 11/15/2013   HGB 12.7 11/15/2013   HCT 38.0 11/15/2013   PLT 218.0 11/15/2013   GLUCOSE 86 11/15/2013   CHOL 172 04/22/2014   TRIG 50.0 04/22/2014   HDL 78.80 04/22/2014   LDLCALC 83 04/22/2014   ALT 28 11/15/2013   AST 26 11/15/2013   NA 139 11/15/2013   K 4.3 11/15/2013   CL 104 11/15/2013   CREATININE 0.7 11/15/2013   BUN 15 11/15/2013   CO2 27 11/15/2013   TSH 1.13 11/15/2013   INR 0.97 03/01/2010   Lab Results  Component Value Date   CHOL 172 04/22/2014   CHOL 191 11/15/2013   CHOL 161 10/20/2012   Lab Results  Component Value Date   HDL 78.80 04/22/2014   HDL 16.1070.00 11/15/2013   HDL 72.80 10/20/2012   Lab Results  Component Value Date   LDLCALC 83 04/22/2014   LDLCALC 108* 11/15/2013   LDLCALC 73 10/20/2012   Lab Results  Component Value Date   TRIG 50.0 04/22/2014   TRIG 66.0 11/15/2013   TRIG 74.0 10/20/2012   Lab Results  Component Value Date   CHOLHDL 2 04/22/2014   CHOLHDL 3 11/15/2013   CHOLHDL 2 10/20/2012   No results found for this  basename: LDLDIRECT    ASSESSMENT AND PLAN:  Discussed the following assessment and plan:  Other and unspecified hyperlipidemia - within 10% of goal by ldl criteria  cawse can be made for intesive rx based on new guidlines  Unspecified essential hypertension  Migraine with aura  CEREBROVASCULAR ACCIDENT, HX OF  intensify rx  Disc  40 + mg dosing   cause of concern about se and good response will inc to 20 mg instead of 40 - 80 and check at  6 month CPX  With labs.  -Patient advised to return or notify health care team  if symptoms worsen ,persist or new concerns arise.  Patient Instructions  Increase medicine as we discussed   Neta MendsWanda K. Minaal Struckman M.D.   Pre visit review using our clinic review tool, if applicable. No additional management support is needed unless otherwise documented below in the visit note.

## 2014-04-26 ENCOUNTER — Telehealth: Payer: Self-pay | Admitting: Internal Medicine

## 2014-04-26 LAB — HM MAMMOGRAPHY

## 2014-04-26 NOTE — Telephone Encounter (Signed)
Relevant patient education mailed to patient.  

## 2014-04-28 ENCOUNTER — Ambulatory Visit: Payer: Managed Care, Other (non HMO) | Admitting: Internal Medicine

## 2014-05-03 ENCOUNTER — Encounter: Payer: Self-pay | Admitting: Internal Medicine

## 2014-05-06 ENCOUNTER — Other Ambulatory Visit: Payer: Self-pay | Admitting: Internal Medicine

## 2014-05-09 NOTE — Telephone Encounter (Signed)
Sent to the pharmacy by e-scribe. 

## 2014-05-30 ENCOUNTER — Telehealth: Payer: Self-pay | Admitting: Internal Medicine

## 2014-05-30 NOTE — Telephone Encounter (Signed)
Pt would like to know if dr Fabian Sharppanosh would called in prescription strength Hemmoroid cream to [facility]/ [address] Pt has swollen Hemorid and is in pain.

## 2014-05-31 NOTE — Telephone Encounter (Signed)
Need more information  Usually otc are used such as tucks etc .

## 2014-05-31 NOTE — Telephone Encounter (Signed)
Called and spoke to the pt.  She has made an appt to be seen by Dr. Derrell Lollingamirez.  Will see him on Thursday.  Will wait to see what he says.  Does not wish for medication to be sent in at this time.    States she has 3 hemorrhoids.

## 2014-06-02 ENCOUNTER — Encounter (INDEPENDENT_AMBULATORY_CARE_PROVIDER_SITE_OTHER): Payer: Self-pay | Admitting: General Surgery

## 2014-06-02 ENCOUNTER — Ambulatory Visit (INDEPENDENT_AMBULATORY_CARE_PROVIDER_SITE_OTHER): Payer: Managed Care, Other (non HMO) | Admitting: General Surgery

## 2014-06-02 VITALS — BP 125/79 | HR 70 | Temp 97.7°F | Resp 18 | Ht 66.0 in | Wt 128.8 lb

## 2014-06-02 DIAGNOSIS — K644 Residual hemorrhoidal skin tags: Secondary | ICD-10-CM

## 2014-06-02 MED ORDER — HYDROCORTISONE ACETATE 25 MG RE SUPP: 25.0000 mg | Freq: Two times a day (BID) | RECTAL | Status: DC

## 2014-06-02 NOTE — Progress Notes (Signed)
Subjective:     Patient ID: Dana [name], female   DOB: 09/15/1956, 58 y.o.   MRN: [id number]  HPI The patient is a 58 year old female who arrives today secondary to hemorrhoidal pain. He states in the last 3-4 days patient's had pain and inflammation of her hemorrhoids.  The patient states that she is irritation of hemorrhoids since birth of her child 30+ years ago. She does state she has a history of constipation. She states she stands at work and a standing work station.  Review of Systems  Constitutional: Negative.   HENT: Negative.   Respiratory: Negative.   Cardiovascular: Negative.   Gastrointestinal: Negative.   Neurological: Negative.   All other systems reviewed and are negative.      Objective:   Physical Exam  Constitutional: She is oriented to person, place, and time. She appears well-developed and well-nourished.  HENT:  Head: Normocephalic and atraumatic.  Eyes: Conjunctivae and EOM are normal. Pupils are equal, round, and reactive to light.  Neck: Normal range of motion. Neck supple.  Cardiovascular: Normal rate, regular rhythm and normal heart sounds.   Pulmonary/Chest: Effort normal and breath sounds normal.  Abdominal: Soft. Bowel sounds are normal.  Genitourinary:     Musculoskeletal: Normal range of motion.  Neurological: She is alert and oriented to person, place, and time.  Skin: Skin is warm and dry.  Psychiatric: She has a normal mood and affect.       Assessment:     58 year old female with external hemorrhoids, nonthrombosed     Plan:      1. I will give the patient prescription for Anusol suppositories. I recommend her continuous sitz baths. 2. Recommend that she take stool softener Colace or MiraLax to help with chronic constipation. 3. Patient follow up as needed

## 2014-09-26 ENCOUNTER — Encounter (INDEPENDENT_AMBULATORY_CARE_PROVIDER_SITE_OTHER): Payer: Self-pay | Admitting: General Surgery

## 2014-10-22 ENCOUNTER — Other Ambulatory Visit: Payer: Self-pay | Admitting: Internal Medicine

## 2014-10-24 NOTE — Telephone Encounter (Signed)
Sent to the pharmacy by e-scribe.  Pt has upcoming CPX on 11/23/14 

## 2014-11-06 ENCOUNTER — Other Ambulatory Visit: Payer: Self-pay | Admitting: Internal Medicine

## 2014-11-07 NOTE — Telephone Encounter (Signed)
Sent to the pharmacy by e-scribe.  Pt has upcoming wellness exam on 11/23/14

## 2014-11-16 ENCOUNTER — Other Ambulatory Visit (INDEPENDENT_AMBULATORY_CARE_PROVIDER_SITE_OTHER): Payer: Managed Care, Other (non HMO)

## 2014-11-16 DIAGNOSIS — Z0000 Encounter for general adult medical examination without abnormal findings: Secondary | ICD-10-CM

## 2014-11-16 DIAGNOSIS — Z23 Encounter for immunization: Secondary | ICD-10-CM

## 2014-11-16 LAB — LIPID PANEL
CHOL/HDL RATIO: 3
Cholesterol: 175 mg/dL (ref 0–200)
HDL: 66.2 mg/dL (ref >39.00)
LDL Cholesterol: 91 mg/dL (ref 0–99)
NONHDL: 108.8
Triglycerides: 91 mg/dL (ref 0.0–149.0)
VLDL: 18.2 mg/dL (ref 0.0–40.0)

## 2014-11-16 LAB — CBC WITH DIFFERENTIAL/PLATELET
BASOS PCT: 0.5 % (ref 0.0–3.0)
Basophils Absolute: 0 10*3/uL (ref 0.0–0.1)
Eosinophils Absolute: 0.5 10*3/uL (ref 0.0–0.7)
Eosinophils Relative: 6.3 % — ABNORMAL HIGH (ref 0.0–5.0)
HEMATOCRIT: 40.2 % (ref 36.0–46.0)
HEMOGLOBIN: 13.2 g/dL (ref 12.0–15.0)
LYMPHS PCT: 14.6 % (ref 12.0–46.0)
Lymphs Abs: 1.1 10*3/uL (ref 0.7–4.0)
MCHC: 32.9 g/dL (ref 30.0–36.0)
MCV: 91.9 fl (ref 78.0–100.0)
Monocytes Absolute: 0.6 10*3/uL (ref 0.1–1.0)
Monocytes Relative: 8.3 % (ref 3.0–12.0)
NEUTROS ABS: 5.2 10*3/uL (ref 1.4–7.7)
Neutrophils Relative %: 70.3 % (ref 43.0–77.0)
Platelets: 219 10*3/uL (ref 150.0–400.0)
RBC: 4.37 Mil/uL (ref 3.87–5.11)
RDW: 14.1 % (ref 11.5–15.5)
WBC: 7.4 10*3/uL (ref 4.0–10.5)

## 2014-11-16 LAB — HEPATIC FUNCTION PANEL
ALBUMIN: 4.2 g/dL (ref 3.5–5.2)
ALK PHOS: 123 U/L — AB (ref 39–117)
ALT: 37 U/L — AB (ref 0–35)
AST: 37 U/L (ref 0–37)
Bilirubin, Direct: 0.1 mg/dL (ref 0.0–0.3)
Total Bilirubin: 0.4 mg/dL (ref 0.2–1.2)
Total Protein: 7.5 g/dL (ref 6.0–8.3)

## 2014-11-16 LAB — BASIC METABOLIC PANEL
BUN: 12 mg/dL (ref 6–23)
CO2: 27 meq/L (ref 19–32)
Calcium: 10 mg/dL (ref 8.4–10.5)
Chloride: 105 mEq/L (ref 96–112)
Creatinine, Ser: 0.9 mg/dL (ref 0.4–1.2)
GFR: 70.54 mL/min (ref >60.00)
Glucose, Bld: 87 mg/dL (ref 70–99)
POTASSIUM: 4.8 meq/L (ref 3.5–5.1)
SODIUM: 139 meq/L (ref 135–145)

## 2014-11-16 LAB — TSH: TSH: 6.41 u[IU]/mL — ABNORMAL HIGH (ref 0.35–4.50)

## 2014-11-23 ENCOUNTER — Ambulatory Visit (INDEPENDENT_AMBULATORY_CARE_PROVIDER_SITE_OTHER): Payer: Managed Care, Other (non HMO) | Admitting: Internal Medicine

## 2014-11-23 ENCOUNTER — Encounter: Payer: Self-pay | Admitting: Internal Medicine

## 2014-11-23 VITALS — BP 136/78 | Temp 98.2°F | Ht 64.0 in | Wt 129.6 lb

## 2014-11-23 DIAGNOSIS — R7989 Other specified abnormal findings of blood chemistry: Secondary | ICD-10-CM

## 2014-11-23 DIAGNOSIS — E039 Hypothyroidism, unspecified: Secondary | ICD-10-CM

## 2014-11-23 DIAGNOSIS — Z0000 Encounter for general adult medical examination without abnormal findings: Secondary | ICD-10-CM

## 2014-11-23 DIAGNOSIS — R945 Abnormal results of liver function studies: Secondary | ICD-10-CM

## 2014-11-23 DIAGNOSIS — I10 Essential (primary) hypertension: Secondary | ICD-10-CM | POA: Insufficient documentation

## 2014-11-23 DIAGNOSIS — R29898 Other symptoms and signs involving the musculoskeletal system: Secondary | ICD-10-CM

## 2014-11-23 DIAGNOSIS — E785 Hyperlipidemia, unspecified: Secondary | ICD-10-CM

## 2014-11-23 HISTORY — DX: Other specified abnormal findings of blood chemistry: R79.89

## 2014-11-23 LAB — VITAMIN D 25 HYDROXY (VIT D DEFICIENCY, FRACTURES): VITD: 20.62 ng/mL — AB (ref 30.00–100.00)

## 2014-11-23 LAB — HEPATIC FUNCTION PANEL
ALT: 33 U/L (ref 0–35)
AST: 33 U/L (ref 0–37)
Albumin: 4.1 g/dL (ref 3.5–5.2)
Alkaline Phosphatase: 125 U/L — ABNORMAL HIGH (ref 39–117)
Bilirubin, Direct: 0 mg/dL (ref 0.0–0.3)
Total Bilirubin: 0.4 mg/dL (ref 0.2–1.2)
Total Protein: 7.6 g/dL (ref 6.0–8.3)

## 2014-11-23 LAB — GAMMA GT: GGT: 51 U/L (ref 7–51)

## 2014-11-23 LAB — TSH: TSH: 5.67 u[IU]/mL — ABNORMAL HIGH (ref 0.35–4.50)

## 2014-11-23 NOTE — Progress Notes (Signed)
Pre visit review using our clinic review tool, if applicable. No additional management support is needed unless otherwise documented below in the visit note.  Chief Complaint  Patient presents with  . Annual Exam    HPI: Patient  Dana [name]  58 y.o. comes in today for Preventive Health Care visit  Concern about some sx worried about heart attack :   In the fall  Went to [place]  Getting out car and and had seconds   Arms heavy and lasted  3 second lasted  and thought was going to collapse . Not week after  And better  The next am vomited  No other sx .  Then in november  Left arm  At desk  weak feeling and went away quick  In fall. Immediately  2-3 seconds  Feels find now no cp sog exercise intolerance  Thyroid  Taking med every day .  taking with other pills  Some tired .  90   LIPID med no se noted  Take asa 81 perr day per neuro from past.   Health Maintenance  Topic Date Due  . ZOSTAVAX  08/31/2014  . TETANUS/TDAP  04/26/2015 (Originally 08/31/1975)  . INFLUENZA VACCINE  06/26/2015  . PAP SMEAR  04/21/2016  . MAMMOGRAM  04/26/2016  . COLONOSCOPY  09/29/2023   Health Maintenance Review LIFESTYLE:  Exercise:  Walks a lot but   3 rd  Stories    Stand up desk  Tobacco/ETS:  no Alcohol: no Sugar beverages: no Sleep: hot flushes night sweats   Drug use: no Bone density:  Colonoscopy: utd  PAP:utd nomal   Dr [place] [facility]. MAMMO:utd   ROS:  GEN/ HEENT: No fever, significant weight changes sweats headaches vision problems hearing changes, CV/ PULM; No chest pain shortness of breath cough, syncope,edema  change in exercise tolerance. GI /GU: No adominal pain, vomiting, change in bowel habits. No blood in the stool. No significant GU symptoms. SKIN/HEME: ,no acute skin rashes suspicious lesions or bleeding. No lymphadenopathy, nodules, masses.  NEURO/ PSYCH:  No neurologic signs such as weakness numbness.except see above hpi No depression anxiety. IMM/ Allergy: No unusual  infections.  Allergy .   REST of 12 system review negative except as per HPI   Past Medical History  Diagnosis Date  . High blood pressure     readings  . High cholesterol   . Migraine with aura     auras  . Thyroid disease   . Stroke     on hrt double vision- possible blood clot on brain; Dr. Floyde Parkins at [facility]   . Pneumonia 2007    and flu hospitalized   . Hx of varicella   . Other and unspecified hyperlipidemia 04/27/2012    Has been on meds for a number of years .  Low dose crestor .  Baseline reported as 280/   . Non-cardiac chest pain     hops and evaluation 2011 dr Lia Foyer  . CVA (cerebral infarction) 2010    diplopia neg acute MRi whit matter changes neg LP     Past Surgical History  Procedure Laterality Date  . Tonsillectomy    . Foot surgery      had bunyon removed  . Breast surgery      implants  . Colonoscopy      Family History  Problem Relation Age of Onset  . Adopted: Yes  . Breast cancer Maternal Aunt   . Other Mother  blood cancer disease in biological mom    History   Social History  . Marital Status: Married    Spouse Name: N/A    Number of Children: N/A  . Years of Education: N/A   Social History Main Topics  . Smoking status: Never Smoker   . Smokeless tobacco: Never Used  . Alcohol Use: No  . Drug Use: No  . Sexual Activity: Yes   Other Topics Concern  . 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    Neg tad   Some caffiene    Seatbelts no ets fa stored    G1P1   Physically active     60 yo grandaughter           Outpatient Encounter Prescriptions as of 11/23/2014  Medication Sig  . aspirin 81 MG tablet Take 81 mg by mouth daily.  Marland Kitchen atorvastatin (LIPITOR) 20 MG tablet Take 1 tablet (20 mg total) by mouth daily.  .  hydrochlorothiazide (HYDRODIURIL) 25 MG tablet TAKE 1 TABLET BY MOUTH EVERY DAY  . levothyroxine (SYNTHROID, LEVOTHROID) 75 MCG tablet TAKE 1 TABLET BY MOUTH DAILY  . potassium chloride (K-DUR,KLOR-CON) 10 MEQ tablet Take 10 mEq by mouth daily.  . [DISCONTINUED] potassium chloride (K-DUR,KLOR-CON) 10 MEQ tablet TAKE 2 TABLETS BY MOUTH DAILY  . [DISCONTINUED] hydrocortisone (ANUSOL-HC) 25 MG suppository Place 1 suppository (25 mg total) rectally 2 (two) times daily.    EXAM:  BP 136/78 mmHg  Temp(Src) 98.2 F (36.8 C) (Oral)  Ht 5' 4"  (1.626 m)  Wt 129 lb 9.6 oz (58.786 kg)  BMI 22.23 kg/m2  LMP 04/25/2009  Body mass index is 22.23 kg/(m^2).  Physical Exam: Vital signs reviewed GLO:VFIE is a well-developed well-nourished alert cooperative    who appearsr stated age in no acute distress.  HEENT: normocephalic atraumatic , Eyes: PERRL EOM's full, conjunctiva clear, Nares: paten,t no deformity discharge or tenderness., Ears: no deformity EAC's clear TMs with normal landmarks. Mouth: clear OP, no lesions, edema.  Moist mucous membranes. Dentition in adequate repair. NECK: supple without masses, thyromegaly or bruits. CHEST/PULM:  Clear to auscultation and percussion breath sounds equal no wheeze , rales or rhonchi. No chest wall deformities or tenderness. CV: PMI is nondisplaced, S1 S2 no gallops, murmurs, rubs. Peripheral pulses are full without delay.No JVD .  Breast implants no nodules  ABDOMEN: Bowel sounds normal nontender  No guard or rebound, no hepato splenomegal no CVA tenderness.  No hernia. Extremtities:  No clubbing cyanosis or edema, no acute joint swelling or redness no focal atrophy NEURO:  Oriented x3, cranial nerves 3-12 appear to be intact, no obvious focal weakness,gait within normal limits no abnormal reflexes or asymmetrical SKIN: No acute rashes normal turgor, color, no bruising or petechiae. PSYCH: Oriented, good eye contact, no obvious depression anxiety, cognition  and judgment appear normal. LN: no cervical axillary inguinal adenopathy  Lab Results  Component Value Date   WBC 7.4 11/16/2014   HGB 13.2 11/16/2014   HCT 40.2 11/16/2014   PLT 219.0 11/16/2014   GLUCOSE 87 11/16/2014   CHOL 175 11/16/2014   TRIG 91.0 11/16/2014   HDL 66.20 11/16/2014   LDLCALC 91 11/16/2014   ALT 33 11/23/2014  AST 33 11/23/2014   NA 139 11/16/2014   K 4.8 11/16/2014   CL 105 11/16/2014   CREATININE 0.9 11/16/2014   BUN 12 11/16/2014   CO2 27 11/16/2014   TSH 5.67* 11/23/2014   INR 0.97 03/01/2010   EKG nsr dec forces v3 but no qs  Lat ekg more ant forces in v3 but otherwise no change ? Lead placement ASSESSMENT AND PLAN:  Discussed the following assessment and plan:  Visit for preventive health examination  Hypothyroidism, unspecified hypothyroidism type - adjsut dose - Plan: TSH, Hepatic function panel, Gamma GT, Vit D  25 hydroxy (rtn osteoporosis monitoring), Hepatitis C antibody, Hepatitis B surface antigen, Hepatitis B surface antibody  Essential hypertension - Plan: EKG 12-Lead, TSH, Hepatic function panel, Gamma GT, Vit D  25 hydroxy (rtn osteoporosis monitoring), Hepatitis C antibody, Hepatitis B surface antigen, Hepatitis B surface antibody  Hyperlipidemia - no ch meds - Plan: TSH, Hepatic function panel, Gamma GT, Vit D  25 hydroxy (rtn osteoporosis monitoring), Hepatitis C antibody, Hepatitis B surface antigen, Hepatitis B surface antibody  Bilateral arm weakness  hx of  - tranient episode  seconds no residual  no neck sx hx vascular on esterogen disc neuro if recurrs  - Plan: EKG 12-Lead, TSH, Hepatic function panel, Gamma GT, Vit D  25 hydroxy (rtn osteoporosis monitoring), Hepatitis C antibody, Hepatitis B surface antigen, Hepatitis B surface antibody  Abnormal LFTs - alk phos etc ? from thryoid labs today  - Plan: TSH, Hepatic function panel, Gamma GT, Vit D  25 hydroxy (rtn osteoporosis monitoring), Hepatitis C antibody, Hepatitis B  surface antigen, Hepatitis B surface antibody Plan labs and correct thyroid if  Recurrent arm sx get neuro to see . Pt wants to hol off ofr now  Can inc to 2 asa berday 160 mg  Patient Care Team: Burnis Medin, MD as PCP - General (Internal Medicine) Hillary Bow, MD (Cardiology) Grant Fontana, DC as Referring Physician Lovenia Kim, MD as Attending Physician (Obstetrics and Gynecology) Patient Instructions  Uncertain cause of the arm weak episodes . Exam is good today. ekg is good.  Change in one lead may be lead placement  Only.   Can get cards to review.  Plan getting  Neurology  to see you for opinion.  If recurs More blood tests today for thyroid vit d  Liver panel.  Take thyroid med without  Food  1 hour before any other meds first thing in am .  Increase the asa to 325 mg per day in the interim. Will notify you  of labs when available. And then adjsut medication as appropriate .    Healthy lifestyle includes : At least 150 minutes of exercise weeks  , weight at healthy levels, which is usually   BMI 19-25. Avoid trans fats and processed foods;  Increase fresh fruits and veges to 5 servings per day. And avoid sweet beverages including tea and juice. Mediterranean diet with olive oil and nuts have been noted to be heart and brain healthy . Avoid tobacco products . Limit  alcohol to  7 per week for women and 14 servings for men.  Get adequate sleep . Wear seat belts . Don't text and drive .     Standley Brooking. Nealy Hickmon M.D.

## 2014-11-23 NOTE — Patient Instructions (Addendum)
Uncertain cause of the arm weak episodes . Exam is good today. ekg is good.  Change in one lead may be lead placement  Only.   Can get cards to review.  Plan getting  Neurology  to see you for opinion.  If recurs More blood tests today for thyroid vit d  Liver panel.  Take thyroid med without  Food  1 hour before any other meds first thing in am .  Increase the asa to 325 mg per day in the interim. Will notify you  of labs when available. And then adjsut medication as appropriate .    Healthy lifestyle includes : At least 150 minutes of exercise weeks  , weight at healthy levels, which is usually   BMI 19-25. Avoid trans fats and processed foods;  Increase fresh fruits and veges to 5 servings per day. And avoid sweet beverages including tea and juice. Mediterranean diet with olive oil and nuts have been noted to be heart and brain healthy . Avoid tobacco products . Limit  alcohol to  7 per week for women and 14 servings for men.  Get adequate sleep . Wear seat belts . Don't text and drive .

## 2014-11-24 ENCOUNTER — Encounter: Payer: Self-pay | Admitting: Internal Medicine

## 2014-11-24 DIAGNOSIS — Z0000 Encounter for general adult medical examination without abnormal findings: Secondary | ICD-10-CM | POA: Insufficient documentation

## 2014-11-24 LAB — HEPATITIS B SURFACE ANTIGEN: HEP B S AG: NEGATIVE

## 2014-11-24 LAB — HEPATITIS B SURFACE ANTIBODY,QUALITATIVE: HEP B S AB: NEGATIVE

## 2014-11-24 LAB — HEPATITIS C ANTIBODY: HCV Ab: NEGATIVE

## 2014-11-28 ENCOUNTER — Telehealth: Payer: Self-pay | Admitting: Internal Medicine

## 2014-11-28 NOTE — Telephone Encounter (Signed)
Pt needs blood work results °

## 2014-11-29 ENCOUNTER — Other Ambulatory Visit: Payer: Self-pay | Admitting: Family Medicine

## 2014-11-29 DIAGNOSIS — R748 Abnormal levels of other serum enzymes: Secondary | ICD-10-CM

## 2014-11-29 DIAGNOSIS — E039 Hypothyroidism, unspecified: Secondary | ICD-10-CM

## 2014-11-29 DIAGNOSIS — R7989 Other specified abnormal findings of blood chemistry: Secondary | ICD-10-CM

## 2014-11-29 MED ORDER — LEVOTHYROXINE SODIUM 88 MCG PO TABS: 88.0000 ug | ORAL_TABLET | Freq: Every day | ORAL | Status: DC

## 2014-11-29 MED ORDER — ATORVASTATIN CALCIUM 20 MG PO TABS: 20.0000 mg | ORAL_TABLET | Freq: Every day | ORAL | Status: DC

## 2014-11-29 NOTE — Telephone Encounter (Signed)
Pt called back again today about her lab results. She said she is almost out of her thyroid medicine

## 2014-11-29 NOTE — Telephone Encounter (Signed)
Pt notified of results.  New rx sent to the pharmacy.

## 2014-12-23 ENCOUNTER — Telehealth: Payer: Self-pay | Admitting: Internal Medicine

## 2014-12-23 DIAGNOSIS — R29898 Other symptoms and signs involving the musculoskeletal system: Secondary | ICD-10-CM

## 2014-12-23 NOTE — Telephone Encounter (Signed)
Per OV note on 11/23/14, WP states if episodes are reoccurring than will send to neuro.  Placed order in the system.

## 2014-12-23 NOTE — Telephone Encounter (Signed)
Pt said Dr Fabian SharpPanosh told her if she has another episode with her arm to give her a call and she would refer her to Dr Anne HahnWillis.

## 2015-01-06 ENCOUNTER — Institutional Professional Consult (permissible substitution): Payer: Managed Care, Other (non HMO) | Admitting: Neurology

## 2015-01-10 ENCOUNTER — Encounter: Payer: Self-pay | Admitting: Neurology

## 2015-01-10 ENCOUNTER — Ambulatory Visit (INDEPENDENT_AMBULATORY_CARE_PROVIDER_SITE_OTHER): Payer: Managed Care, Other (non HMO) | Admitting: Neurology

## 2015-01-10 VITALS — BP 114/71 | HR 69 | Ht 66.0 in | Wt 132.6 lb

## 2015-01-10 DIAGNOSIS — R930 Abnormal findings on diagnostic imaging of skull and head, not elsewhere classified: Secondary | ICD-10-CM

## 2015-01-10 DIAGNOSIS — R29898 Other symptoms and signs involving the musculoskeletal system: Secondary | ICD-10-CM

## 2015-01-10 DIAGNOSIS — G43109 Migraine with aura, not intractable, without status migrainosus: Secondary | ICD-10-CM

## 2015-01-10 DIAGNOSIS — R9089 Other abnormal findings on diagnostic imaging of central nervous system: Secondary | ICD-10-CM

## 2015-01-10 HISTORY — DX: Other abnormal findings on diagnostic imaging of central nervous system: R90.89

## 2015-01-10 NOTE — Progress Notes (Signed)
Reason for visit: Arm weakness  Dana [name] is a 59 y.o. female  History of present illness:  [name] is a 59 year old left-handed white female with a history of an abnormal MRI of the brain in the past that was discovered following a bout of double vision. The patient was seen in October 2010. MRI evaluation did show white matter abnormalities that could be associated with demyelinating disease. Lumbar puncture was done, and this was unremarkable along with blood work. The patient was set up for a transcranial Doppler bubble study that showed evidence of a moderate size patent foramen ovale. The patient was placed on low-dose aspirin. The patient indicates that around 07/27/2014 she had a brief episode of bilateral arm weakness. This last only a few seconds. The next morning, she had some nausea and vomiting. The patient was concerned that she had cardiac issues. The patient had an event of transient left arm weakness again lasting several seconds several weeks ago. In October 2015, the patient had transient left arm numbness lasting several seconds. She denies any chest pain, dizziness, double vision, or palpitations of the heart. She does have a history of migraine headaches. The patient will have colors in the vision, some occasional wavy lines or alteration the left peripheral visual field prior to the onset of the headache. The patient denies any neck pain or pain down arms. She denies any balance issues or difficulty controlling the bowels or the bladder. The patient was placed on 325 mg aspirin, but he could not tolerate this secondary to stomach upset. The patient is back on 81 mg of aspirin. She is sent to this office for an evaluation.  Past Medical History  Diagnosis Date  . High blood pressure     readings  . High cholesterol   . Migraine with aura     auras  . Thyroid disease   . Stroke     on hrt double vision- possible blood clot on brain; [facility]ith [facility]   . Pneumonia 2007    and flu hospitalized   . Hx of varicella   . Other and unspecified hyperlipidemia 04/27/2012    Has been on meds for a number of years .  Low dose crestor .  Baseline reported as 280/   . Non-cardiac chest pain     hops and evaluation 2011 dr Riley KillStuckey  . CVA (cerebral infarction) 2010    diplopia neg acute MRi whit matter changes neg LP   . Abnormal finding on MRI of brain 01/10/2015  . PFO (patent foramen ovale)     Moderate size    Past Surgical History  Procedure Laterality Date  . Tonsillectomy    . Foot surgery      had bunyon removed  . Breast surgery      implants  . Colonoscopy      Family History  Problem Relation Age of Onset  . Adopted: Yes  . Breast cancer Maternal Aunt   . Cancer Mother     pancreatic  . Alcohol abuse Mother     Social history:  reports that she has never smoked. She has never used smokeless tobacco. She reports that she does not drink alcohol or use illicit drugs.  Medications:  Current Outpatient Prescriptions on File Prior to Visit  Medication Sig Dispense Refill  . aspirin 81 MG tablet Take 81 mg by mouth daily.    Marland Kitchen. atorvastatin (LIPITOR) 20 MG tablet Take 1  tablet (20 mg total) by mouth daily. 90 tablet 3  . hydrochlorothiazide (HYDRODIURIL) 25 MG tablet TAKE 1 TABLET BY MOUTH EVERY DAY 90 tablet 0  . levothyroxine (SYNTHROID, LEVOTHROID) 88 MCG tablet Take 1 tablet (88 mcg total) by mouth daily. 90 tablet 1  . potassium chloride (K-DUR,KLOR-CON) 10 MEQ tablet Take 10 mEq by mouth daily.     No current facility-administered medications on file prior to visit.      Allergies  Allergen Reactions  . Estrogens Other (See Comments)    Had stroke on HRT/OCP  . Codeine     REACTION: nausea  . Ibuprofen     REACTION: nause    ROS:  Out of a complete 14 system review of symptoms, the patient complains only of the following symptoms, and all other reviewed systems are negative.  Numbness,  weakness  Blood pressure 114/71, pulse 69, height  (1.676 m), weight 132 lb 9.6 oz (60.147 kg), last menstrual period 04/25/2009.  Physical Exam  General: The patient is alert and cooperative at the time of the examination.  Eyes: Pupils are equal, round, and reactive to light. Discs are flat bilaterally.  Neck: The neck is supple, no carotid bruits are noted.  Respiratory: The respiratory examination is clear.  Cardiovascular: The cardiovascular examination reveals a regular rate and rhythm, no obvious murmurs or rubs are noted.  Skin: Extremities are without significant edema.  Neurologic Exam  Mental status: The patient is alert and oriented x 3 at the time of the examination. The patient has apparent normal recent and remote memory, with an apparently normal attention span and concentration ability.  Cranial nerves: Facial symmetry is present. There is good sensation of the face to pinprick and soft touch bilaterally. The strength of the facial muscles and the muscles to head turning and shoulder shrug are normal bilaterally. Speech is well enunciated, no aphasia or dysarthria is noted. Extraocular movements are full. Visual fields are full. The tongue is midline, and the patient has symmetric elevation of the soft palate. No obvious hearing deficits are noted.  Motor: The motor testing reveals 5 over 5 strength of all 4 extremities. Good symmetric motor tone is noted throughout.  Sensory: Sensory testing is intact to pinprick, soft touch, vibration sensation, and position sense on all 4 extremities. No evidence of extinction is noted.  Coordination: Cerebellar testing reveals good finger-nose-finger and heel-to-shin bilaterally.  Gait and station: Gait is normal. Tandem gait is normal. Romberg is negative. No drift is seen.  Reflexes: Deep tendon reflexes are symmetric and normal bilaterally. Toes are downgoing bilaterally.    MRI brain 09/11/09:  IMPRESSION: 1. No  evidence of acute ischemia. 2. Non specific subcortical white matter changes supratentorially. Given the patient's age and gender, a demyelinating process would be a prime consideration. Other differential considerations would be small vessel type disease changes due to chronic hypertension and/or diabetes versus vasculitis. 3. Mild inflammatory thickening of the mucosa of the paranasal Sinuses.   Assessment/Plan:  1. Episodic weakness, sensory changes  2. Abnormal MRI brain  3. Migraine headache  The patient is having episodes of transient arm weakness or numbness lasting only a few seconds. The etiology of these events may be difficult to determine. The events are somewhat atypical for true TIA episodes or for demyelinating events. The patient is to remain on low-dose aspirin, she will be set up for MRI evaluation of the brain with MRA of the head and neck. She will follow-up through this  office if needed. If the studies are unremarkable, no further workup will be done.  Marlan Palau MD 01/10/2015 7:12 PM  [facility] [address] [place], [place] [address]  Phone [phone] Fax [phone]

## 2015-01-13 ENCOUNTER — Telehealth: Payer: Self-pay

## 2015-01-24 ENCOUNTER — Other Ambulatory Visit: Payer: Self-pay | Admitting: Internal Medicine

## 2015-01-24 NOTE — Telephone Encounter (Signed)
Denied.  Filled for 6 months in Jan.  Refill request is too soon.

## 2015-01-25 ENCOUNTER — Ambulatory Visit (INDEPENDENT_AMBULATORY_CARE_PROVIDER_SITE_OTHER): Payer: Managed Care, Other (non HMO)

## 2015-01-25 DIAGNOSIS — G43109 Migraine with aura, not intractable, without status migrainosus: Secondary | ICD-10-CM

## 2015-01-25 DIAGNOSIS — R29898 Other symptoms and signs involving the musculoskeletal system: Secondary | ICD-10-CM | POA: Diagnosis not present

## 2015-01-25 DIAGNOSIS — R930 Abnormal findings on diagnostic imaging of skull and head, not elsewhere classified: Secondary | ICD-10-CM

## 2015-01-25 DIAGNOSIS — R9089 Other abnormal findings on diagnostic imaging of central nervous system: Secondary | ICD-10-CM

## 2015-01-25 MED ORDER — GADOPENTETATE DIMEGLUMINE 469.01 MG/ML IV SOLN: 20.0000 mL | Freq: Once | INTRAVENOUS | Status: AC | PRN

## 2015-01-28 ENCOUNTER — Telehealth: Payer: Self-pay | Admitting: Neurology

## 2015-01-28 NOTE — Telephone Encounter (Signed)
I called patient. I discussed the results of the MRI of the brain and MRA of the head and neck. Minimal change in white matter lesions from 5 years ago. The patient has a patent foramen ovale, this may be the source of the white matter changes. The patient is to contact me if she has persistent new symptoms.   MRI brain 01/26/2015:  IMPRESSION:  Abnormal MRI brain (without) demonstrating: 1. Scattered periventricular and subcortical foci of T2 hyperintensities. These findings are non-specific and considerations include autoimmune, inflammatory, post-infectious, microvascular ischemic or migraine associated etiologies.  2. No acute findings. 3. Compared to MRI from 11/05/10, there has been minimal increase in the T2 hyperintensities in the right frontal region.   MRA head January 26, 2015:  IMPRESSION:  Normal MRA head (without). No change from MRA on 09/11/09.   MRA neck 01/26/2015:  IMPRESSION:  Normal MRA neck (with and without). No change from MRA on 09/11/09.

## 2015-02-07 ENCOUNTER — Other Ambulatory Visit (INDEPENDENT_AMBULATORY_CARE_PROVIDER_SITE_OTHER): Payer: Managed Care, Other (non HMO)

## 2015-02-07 DIAGNOSIS — R748 Abnormal levels of other serum enzymes: Secondary | ICD-10-CM

## 2015-02-07 DIAGNOSIS — E039 Hypothyroidism, unspecified: Secondary | ICD-10-CM

## 2015-02-07 DIAGNOSIS — R7989 Other specified abnormal findings of blood chemistry: Secondary | ICD-10-CM

## 2015-02-07 LAB — ALKALINE PHOSPHATASE: ALK PHOS: 138 U/L — AB (ref 39–117)

## 2015-02-07 LAB — VITAMIN D 25 HYDROXY (VIT D DEFICIENCY, FRACTURES): VITD: 31.98 ng/mL (ref 30.00–100.00)

## 2015-02-07 LAB — TSH: TSH: 0.39 u[IU]/mL (ref 0.35–4.50)

## 2015-02-14 ENCOUNTER — Encounter: Payer: Self-pay | Admitting: Internal Medicine

## 2015-02-14 ENCOUNTER — Ambulatory Visit: Payer: Managed Care, Other (non HMO) | Admitting: Internal Medicine

## 2015-02-14 ENCOUNTER — Ambulatory Visit (INDEPENDENT_AMBULATORY_CARE_PROVIDER_SITE_OTHER): Payer: Managed Care, Other (non HMO) | Admitting: Internal Medicine

## 2015-02-14 VITALS — BP 140/86 | Temp 98.2°F | Ht 64.0 in | Wt 131.0 lb

## 2015-02-14 DIAGNOSIS — Q211 Atrial septal defect: Secondary | ICD-10-CM | POA: Insufficient documentation

## 2015-02-14 DIAGNOSIS — N951 Menopausal and female climacteric states: Secondary | ICD-10-CM

## 2015-02-14 DIAGNOSIS — E039 Hypothyroidism, unspecified: Secondary | ICD-10-CM

## 2015-02-14 DIAGNOSIS — R748 Abnormal levels of other serum enzymes: Secondary | ICD-10-CM | POA: Diagnosis not present

## 2015-02-14 DIAGNOSIS — E785 Hyperlipidemia, unspecified: Secondary | ICD-10-CM

## 2015-02-14 DIAGNOSIS — Q2112 Patent foramen ovale: Secondary | ICD-10-CM | POA: Insufficient documentation

## 2015-02-14 MED ORDER — PAROXETINE HCL 10 MG PO TABS: 10.0000 mg | ORAL_TABLET | Freq: Every day | ORAL | Status: DC

## 2015-02-14 MED ORDER — LEVOTHYROXINE SODIUM 88 MCG PO TABS: 88.0000 ug | ORAL_TABLET | Freq: Every day | ORAL | Status: DC

## 2015-02-14 MED ORDER — ATORVASTATIN CALCIUM 20 MG PO TABS: 20.0000 mg | ORAL_TABLET | Freq: Every day | ORAL | Status: DC

## 2015-02-14 MED ORDER — HYDROCHLOROTHIAZIDE 25 MG PO TABS: 25.0000 mg | ORAL_TABLET | Freq: Every day | ORAL | Status: DC

## 2015-02-14 NOTE — Progress Notes (Signed)
Pre visit review using our clinic review tool, if applicable. No additional management support is needed unless otherwise documented below in the visit note.  Chief Complaint  Patient presents with  . Follow-up    HPI: Dana [name] 59 y.o. comes in for  Fu  elevaetd alk phos and had eval by neuro for atypical arm weakness sx  wiuth minimal findings on mri   White matter lesinos Poss related to migraines no further wu at this time but to take asa  jhas a list of ?  Had cramps with vit d  Prep. husband tried too so stopped  Fish oil for dry eyes   No help Folic acid  428   Daughter working   Taking thyroid med before meals needs refill Explain lab results  Dr Jannifer Franklin said could have PDS but think she means PFO and on asa  Cannot tolerate 325 so on 81 mg Hot flushes are problematic  ? anyt hink to take  Tried black cophas but has se and hesitant about this ROS: See pertinent positives and negatives per HPI.no cp sob   Past Medical History  Diagnosis Date  . High blood pressure     readings  . High cholesterol   . Migraine with aura     auras  . Thyroid disease   . Stroke     on hrt double vision- possible blood clot on brain; Dr. Floyde Parkins at [facility]   . Pneumonia 2007    and flu hospitalized   . Hx of varicella   . Other and unspecified hyperlipidemia 04/27/2012    Has been on meds for a number of years .  Low dose crestor .  Baseline reported as 280/   . Non-cardiac chest pain     hops and evaluation 2011 dr Lia Foyer  . CVA (cerebral infarction) 2010    diplopia neg acute MRi whit matter changes neg LP   . Abnormal finding on MRI of brain 01/10/2015  . PFO (patent foramen ovale)     Moderate size    Family History  Problem Relation Age of Onset  . Adopted: Yes  . Breast cancer Maternal Aunt   . Cancer Mother     pancreatic  . Alcohol abuse Mother     History   Social History  . Marital Status: Married    Spouse Name: N/A  . Number of Children: 1    . Years of Education: N/A   Social History Main Topics  . Smoking status: Never Smoker   . Smokeless tobacco: Never Used  . Alcohol Use: No  . Drug Use: No  . Sexual Activity: Yes   Other Topics Concern  . 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    Neg tad   Some caffiene    Seatbelts no ets fa stored    G1P1   Physically active     15 yo grandaughter    Patient is left handed.          Outpatient Encounter Prescriptions as of 02/14/2015  Medication Sig  . aspirin 81 MG tablet Take 81 mg by mouth daily.  Marland Kitchen atorvastatin (LIPITOR) 20 MG tablet Take 1  tablet (20 mg total) by mouth daily.  . Black Cohosh 40 MG CAPS Take 1 capsule by mouth 2 (two) times daily.  . cycloSPORINE (RESTASIS) 0.05 % ophthalmic emulsion 2 drops daily.  . folic acid (FOLVITE) 161 MCG tablet Take 400 mcg by mouth daily.  Marland Kitchen levothyroxine (SYNTHROID, LEVOTHROID) 88 MCG tablet Take 1 tablet (88 mcg total) by mouth daily.  . Omega-3 Fatty Acids (OMEGA-3 FISH OIL) 300 MG CAPS Take by mouth.  . potassium chloride (K-DUR,KLOR-CON) 10 MEQ tablet Take 10 mEq by mouth daily.  . [DISCONTINUED] atorvastatin (LIPITOR) 20 MG tablet Take 1 tablet (20 mg total) by mouth daily.  . [DISCONTINUED] levothyroxine (SYNTHROID, LEVOTHROID) 88 MCG tablet Take 1 tablet (88 mcg total) by mouth daily.  . hydrochlorothiazide (HYDRODIURIL) 25 MG tablet Take 1 tablet (25 mg total) by mouth daily.  Marland Kitchen PARoxetine (PAXIL) 10 MG tablet Take 1 tablet (10 mg total) by mouth daily.  . [DISCONTINUED] Cholecalciferol (VITAMIN D3) 2000 UNITS TABS Take 2,000 Units by mouth daily.  . [DISCONTINUED] hydrochlorothiazide (HYDRODIURIL) 25 MG tablet TAKE 1 TABLET BY MOUTH EVERY DAY (Patient not taking: Reported on 02/14/2015)    EXAM:  BP 140/86 mmHg  Temp(Src)  98.2 F (36.8 C) (Oral)  Ht 5' 4"  (1.626 m)  Wt 131 lb (59.421 kg)  BMI 22.47 kg/m2  LMP 04/25/2009  Body mass index is 22.47 kg/(m^2).  GENERAL: vitals reviewed and listed above, alert, oriented, appears well hydrated and in no acute distress MS: moves all extremities without noticeable focal  abnormality PSYCH: pleasant and cooperative, no obvious depression or anxiety Lab Results  Component Value Date   WBC 7.4 11/16/2014   HGB 13.2 11/16/2014   HCT 40.2 11/16/2014   PLT 219.0 11/16/2014   GLUCOSE 87 11/16/2014   CHOL 175 11/16/2014   TRIG 91.0 11/16/2014   HDL 66.20 11/16/2014   LDLCALC 91 11/16/2014   ALT 33 11/23/2014   AST 33 11/23/2014   NA 139 11/16/2014   K 4.8 11/16/2014   CL 105 11/16/2014   CREATININE 0.9 11/16/2014   BUN 12 11/16/2014   CO2 27 11/16/2014   TSH 0.39 02/07/2015   INR 0.97 03/01/2010   Lab Results  Component Value Date   ALKPHOS 138* 02/07/2015     ASSESSMENT AND PLAN:  Discussed the following assessment and plan:  Hypothyroidism, unspecified hypothyroidism type - Plan: TSH, Alkaline phosphatase, Nucleotidase, 5', blood, Sedimentation rate, PTH, intact and calcium  Elevated alkaline phosphatase measurement - Plan: TSH, Alkaline phosphatase, Nucleotidase, 5', blood, Sedimentation rate, PTH, intact and calcium  Menopausal hot flushes - trial paxil consdier xr cr  - Plan: TSH, Alkaline phosphatase, Nucleotidase, 5', blood, Sedimentation rate, PTH, intact and calcium  Hyperlipidemia - refill med   PFO (patent foramen ovale) tsh better vit d better alk phos still slightly elevated  But nl GGT  Thus not felt to be liver related  Suggest  Follow at this time   Back to 81 mg per day . Asa Disc options hot flushes wants to try low dose paxil ( non branded ) newer product 7.5 consideration  But trial generic first  VIt d is better  Had se of supplement 2000 take 800 - 1000iu Has pfo and not pda but chart review -Patient advised to return  or notify health care team  if symptoms worsen ,persist or new concerns arise. Total visit 27mns > 50% spent counseling and coordinating care     Patient Instructions  Thyroid is better  Continue same dose. Alkaline phosphatase is not coming from your liver   Only barely elevated and we will follow up in about 3 months .   Can try low dose paxil for hot flushes  Can take vit d 800- 100 iu per day   Patent foramen ovale  Is common and diagnosed by echo test . Usually take asa for this .      Standley Brooking. Stiles Maxcy M.D.

## 2015-02-14 NOTE — Patient Instructions (Signed)
Thyroid is better  Continue same dose. Alkaline phosphatase is not coming from your liver   Only barely elevated and we will follow up in about 3 months .   Can try low dose paxil for hot flushes  Can take vit d 800- 100 iu per day   Patent foramen ovale  Is common and diagnosed by echo test . Usually take asa for this .

## 2015-04-13 ENCOUNTER — Telehealth: Payer: Self-pay | Admitting: Internal Medicine

## 2015-04-13 MED ORDER — POTASSIUM CHLORIDE CRYS ER 10 MEQ PO TBCR: 20.0000 meq | EXTENDED_RELEASE_TABLET | Freq: Every day | ORAL | Status: DC

## 2015-04-13 NOTE — Telephone Encounter (Signed)
i am not sure why med list is like that but she has been on  It . Ok to rx  For 6 months  Confirm dosage is correct.

## 2015-04-13 NOTE — Telephone Encounter (Signed)
Sent to the pharmacy by e-scribe.  Confirmed with the patient that she is taking 2 tabs daily.

## 2015-04-13 NOTE — Telephone Encounter (Signed)
Pt request refill of the following: potassium chloride (K-DUR,KLOR-CON) 10 MEQ tablet   Pt said she change her pharmacy and [facility]    [facility]  [address]

## 2015-04-13 NOTE — Telephone Encounter (Signed)
Do not see this medication mentioned in last OV note or last CPX note.  [facility] with [facility] for authorization.

## 2015-05-04 LAB — HM MAMMOGRAPHY

## 2015-05-05 ENCOUNTER — Encounter: Payer: Self-pay | Admitting: Family Medicine

## 2015-05-24 ENCOUNTER — Other Ambulatory Visit: Payer: Self-pay | Admitting: Internal Medicine

## 2015-05-24 NOTE — Telephone Encounter (Signed)
Filled in March for one year.  Request is too soon.

## 2015-06-01 ENCOUNTER — Ambulatory Visit: Payer: Managed Care, Other (non HMO) | Admitting: Family Medicine

## 2015-06-01 ENCOUNTER — Ambulatory Visit (INDEPENDENT_AMBULATORY_CARE_PROVIDER_SITE_OTHER): Payer: Managed Care, Other (non HMO) | Admitting: Internal Medicine

## 2015-06-01 ENCOUNTER — Encounter: Payer: Self-pay | Admitting: Internal Medicine

## 2015-06-01 VITALS — BP 120/80 | Temp 98.4°F | Ht 64.0 in | Wt 128.7 lb

## 2015-06-01 DIAGNOSIS — M545 Low back pain, unspecified: Secondary | ICD-10-CM

## 2015-06-01 LAB — POCT URINALYSIS DIPSTICK
Bilirubin, UA: NEGATIVE
Glucose, UA: NEGATIVE
Ketones, UA: NEGATIVE
NITRITE UA: NEGATIVE
PH UA: 6
Protein, UA: NEGATIVE
RBC UA: NEGATIVE
Spec Grav, UA: 1.01
UROBILINOGEN UA: 0.2

## 2015-06-01 MED ORDER — MELOXICAM 15 MG PO TABS: 7.5000 mg | ORAL_TABLET | Freq: Every day | ORAL | Status: DC

## 2015-06-01 NOTE — Progress Notes (Signed)
Pre visit review using our clinic review tool, if applicable. No additional management support is needed unless otherwise documented below in the visit note.  Chief Complaint  Patient presents with  . Rt Hip Pain    Ongoing since Monday    HPI: Patient Dana [name]  comes in today for SDA for  new problem evaluation. Onset  Monday 3-4 days ago  Hx of transient pain  In past. Not down the lef. Pain is worse when sitting down or driving after a while not radiating down leg or up back no UTI symptoms or hematuria fever. No specific injury or lifting but has been working in the yard with her new house. Has taken lots of Tylenol not a lot of relief almost took Aleve but it bothers her stomach. No weakness numbness of lower extremity or significant abdominal pain with the situation.  No hx of sig trauma  In past to back.  No fever .  ROS: See pertinent positives and negatives per HPI.  Past Medical History  Diagnosis Date  . High blood pressure     readings  . High cholesterol   . Migraine with aura     auras  . Thyroid disease   . Stroke     on hrt double vision- possible blood clot on brain; Dr. Lesia Sago at [facility]   . Pneumonia 2007    and flu hospitalized   . Hx of varicella   . Other and unspecified hyperlipidemia 04/27/2012    Has been on meds for a number of years .  Low dose crestor .  Baseline reported as 280/   . Non-cardiac chest pain     hops and evaluation 2011 dr Riley Kill  . CVA (cerebral infarction) 2010    diplopia neg acute MRi whit matter changes neg LP   . Abnormal finding on MRI of brain 01/10/2015  . PFO (patent foramen ovale)     Moderate size    Family History  Problem Relation Age of Onset  . Adopted: Yes  . Breast cancer Maternal Aunt   . Cancer Mother     pancreatic  . Alcohol abuse Mother     History   Social History  . Marital Status: Married    Spouse Name: N/A  . Number of Children: 1  . Years of Education: N/A   Social  History Main Topics  . Smoking status: Never Smoker   . Smokeless tobacco: Never Used  . Alcohol Use: No  . Drug Use: No  . Sexual Activity: Yes   Other Topics Concern  . 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    Neg tad   Some caffiene    Seatbelts no ets fa stored    G1P1   Physically active     5 yo grandaughter    Patient is left handed.          Outpatient Prescriptions Prior to Visit  Medication Sig Dispense Refill  . aspirin 81 MG tablet Take 81 mg by mouth daily.    Marland Kitchen atorvastatin (LIPITOR) 20 MG tablet Take 1 tablet (20 mg total) by mouth daily. 90 tablet 3  . hydrochlorothiazide (HYDRODIURIL) 25  MG tablet Take 1 tablet (25 mg total) by mouth daily. 90 tablet 3  . levothyroxine (SYNTHROID, LEVOTHROID) 88 MCG tablet Take 1 tablet (88 mcg total) by mouth daily. 90 tablet 3  . potassium chloride (K-DUR,KLOR-CON) 10 MEQ tablet Take 2 tablets (20 mEq total) by mouth daily. 180 tablet 1  . PARoxetine (PAXIL) 10 MG tablet Take 1 tablet (10 mg total) by mouth daily. 90 tablet 1  . Black Cohosh 40 MG CAPS Take 1 capsule by mouth 2 (two) times daily.    . cycloSPORINE (RESTASIS) 0.05 % ophthalmic emulsion 2 drops daily.    . folic acid (FOLVITE) 800 MCG tablet Take 400 mcg by mouth daily.    . Omega-3 Fatty Acids (OMEGA-3 FISH OIL) 300 MG CAPS Take by mouth.    . potassium chloride (K-DUR,KLOR-CON) 10 MEQ tablet Take 10 mEq by mouth daily.     No facility-administered medications prior to visit.     EXAM:  BP 120/80 mmHg  Temp(Src) 98.4 F (36.9 C) (Oral)  Ht 5\' 4"  (1.626 m)  Wt 128 lb 11.2 oz (58.378 kg)  BMI 22.08 kg/m2  LMP 04/25/2009  Body mass index is 22.08 kg/(m^2).  GENERAL: vitals reviewed and listed above, alert, oriented, appears well hydrated and in no acute  distress points to right lower back SI area were uncomfortable sitting standing she looks normal with normal gait and 2 inch heels HEENT: atraumatic, conjunctiva  clear, no obvious abnormalities on inspection of external nose and ears NECK: Abdomen soft without organomegaly guarding or rebound no midline bony tenderness or SI tenderness points to right lower back localized negative SLR DTRs are intact no motor deficits CV: HRRR, no clubbing cyanosis or  peripheral edema nl cap refill  MS: moves all extremities without noticeable focal  abnormality PSYCH: pleasant and cooperative, no obvious depression or anxiety UA clear trc leuk ASSESSMENT AND PLAN:  Discussed the following assessment and plan:  Right-sided low back pain without sciatica - Appears mechanical no alarm findings activity as tolerated no lifting short-term anti-inflammatory's with GI protection follow-up as discussed back exercises gi - Plan: POCT urinalysis dipstick Ho back exercise  Trial of meloxicam may be less GI irritating if taken with omeprazole in the short term. -Patient advised to return or notify health care team  if symptoms worsen ,persist or new concerns arise.  Patient Instructions  This acts like mechanical back pain ,   Often Worse with sitting.   Trial of inflammatory  Med with stomach protection  Take otc omeprazole  Once a day while on the meloxicam  .  If  persistent or progressive we can get sports  medicine or ortho to see you for  Further help.    Back Pain, Adult Low back pain is very common. About 1 in 5 people have back pain.The cause of low back pain is rarely dangerous. The pain often gets better over time.About half of people with a sudden onset of back pain feel better in just 2 weeks. About 8 in 10 people feel better by 6 weeks.  CAUSES Some common causes of back pain include:  Strain of the muscles or ligaments supporting the spine.  Wear and tear (degeneration) of the spinal  discs.  Arthritis.  Direct injury to the back. DIAGNOSIS Most of the time, the direct cause of low back pain is not known.However, back pain can be treated effectively even when the exact cause of the pain is unknown.Answering your caregiver's questions about your  overall health and symptoms is one of the most accurate ways to make sure the cause of your pain is not dangerous. If your caregiver needs more information, he or she may order lab work or imaging tests (X-rays or MRIs).However, even if imaging tests show changes in your back, this usually does not require surgery. HOME CARE INSTRUCTIONS For many people, back pain returns.Since low back pain is rarely dangerous, it is often a condition that people can learn to [facility] their own.   Remain active. It is stressful on the back to sit or stand in one place. Do not sit, drive, or stand in one place for more than 30 minutes at a time. Take short walks on level surfaces as soon as pain allows.Try to increase the length of time you walk each day.  Do not stay in bed.Resting more than 1 or 2 days can delay your recovery.  Do not avoid exercise or work.Your body is made to move.It is not dangerous to be active, even though your back may hurt.Your back will likely heal faster if you return to being active before your pain is gone.  Pay attention to your body when you bend and lift. Many people have less discomfortwhen lifting if they bend their knees, keep the load close to their bodies,and avoid twisting. Often, the most comfortable positions are those that put less stress on your recovering back.  Find a comfortable position to sleep. Use a firm mattress and lie on your side with your knees slightly bent. If you lie on your back, put a pillow under your knees.  Only take over-the-counter or prescription medicines as directed by your caregiver. Over-the-counter medicines to reduce pain and inflammation are often the most  helpful.Your caregiver may prescribe muscle relaxant drugs.These medicines help dull your pain so you can more quickly return to your normal activities and healthy exercise.  Put ice on the injured area.  Put ice in a plastic bag.  Place a towel between your skin and the bag.  Leave the ice on for 15-20 minutes, 03-04 times a day for the first 2 to 3 days. After that, ice and heat may be alternated to reduce pain and spasms.  Ask your caregiver about trying back exercises and gentle massage. This may be of some benefit.  Avoid feeling anxious or stressed.Stress increases muscle tension and can worsen back pain.It is important to recognize when you are anxious or stressed and learn ways to manage it.Exercise is a great option. SEEK MEDICAL CARE IF:  You have pain that is not relieved with rest or medicine.  You have pain that does not improve in 1 week.  You have new symptoms.  You are generally not feeling well. SEEK IMMEDIATE MEDICAL CARE IF:   You have pain that radiates from your back into your legs.  You develop new bowel or bladder control problems.  You have unusual weakness or numbness in your arms or legs.  You develop nausea or vomiting.  You develop abdominal pain.  You feel faint. Document Released: 11/11/2005 Document Revised: 05/12/2012 Document Reviewed: 03/15/2014 [facility] Patient Information 2015 [place], [place]LL[place]. This information is not intended to replace advice given to you by your health care provider. Make sure you discuss any questions you have with your health care provider.      Neta MendsWanda K. Panosh M.D.

## 2015-06-01 NOTE — Patient Instructions (Addendum)
This acts like mechanical back pain ,   Often Worse with sitting.   Trial of inflammatory  Med with stomach protection  Take otc omeprazole  Once a day while on the meloxicam  .  If  persistent or progressive we can get sports  medicine or ortho to see you for  Further help.    Back Pain, Adult Low back pain is very common. About 1 in 5 people have back pain.The cause of low back pain is rarely dangerous. The pain often gets better over time.About half of people with a sudden onset of back pain feel better in just 2 weeks. About 8 in 10 people feel better by 6 weeks.  CAUSES Some common causes of back pain include:  Strain of the muscles or ligaments supporting the spine.  Wear and tear (degeneration) of the spinal discs.  Arthritis.  Direct injury to the back. DIAGNOSIS Most of the time, the direct cause of low back pain is not known.However, back pain can be treated effectively even when the exact cause of the pain is unknown.Answering your caregiver's questions about your overall health and symptoms is one of the most accurate ways to make sure the cause of your pain is not dangerous. If your caregiver needs more information, he or she may order lab work or imaging tests (X-rays or MRIs).However, even if imaging tests show changes in your back, this usually does not require surgery. HOME CARE INSTRUCTIONS For many people, back pain returns.Since low back pain is rarely dangerous, it is often a condition that people can learn to [facility] their own.   Remain active. It is stressful on the back to sit or stand in one place. Do not sit, drive, or stand in one place for more than 30 minutes at a time. Take short walks on level surfaces as soon as pain allows.Try to increase the length of time you walk each day.  Do not stay in bed.Resting more than 1 or 2 days can delay your recovery.  Do not avoid exercise or work.Your body is made to move.It is not dangerous to be active, even  though your back may hurt.Your back will likely heal faster if you return to being active before your pain is gone.  Pay attention to your body when you bend and lift. Many people have less discomfortwhen lifting if they bend their knees, keep the load close to their bodies,and avoid twisting. Often, the most comfortable positions are those that put less stress on your recovering back.  Find a comfortable position to sleep. Use a firm mattress and lie on your side with your knees slightly bent. If you lie on your back, put a pillow under your knees.  Only take over-the-counter or prescription medicines as directed by your caregiver. Over-the-counter medicines to reduce pain and inflammation are often the most helpful.Your caregiver may prescribe muscle relaxant drugs.These medicines help dull your pain so you can more quickly return to your normal activities and healthy exercise.  Put ice on the injured area.  Put ice in a plastic bag.  Place a towel between your skin and the bag.  Leave the ice on for 15-20 minutes, 03-04 times a day for the first 2 to 3 days. After that, ice and heat may be alternated to reduce pain and spasms.  Ask your caregiver about trying back exercises and gentle massage. This may be of some benefit.  Avoid feeling anxious or stressed.Stress increases muscle tension and can worsen back  pain.It is important to recognize when you are anxious or stressed and learn ways to manage it.Exercise is a great option. SEEK MEDICAL CARE IF:  You have pain that is not relieved with rest or medicine.  You have pain that does not improve in 1 week.  You have new symptoms.  You are generally not feeling well. SEEK IMMEDIATE MEDICAL CARE IF:   You have pain that radiates from your back into your legs.  You develop new bowel or bladder control problems.  You have unusual weakness or numbness in your arms or legs.  You develop nausea or vomiting.  You develop  abdominal pain.  You feel faint. Document Released: 11/11/2005 Document Revised: 05/12/2012 Document Reviewed: 03/15/2014 [facility]ExitCare Patient Information 2015 [place]Care, MarylandLLC. This information is not intended to replace advice given to you by your health care provider. Make sure you discuss any questions you have with your health care provider.

## 2015-09-07 ENCOUNTER — Ambulatory Visit (INDEPENDENT_AMBULATORY_CARE_PROVIDER_SITE_OTHER): Payer: Managed Care, Other (non HMO) | Admitting: Family Medicine

## 2015-09-07 DIAGNOSIS — Z23 Encounter for immunization: Secondary | ICD-10-CM | POA: Diagnosis not present

## 2015-10-04 ENCOUNTER — Encounter: Payer: Self-pay | Admitting: Family Medicine

## 2015-10-04 ENCOUNTER — Ambulatory Visit (INDEPENDENT_AMBULATORY_CARE_PROVIDER_SITE_OTHER): Payer: Managed Care, Other (non HMO) | Admitting: Family Medicine

## 2015-10-04 VITALS — BP 102/84 | HR 94 | Temp 98.3°F | Ht 64.0 in | Wt 129.9 lb

## 2015-10-04 DIAGNOSIS — J069 Acute upper respiratory infection, unspecified: Secondary | ICD-10-CM | POA: Diagnosis not present

## 2015-10-04 NOTE — Progress Notes (Signed)
HPI:  URI: -started: 4 days ago, she thinks it is a cold but her husband wanted her to come to the doctor -symptoms:nasal congestion, sore throat, cough -denies:fever, SOB, NVD, tooth pain -has tried: tylenol and musinex -sick contacts/travel/risks: denies flu exposure, tick exposure or or Ebola risks  ROS: See pertinent positives and negatives per HPI.  Past Medical History  Diagnosis Date  . High blood pressure     readings  . High cholesterol   . Migraine with aura     auras  . Thyroid disease   . Stroke [facility])     on hrt double vision- possible blood clot on brain; Dr. Lesia Sago at [facility]   . Pneumonia 2007    and flu hospitalized   . Hx of varicella   . Other and unspecified hyperlipidemia 04/27/2012    Has been on meds for a number of years .  Low dose crestor .  Baseline reported as 280/   . Non-cardiac chest pain     hops and evaluation 2011 dr Riley Kill  . CVA (cerebral infarction) 2010    diplopia neg acute MRi whit matter changes neg LP   . Abnormal finding on MRI of brain 01/10/2015  . PFO (patent foramen ovale)     Moderate size    Past Surgical History  Procedure Laterality Date  . Tonsillectomy    . Foot surgery      had bunyon removed  . Breast surgery      implants  . Colonoscopy      Family History  Problem Relation Age of Onset  . Adopted: Yes  . Breast cancer Maternal Aunt   . Cancer Mother     pancreatic  . Alcohol abuse Mother     Social History   Social History  . Marital Status: Married    Spouse Name: N/A  . Number of Children: 1  . Years of Education: N/A   Social History Main Topics  . Smoking status: Never Smoker   . Smokeless tobacco: Never Used  . Alcohol Use: No  . Drug Use: No  . Sexual Activity: Yes   Other Topics Concern  . 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    Neg tad   Some caffiene    Seatbelts no ets fa stored    G1P1   Physically active     5 yo grandaughter    Patient is left handed.           Current outpatient prescriptions:  .  aspirin 81 MG tablet, Take 81 mg by mouth daily., Disp: , Rfl:  .  atorvastatin (LIPITOR) 20 MG tablet, Take 1 tablet (20 mg total) by mouth daily., Disp: 90 tablet, Rfl: 3 .  hydrochlorothiazide (HYDRODIURIL) 25 MG tablet, Take 1 tablet (25 mg total) by mouth daily., Disp: 90 tablet, Rfl: 3 .  levothyroxine (SYNTHROID, LEVOTHROID) 88 MCG tablet, Take 1 tablet (88 mcg total) by mouth daily., Disp: 90 tablet, Rfl: 3 .  potassium chloride (K-DUR,KLOR-CON) 10 MEQ tablet, Take 2 tablets (20 mEq total) by mouth daily., Disp: 180 tablet, Rfl: 1  EXAM:  Filed Vitals:   10/04/15 1111  BP: 102/84  Pulse: 94  Temp: 98.3 F (36.8 C)    Body mass index is 22.29 kg/(m^2).  GENERAL: vitals reviewed and listed above, alert, oriented, appears well hydrated and in no acute distress  HEENT: atraumatic, conjunttiva clear, no obvious abnormalities on inspection of external nose and ears, normal appearance of ear canals and TMs, clear nasal congestion, mild post oropharyngeal erythema with PND, no tonsillar edema or exudate, no sinus TTP  NECK: no obvious masses on inspection  LUNGS: clear to auscultation bilaterally, no wheezes, rales or rhonchi, good air movement  CV: HRRR, no peripheral edema  MS: moves all extremities without noticeable abnormality  PSYCH: pleasant and cooperative, no obvious depression or anxiety  ASSESSMENT AND PLAN:  Discussed the following assessment and plan:  Acute upper respiratory infection  -given HPI and exam findings today, a serious infection or illness is unlikely. We discussed potential etiologies, with VURI being most likely, and advised supportive care and monitoring. We discussed treatment side effects, likely  course, antibiotic misuse, transmission, and signs of developing a serious illness. -of course, we advised to return or notify a doctor immediately if symptoms worsen or persist or new concerns arise.    Patient Instructions  INSTRUCTIONS FOR UPPER RESPIRATORY INFECTION:  -plenty of rest and fluids  -nasal saline wash 2-3 times daily (use prepackaged nasal saline or bottled/distilled water if making your own)   -can use AFRIN nasal spray for drainage and nasal congestion - but do NOT use longer then 3-4 days  -can use tylenol (in no history of liver disease) or ibuprofen (if no history of kidney disease, bowel bleeding or significant heart disease) as directed for aches and sorethroat  -in the winter time, using a humidifier at night is helpful (please follow cleaning instructions)  -if you are taking a cough medication - use only as directed, may also try a teaspoon of honey to coat the throat and throat lozenges. If given a cough medication with codeine or hydrocodone or other narcotic please be advised that this contains a strong and  potentially addicting medication. Please follow instructions carefully, take as little as possible and only use AS NEEDED for severe cough. Discuss potential side effects with your pharmacy. Please do not drive or operate machinery while taking these types of medications. Please do not take other sedating medications, drugs or alcohol while taking this medication without discussing with your doctor.  -for sore throat, salt water gargles can help  -follow up if you have fevers, facial pain, tooth pain, difficulty breathing or are worsening or symptoms persist longer then expected  Upper Respiratory Infection, Adult An upper respiratory infection (URI) is also known as the common cold. It is often caused by a type of germ (virus). Colds are easily spread (contagious). You can pass it to others by kissing, coughing, sneezing, or drinking out of the same glass.  Usually, you get better in 1 to 3  weeks.  However, the cough can last for even longer. HOME CARE   Only take medicine as told by your doctor. Follow instructions provided above.  Drink enough water and fluids to keep your pee (urine) clear or pale yellow.  Get plenty of rest.  Return to work when your temperature is < 100 for 24 hours or as told by your doctor. You may use a face mask and wash your hands to stop your cold from spreading. GET HELP RIGHT AWAY IF:   After the first few days, you feel you are getting worse.  You have questions about your medicine.  You have chills, shortness of breath, or red spit (mucus).  You have pain in the face for more then 1-2 days, especially when you bend forward.  You have a fever, puffy (swollen) neck, pain when you swallow, or white spots in the back of your throat.  You have a bad headache, ear pain, sinus pain, or chest pain.  You have a high-pitched whistling sound when you breathe in and out (wheezing).  You cough up blood.  You have sore muscles or a stiff neck. MAKE SURE YOU:   Understand these instructions.  Will watch your condition.  Will get help right away if you are not doing well or get worse. Document Released: 04/29/2008 Document Revised: 02/03/2012 Document Reviewed: 02/16/2014 [facility] Patient Information 2015 [place], [place]. This information is not intended to replace advice given to you by your health care provider. Make sure you discuss any questions you have with your health care provider.      Kriste BasqueKIM, HANNAH R.

## 2015-10-04 NOTE — Progress Notes (Signed)
Pre visit review using our clinic review tool, if applicable. No additional management support is needed unless otherwise documented below in the visit note. 

## 2015-10-04 NOTE — Patient Instructions (Signed)

## 2016-02-01 ENCOUNTER — Other Ambulatory Visit: Payer: Self-pay | Admitting: Internal Medicine

## 2016-02-02 ENCOUNTER — Other Ambulatory Visit: Payer: Self-pay | Admitting: Family Medicine

## 2016-02-02 ENCOUNTER — Telehealth: Payer: Self-pay | Admitting: Family Medicine

## 2016-02-02 DIAGNOSIS — E039 Hypothyroidism, unspecified: Secondary | ICD-10-CM

## 2016-02-02 DIAGNOSIS — E785 Hyperlipidemia, unspecified: Secondary | ICD-10-CM

## 2016-02-02 DIAGNOSIS — I10 Essential (primary) hypertension: Secondary | ICD-10-CM

## 2016-02-02 NOTE — Telephone Encounter (Signed)
Due for med check  And lab    Bmp for this med   tsh for thyroid etc  Please arrange   For px with labs OR  OV with bmplipid  tsh pre visit   Can refill x 2 months until can get lab and ov  Lab Results  Component Value Date   WBC 7.4 11/16/2014   HGB 13.2 11/16/2014   HCT 40.2 11/16/2014   PLT 219.0 11/16/2014   GLUCOSE 87 11/16/2014   CHOL 175 11/16/2014   TRIG 91.0 11/16/2014   HDL 66.20 11/16/2014   LDLCALC 91 11/16/2014   ALT 33 11/23/2014   AST 33 11/23/2014   NA 139 11/16/2014   K 4.8 11/16/2014   CL 105 11/16/2014   CREATININE 0.9 11/16/2014   BUN 12 11/16/2014   CO2 27 11/16/2014   TSH 0.39 02/07/2015   INR 0.97 03/01/2010

## 2016-02-02 NOTE — Telephone Encounter (Signed)
Pt needs lab work and CPX or 30 minute follow up appointment in the next 2 months.  I have placed the lab orders.  Please help her to make appointments.  Thanks!

## 2016-02-02 NOTE — Telephone Encounter (Signed)
lmom for pt to call back

## 2016-02-02 NOTE — Telephone Encounter (Signed)
Sent to the pharmacy by e-scribe. 

## 2016-02-06 NOTE — Telephone Encounter (Signed)
Pt has been sch

## 2016-02-21 ENCOUNTER — Other Ambulatory Visit (INDEPENDENT_AMBULATORY_CARE_PROVIDER_SITE_OTHER): Payer: Managed Care, Other (non HMO)

## 2016-02-21 DIAGNOSIS — E039 Hypothyroidism, unspecified: Secondary | ICD-10-CM

## 2016-02-21 DIAGNOSIS — E785 Hyperlipidemia, unspecified: Secondary | ICD-10-CM

## 2016-02-21 DIAGNOSIS — I10 Essential (primary) hypertension: Secondary | ICD-10-CM

## 2016-02-21 DIAGNOSIS — Z0000 Encounter for general adult medical examination without abnormal findings: Secondary | ICD-10-CM

## 2016-02-21 LAB — HEPATIC FUNCTION PANEL
ALBUMIN: 4.3 g/dL (ref 3.5–5.2)
ALT: 25 U/L (ref 0–35)
AST: 26 U/L (ref 0–37)
Alkaline Phosphatase: 151 U/L — ABNORMAL HIGH (ref 39–117)
Bilirubin, Direct: 0.1 mg/dL (ref 0.0–0.3)
TOTAL PROTEIN: 7.4 g/dL (ref 6.0–8.3)
Total Bilirubin: 0.5 mg/dL (ref 0.2–1.2)

## 2016-02-21 LAB — LIPID PANEL
CHOLESTEROL: 191 mg/dL (ref 0–200)
HDL: 84.2 mg/dL (ref >39.00)
LDL Cholesterol: 90 mg/dL (ref 0–99)
NonHDL: 106.86
TRIGLYCERIDES: 84 mg/dL (ref 0.0–149.0)
Total CHOL/HDL Ratio: 2
VLDL: 16.8 mg/dL (ref 0.0–40.0)

## 2016-02-21 LAB — CBC WITH DIFFERENTIAL/PLATELET
Basophils Absolute: 0.1 10*3/uL (ref 0.0–0.1)
Basophils Relative: 1 % (ref 0.0–3.0)
EOS PCT: 5.2 % — AB (ref 0.0–5.0)
Eosinophils Absolute: 0.3 10*3/uL (ref 0.0–0.7)
HEMATOCRIT: 40.3 % (ref 36.0–46.0)
Hemoglobin: 13.6 g/dL (ref 12.0–15.0)
LYMPHS ABS: 1.1 10*3/uL (ref 0.7–4.0)
Lymphocytes Relative: 17.8 % (ref 12.0–46.0)
MCHC: 33.9 g/dL (ref 30.0–36.0)
MCV: 89 fl (ref 78.0–100.0)
MONOS PCT: 10.4 % (ref 3.0–12.0)
Monocytes Absolute: 0.6 10*3/uL (ref 0.1–1.0)
NEUTROS ABS: 3.9 10*3/uL (ref 1.4–7.7)
NEUTROS PCT: 65.6 % (ref 43.0–77.0)
Platelets: 238 10*3/uL (ref 150.0–400.0)
RBC: 4.52 Mil/uL (ref 3.87–5.11)
RDW: 13.1 % (ref 11.5–15.5)
WBC: 6 10*3/uL (ref 4.0–10.5)

## 2016-02-21 LAB — BASIC METABOLIC PANEL
BUN: 15 mg/dL (ref 6–23)
CALCIUM: 10.4 mg/dL (ref 8.4–10.5)
CO2: 28 meq/L (ref 19–32)
CREATININE: 0.89 mg/dL (ref 0.40–1.20)
Chloride: 102 mEq/L (ref 96–112)
GFR: 68.43 mL/min (ref >60.00)
Glucose, Bld: 87 mg/dL (ref 70–99)
Potassium: 4.4 mEq/L (ref 3.5–5.1)
SODIUM: 138 meq/L (ref 135–145)

## 2016-02-21 LAB — TSH: TSH: 0.24 u[IU]/mL — AB (ref 0.35–4.50)

## 2016-02-27 NOTE — Progress Notes (Signed)
Chief Complaint  Patient presents with  . Annual Exam    Patient is here for her annual physical. Patient c/o pain and bleeding during intercourse. This has been going on for a year. Patient also mentions leg/foot cramping at night. Patient lastly mentions chest pain that she feels may be gas for the last four months.     HPI: Patient  Dana [name]  60 y.o. comes in today for Preventive Health Care visit   Couple of issues  See above   Thyroid  Same med generic  nochange  Leg cframps  Bothering her even on potassium supplement  Also expensive   Over due for pap and  Has had  dyspareunia and  Bleeding when wipe etc     No other bleeding? Saw dr Ronita Hipps in the past.   utd on breast mammo decline zostavax   On prn fu neuro no problem active   Friend died of pancreatic cancer /s  Saw dr Katy Fitch has cyst upper eye lid   rx may need procedure  Health Maintenance  Topic Date Due  . HIV Screening  08/31/1969  . TETANUS/TDAP  08/31/1973  . ZOSTAVAX  08/31/2014  . PAP SMEAR  04/21/2016  . INFLUENZA VACCINE  06/25/2016  . MAMMOGRAM  05/03/2017  . COLONOSCOPY  09/29/2023  . Hepatitis C Screening  Completed   Health Maintenance Review LIFESTYLE:  Exercise:  house cleaning  And filing  Tobacco/ETS:n Alcohol: n Sugar beverages: Sleep: 5-6  Drug use: no  ROS:  GEN/ HEENT: No fever, significant weight changes sweats headaches vision problems hearing changes, CV/ PULM; No chest pain shortness of breath cough, syncope,edema  change in exercise tolerance. GI /GU: No adominal pain, vomiting, change in bowel habits. No blood in the stool. SKIN/HEME: ,no acute skin rashes suspicious lesions or bleeding. No lymphadenopathy, nodules, masses.  NEURO/ PSYCH:  No neurologic signs such as weakness numbness. No depression anxiety. IMM/ Allergy: No unusual infections.  Allergy .   REST of 12 system review negative except as per HPI   Past Medical History  Diagnosis Date  . High blood  pressure     readings  . High cholesterol   . Migraine with aura     auras  . Thyroid disease   . Stroke [facility])     on hrt double vision- possible blood clot on brain; Dr. Floyde Parkins at [facility]   . Pneumonia 2007    and flu hospitalized   . Hx of varicella   . Other and unspecified hyperlipidemia 04/27/2012    Has been on meds for a number of years .  Low dose crestor .  Baseline reported as 280/   . Non-cardiac chest pain     hops and evaluation 2011 dr Lia Foyer  . CVA (cerebral infarction) 2010    diplopia neg acute MRi whit matter changes neg LP   . Abnormal finding on MRI of brain 01/10/2015  . PFO (patent foramen ovale)     Moderate size    Past Surgical History  Procedure Laterality Date  . Tonsillectomy    . Foot surgery      had bunyon removed  . Breast surgery      implants  . Colonoscopy      Family History  Problem Relation Age of Onset  . Adopted: Yes  . Breast cancer Maternal Aunt   . Cancer Mother     pancreatic  . Alcohol abuse Mother  Social History   Social History  . Marital Status: Married    Spouse Name: N/A  . Number of Children: 1  . Years of Education: N/A   Social History Main Topics  . Smoking status: Never Smoker   . Smokeless tobacco: Never Used  . Alcohol Use: No  . Drug Use: No  . Sexual Activity: Yes   Other Topics Concern  . 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    Neg tad   Some caffiene    Seatbelts no ets fa stored    G1P1   Physically active     52 yo grandaughter    Patient is left handed.          Outpatient Prescriptions Prior to Visit  Medication Sig Dispense Refill  . aspirin 81 MG tablet Take 81 mg by mouth daily.    Marland Kitchen atorvastatin (LIPITOR) 20 MG tablet Take 1 tablet (20 mg total) by mouth  daily. 90 tablet 3  . hydrochlorothiazide (HYDRODIURIL) 25 MG tablet TAKE 1 TABLET (25 MG TOTAL) BY MOUTH DAILY. 60 tablet 0  . levothyroxine (SYNTHROID, LEVOTHROID) 88 MCG tablet Take 1 tablet (88 mcg total) by mouth daily. 90 tablet 3  . potassium chloride (K-DUR,KLOR-CON) 10 MEQ tablet Take 2 tablets (20 mEq total) by mouth daily. 180 tablet 1   No facility-administered medications prior to visit.     EXAM:  BP 110/68 mmHg  Pulse 79  Ht _0  (1.626 m)  Wt 133 lb 2 oz (60.385 kg)  BMI 22.84 kg/m2  SpO2 97%  LMP 04/25/2009  Body mass index is 22.84 kg/(m^2).  Physical Exam: Vital signs reviewed UXL:KGMW is a well-developed well-nourished alert cooperative    who appearsr stated age in no acute distress.  HEENT: normocephalic atraumatic , Eyes: PERRL EOM's full, conjunctiva clear small cyst upper eye lid right , Nares: paten,t no deformity discharge or tenderness., Ears: no deformity EAC's clear TMs with normal landmarks. Mouth: clear OP, no lesions, edema.  Moist mucous membranes. Dentition in adequate repair. NECK: supple without masses, thyromegaly or bruits. CHEST/PULM:  Clear to auscultation and percussion breath sounds equal no wheeze , rales or rhonchi. No chest wall deformities or tenderness. CV: PMI is nondisplaced, S1 S2 no gallops, murmurs, rubs. Peripheral pulses are full without delay.No JVD . Breast  Implants no  Masses  Dc  ABDOMEN: Bowel sounds normal nontender  No guard or rebound, no hepato splenomegal no CVA tenderness.   Extremtities:  No clubbing cyanosis or edema, no acute joint swelling or redness no focal atrophy NEURO:  Oriented x3, cranial nerves 3-12 appear to be intact, no obvious focal weakness,gait within normal limits no abnormal reflexes or asymmetrical SKIN: No acute rashes normal turgor, color, no bruising or petechiae. PSYCH: Oriented, good eye contact, no obvious depression anxiety, cognition and judgment appear normal. LN: no cervical axillary  inguinal adenopathy  Lab Results  Component Value Date   WBC 6.0 02/21/2016   HGB 13.6 02/21/2016   HCT 40.3 02/21/2016   PLT 238.0 02/21/2016   GLUCOSE 87 02/21/2016   CHOL 191 02/21/2016   TRIG 84.0 02/21/2016   HDL 84.20 02/21/2016   LDLCALC 90 02/21/2016  ALT 25 02/21/2016   AST 26 02/21/2016   NA 138 02/21/2016   K 4.4 02/21/2016   CL 102 02/21/2016   CREATININE 0.89 02/21/2016   BUN 15 02/21/2016   CO2 28 02/21/2016   TSH 0.24* 02/21/2016   INR 0.97 03/01/2010    ASSESSMENT AND PLAN:  Discussed the following assessment and plan:  Visit for preventive health examination  Hypothyroidism, unspecified hypothyroidism type - ? over suppressed ?  Hyperlipidemia  Essential hypertension  Cramps of lower extremity, unspecified laterality  Medication management  Dyspareunia, female - Plan: Ambulatory referral to Gynecology  Vagina bleeding - prob from vaginal atrophy   but need gyne referal opinon - Plan: Ambulatory referral to Gynecology  Alkaline phosphatase elevation  Patient Care Team: Burnis Medin, MD as PCP - General (Internal Medicine) Hillary Bow, MD (Cardiology) Brien Few, MD as Attending Physician (Obstetrics and Gynecology) Kathrynn Ducking, MD as Consulting Physician (Neurology) Patient Instructions  Your sx could be  From lack of estrogen in tissues of vagina but you need a gyne exam pap and evaluation . Will do a referral to dr Ronita Hipps  Try off the hctz   And potassium     ROV in 6 weeks to see if leg cramps and bp is ok   If bp is up we  May chose a different bp medication to use if cramps are better.  Will review  your record and plan labs  Before next visit to include repeat tsh  And  Vit d etc .    Health Maintenance, Female Adopting a healthy lifestyle and getting preventive care can go a long way to promote health and wellness. Talk with your health care provider about what schedule of regular examinations is right for you.  This is a good chance for you to check in with your provider about disease prevention and staying healthy. In between checkups, there are plenty of things you can do on your own. Experts have done a lot of research about which lifestyle changes and preventive measures are most likely to keep you healthy. Ask your health care provider for more information. WEIGHT AND DIET  Eat a healthy diet  Be sure to include plenty of vegetables, fruits, low-fat dairy products, and lean protein.  Do not eat a lot of foods high in solid fats, added sugars, or salt.  Get regular exercise. This is one of the most important things you can do for your health.  Most adults should exercise for at least 150 minutes each week. The exercise should increase your heart rate and make you sweat (moderate-intensity exercise).  Most adults should also do strengthening exercises at least twice a week. This is in addition to the moderate-intensity exercise.  Maintain a healthy weight  Body mass index (BMI) is a measurement that can be used to identify possible weight problems. It estimates body fat based on height and weight. Your health care provider can help determine your BMI and help you achieve or maintain a healthy weight.  For females 22 years of age and older:   A BMI below 18.5 is considered underweight.  A BMI of 18.5 to 24.9 is normal.  A BMI of 25 to 29.9 is considered overweight.  A BMI of 30 and above is considered obese.  Watch levels of cholesterol and blood lipids  You should start having your blood tested for lipids and cholesterol at 62 years of age, then have this test every 5 years.  You may need to have your cholesterol levels checked more often if:  Your lipid or cholesterol levels are high.  You are older than 62 years of age.  You are at high risk for heart disease.  CANCER SCREENING   Lung Cancer  Lung cancer screening is recommended for adults 89-36 years old who are at high  risk for lung cancer because of a history of smoking.  A yearly low-dose CT scan of the lungs is recommended for people who:  Currently smoke.  Have quit within the past 15 years.  Have at least a 30-pack-year history of smoking. A pack year is smoking an average of one pack of cigarettes a day for 1 year.  Yearly screening should continue until it has been 15 years since you quit.  Yearly screening should stop if you develop a health problem that would prevent you from having lung cancer treatment.  Breast Cancer  Practice breast self-awareness. This means understanding how your breasts normally appear and feel.  It also means doing regular breast self-exams. Let your health care provider know about any changes, no matter how small.  If you are in your 20s or 30s, you should have a clinical breast exam (CBE) by a health care provider every 1-3 years as part of a regular health exam.  If you are 81 or older, have a CBE every year. Also consider having a breast X-ray (mammogram) every year.  If you have a family history of breast cancer, talk to your health care provider about genetic screening.  If you are at high risk for breast cancer, talk to your health care provider about having an MRI and a mammogram every year.  Breast cancer gene (BRCA) assessment is recommended for women who have family members with BRCA-related cancers. BRCA-related cancers include:  Breast.  Ovarian.  Tubal.  Peritoneal cancers.  Results of the assessment will determine the need for genetic counseling and BRCA1 and BRCA2 testing. Cervical Cancer Your health care provider may recommend that you be screened regularly for cancer of the pelvic organs (ovaries, uterus, and vagina). This screening involves a pelvic examination, including checking for microscopic changes to the surface of your cervix (Pap test). You may be encouraged to have this screening done every 3 years, beginning at age 51.  For  women ages 8-65, health care providers may recommend pelvic exams and Pap testing every 3 years, or they may recommend the Pap and pelvic exam, combined with testing for human papilloma virus (HPV), every 5 years. Some types of HPV increase your risk of cervical cancer. Testing for HPV may also be done on women of any age with unclear Pap test results.  Other health care providers may not recommend any screening for nonpregnant women who are considered low risk for pelvic cancer and who do not have symptoms. Ask your health care provider if a screening pelvic exam is right for you.  If you have had past treatment for cervical cancer or a condition that could lead to cancer, you need Pap tests and screening for cancer for at least 20 years after your treatment. If Pap tests have been discontinued, your risk factors (such as having a new sexual partner) need to be reassessed to determine if screening should resume. Some women have medical problems that increase the chance of getting cervical cancer. In these cases, your health care provider may recommend more frequent screening and Pap tests. Colorectal Cancer  This type of cancer can be detected  and often prevented.  Routine colorectal cancer screening usually begins at 62 years of age and continues through 62 years of age.  Your health care provider may recommend screening at an earlier age if you have risk factors for colon cancer.  Your health care provider may also recommend using home test kits to check for hidden blood in the stool.  A small camera at the end of a tube can be used to examine your colon directly (sigmoidoscopy or colonoscopy). This is done to check for the earliest forms of colorectal cancer.  Routine screening usually begins at age 22.  Direct examination of the colon should be repeated every 5-10 years through 62 years of age. However, you may need to be screened more often if early forms of precancerous polyps or small  growths are found. Skin Cancer  Check your skin from head to toe regularly.  Tell your health care provider about any new moles or changes in moles, especially if there is a change in a mole's shape or color.  Also tell your health care provider if you have a mole that is larger than the size of a pencil eraser.  Always use sunscreen. Apply sunscreen liberally and repeatedly throughout the day.  Protect yourself by wearing long sleeves, pants, a wide-brimmed hat, and sunglasses whenever you are outside. HEART DISEASE, DIABETES, AND HIGH BLOOD PRESSURE   High blood pressure causes heart disease and increases the risk of stroke. High blood pressure is more likely to develop in:  People who have blood pressure in the high end of the normal range (130-139/85-89 mm Hg).  People who are overweight or obese.  People who are African American.  If you are 59-24 years of age, have your blood pressure checked every 3-5 years. If you are 107 years of age or older, have your blood pressure checked every year. You should have your blood pressure measured twice--once when you are at a hospital or clinic, and once when you are not at a hospital or clinic. Record the average of the two measurements. To check your blood pressure when you are not at a hospital or clinic, you can use:  An automated blood pressure machine at a pharmacy.  A home blood pressure monitor.  If you are between 59 years and 28 years old, ask your health care provider if you should take aspirin to prevent strokes.  Have regular diabetes screenings. This involves taking a blood sample to check your fasting blood sugar level.  If you are at a normal weight and have a low risk for diabetes, have this test once every three years after 62 years of age.  If you are overweight and have a high risk for diabetes, consider being tested at a younger age or more often. PREVENTING INFECTION  Hepatitis B  If you have a higher risk for  hepatitis B, you should be screened for this virus. You are considered at high risk for hepatitis B if:  You were born in a country where hepatitis B is common. Ask your health care provider which countries are considered high risk.  Your parents were born in a high-risk country, and you have not been immunized against hepatitis B (hepatitis B vaccine).  You have HIV or AIDS.  You use needles to inject street drugs.  You live with someone who has hepatitis B.  You have had sex with someone who has hepatitis B.  You get hemodialysis treatment.  You take certain medicines for  conditions, including cancer, organ transplantation, and autoimmune conditions. Hepatitis C  Blood testing is recommended for:  Everyone born from [birth year] through 1967.  Anyone with known risk factors for hepatitis C. Sexually transmitted infections (STIs)  You should be screened for sexually transmitted infections (STIs) including gonorrhea and chlamydia if:  You are sexually active and are younger than 62 years of age.  You are older than 62 years of age and your health care provider tells you that you are at risk for this type of infection.  Your sexual activity has changed since you were last screened and you are at an increased risk for chlamydia or gonorrhea. Ask your health care provider if you are at risk.  If you do not have HIV, but are at risk, it may be recommended that you take a prescription medicine daily to prevent HIV infection. This is called pre-exposure prophylaxis (PrEP). You are considered at risk if:  You are sexually active and do not regularly use condoms or know the HIV status of your partner(s).  You take drugs by injection.  You are sexually active with a partner who has HIV. Talk with your health care provider about whether you are at high risk of being infected with HIV. If you choose to begin PrEP, you should first be tested for HIV. You should then be tested every 3 months for  as long as you are taking PrEP.  PREGNANCY   If you are premenopausal and you may become pregnant, ask your health care provider about preconception counseling.  If you may become pregnant, take 400 to 800 micrograms (mcg) of folic acid every day.  If you want to prevent pregnancy, talk to your health care provider about birth control (contraception). OSTEOPOROSIS AND MENOPAUSE   Osteoporosis is a disease in which the bones lose minerals and strength with aging. This can result in serious bone fractures. Your risk for osteoporosis can be identified using a bone density scan.  If you are 4 years of age or older, or if you are at risk for osteoporosis and fractures, ask your health care provider if you should be screened.  Ask your health care provider whether you should take a calcium or vitamin D supplement to lower your risk for osteoporosis.  Menopause may have certain physical symptoms and risks.  Hormone replacement therapy may reduce some of these symptoms and risks. Talk to your health care provider about whether hormone replacement therapy is right for you.  HOME CARE INSTRUCTIONS   Schedule regular health, dental, and eye exams.  Stay current with your immunizations.   Do not use any tobacco products including cigarettes, chewing tobacco, or electronic cigarettes.  If you are pregnant, do not drink alcohol.  If you are breastfeeding, limit how much and how often you drink alcohol.  Limit alcohol intake to no more than 1 drink per day for nonpregnant women. One drink equals 12 ounces of beer, 5 ounces of wine, or 1 ounces of hard liquor.  Do not use street drugs.  Do not share needles.  Ask your health care provider for help if you need support or information about quitting drugs.  Tell your health care provider if you often feel depressed.  Tell your health care provider if you have ever been abused or do not feel safe at home.   This information is not intended  to replace advice given to you by your health care provider. Make sure you discuss any questions you have with  your health care provider.   Document Released: 05/27/2011 Document Revised: 12/02/2014 Document Reviewed: 10/13/2013 Elsevier Interactive Patient Education 2016 Zena K. Logyn Kendrick M.D.

## 2016-02-28 ENCOUNTER — Encounter: Payer: Self-pay | Admitting: Internal Medicine

## 2016-02-28 ENCOUNTER — Ambulatory Visit (INDEPENDENT_AMBULATORY_CARE_PROVIDER_SITE_OTHER): Payer: Managed Care, Other (non HMO) | Admitting: Internal Medicine

## 2016-02-28 VITALS — BP 110/68 | HR 79 | Ht 64.0 in | Wt 133.1 lb

## 2016-02-28 DIAGNOSIS — Z0000 Encounter for general adult medical examination without abnormal findings: Secondary | ICD-10-CM | POA: Diagnosis not present

## 2016-02-28 DIAGNOSIS — R252 Cramp and spasm: Secondary | ICD-10-CM | POA: Diagnosis not present

## 2016-02-28 DIAGNOSIS — E785 Hyperlipidemia, unspecified: Secondary | ICD-10-CM

## 2016-02-28 DIAGNOSIS — I10 Essential (primary) hypertension: Secondary | ICD-10-CM

## 2016-02-28 DIAGNOSIS — N939 Abnormal uterine and vaginal bleeding, unspecified: Secondary | ICD-10-CM

## 2016-02-28 DIAGNOSIS — Z79899 Other long term (current) drug therapy: Secondary | ICD-10-CM

## 2016-02-28 DIAGNOSIS — R748 Abnormal levels of other serum enzymes: Secondary | ICD-10-CM

## 2016-02-28 DIAGNOSIS — N941 Unspecified dyspareunia: Secondary | ICD-10-CM

## 2016-02-28 DIAGNOSIS — E039 Hypothyroidism, unspecified: Secondary | ICD-10-CM

## 2016-02-28 NOTE — Patient Instructions (Addendum)
Your sx could be  From lack of estrogen in tissues of vagina but you need a gyne exam pap and evaluation . Will do a referral to dr Ronita Hipps  Try off the hctz   And potassium     ROV in 6 weeks to see if leg cramps and bp is ok   If bp is up we  May chose a different bp medication to use if cramps are better.  Will review  your record and plan labs  Before next visit to include repeat tsh  And  Vit d etc .    Health Maintenance, Female Adopting a healthy lifestyle and getting preventive care can go a long way to promote health and wellness. Talk with your health care provider about what schedule of regular examinations is right for you. This is a good chance for you to check in with your provider about disease prevention and staying healthy. In between checkups, there are plenty of things you can do on your own. Experts have done a lot of research about which lifestyle changes and preventive measures are most likely to keep you healthy. Ask your health care provider for more information. WEIGHT AND DIET  Eat a healthy diet  Be sure to include plenty of vegetables, fruits, low-fat dairy products, and lean protein.  Do not eat a lot of foods high in solid fats, added sugars, or salt.  Get regular exercise. This is one of the most important things you can do for your health.  Most adults should exercise for at least 150 minutes each week. The exercise should increase your heart rate and make you sweat (moderate-intensity exercise).  Most adults should also do strengthening exercises at least twice a week. This is in addition to the moderate-intensity exercise.  Maintain a healthy weight  Body mass index (BMI) is a measurement that can be used to identify possible weight problems. It estimates body fat based on height and weight. Your health care provider can help determine your BMI and help you achieve or maintain a healthy weight.  For females 86 years of age and older:   A BMI below 18.5  is considered underweight.  A BMI of 18.5 to 24.9 is normal.  A BMI of 25 to 29.9 is considered overweight.  A BMI of 30 and above is considered obese.  Watch levels of cholesterol and blood lipids  You should start having your blood tested for lipids and cholesterol at 62 years of age, then have this test every 5 years.  You may need to have your cholesterol levels checked more often if:  Your lipid or cholesterol levels are high.  You are older than 61 years of age.  You are at high risk for heart disease.  CANCER SCREENING   Lung Cancer  Lung cancer screening is recommended for adults 38-65 years old who are at high risk for lung cancer because of a history of smoking.  A yearly low-dose CT scan of the lungs is recommended for people who:  Currently smoke.  Have quit within the past 15 years.  Have at least a 30-pack-year history of smoking. A pack year is smoking an average of one pack of cigarettes a day for 1 year.  Yearly screening should continue until it has been 15 years since you quit.  Yearly screening should stop if you develop a health problem that would prevent you from having lung cancer treatment.  Breast Cancer  Practice breast self-awareness. This means understanding  how your breasts normally appear and feel.  It also means doing regular breast self-exams. Let your health care provider know about any changes, no matter how small.  If you are in your 20s or 30s, you should have a clinical breast exam (CBE) by a health care provider every 1-3 years as part of a regular health exam.  If you are 17 or older, have a CBE every year. Also consider having a breast X-ray (mammogram) every year.  If you have a family history of breast cancer, talk to your health care provider about genetic screening.  If you are at high risk for breast cancer, talk to your health care provider about having an MRI and a mammogram every year.  Breast cancer gene (BRCA)  assessment is recommended for women who have family members with BRCA-related cancers. BRCA-related cancers include:  Breast.  Ovarian.  Tubal.  Peritoneal cancers.  Results of the assessment will determine the need for genetic counseling and BRCA1 and BRCA2 testing. Cervical Cancer Your health care provider may recommend that you be screened regularly for cancer of the pelvic organs (ovaries, uterus, and vagina). This screening involves a pelvic examination, including checking for microscopic changes to the surface of your cervix (Pap test). You may be encouraged to have this screening done every 3 years, beginning at age 81.  For women ages 4-65, health care providers may recommend pelvic exams and Pap testing every 3 years, or they may recommend the Pap and pelvic exam, combined with testing for human papilloma virus (HPV), every 5 years. Some types of HPV increase your risk of cervical cancer. Testing for HPV may also be done on women of any age with unclear Pap test results.  Other health care providers may not recommend any screening for nonpregnant women who are considered low risk for pelvic cancer and who do not have symptoms. Ask your health care provider if a screening pelvic exam is right for you.  If you have had past treatment for cervical cancer or a condition that could lead to cancer, you need Pap tests and screening for cancer for at least 20 years after your treatment. If Pap tests have been discontinued, your risk factors (such as having a new sexual partner) need to be reassessed to determine if screening should resume. Some women have medical problems that increase the chance of getting cervical cancer. In these cases, your health care provider may recommend more frequent screening and Pap tests. Colorectal Cancer  This type of cancer can be detected and often prevented.  Routine colorectal cancer screening usually begins at 62 years of age and continues through 62 years  of age.  Your health care provider may recommend screening at an earlier age if you have risk factors for colon cancer.  Your health care provider may also recommend using home test kits to check for hidden blood in the stool.  A small camera at the end of a tube can be used to examine your colon directly (sigmoidoscopy or colonoscopy). This is done to check for the earliest forms of colorectal cancer.  Routine screening usually begins at age 52.  Direct examination of the colon should be repeated every 5-10 years through 62 years of age. However, you may need to be screened more often if early forms of precancerous polyps or small growths are found. Skin Cancer  Check your skin from head to toe regularly.  Tell your health care provider about any new moles or changes in moles,  especially if there is a change in a mole's shape or color.  Also tell your health care provider if you have a mole that is larger than the size of a pencil eraser.  Always use sunscreen. Apply sunscreen liberally and repeatedly throughout the day.  Protect yourself by wearing long sleeves, pants, a wide-brimmed hat, and sunglasses whenever you are outside. HEART DISEASE, DIABETES, AND HIGH BLOOD PRESSURE   High blood pressure causes heart disease and increases the risk of stroke. High blood pressure is more likely to develop in:  People who have blood pressure in the high end of the normal range (130-139/85-89 mm Hg).  People who are overweight or obese.  People who are African American.  If you are 10-38 years of age, have your blood pressure checked every 3-5 years. If you are 60 years of age or older, have your blood pressure checked every year. You should have your blood pressure measured twice--once when you are at a hospital or clinic, and once when you are not at a hospital or clinic. Record the average of the two measurements. To check your blood pressure when you are not at a hospital or clinic, you  can use:  An automated blood pressure machine at a pharmacy.  A home blood pressure monitor.  If you are between 82 years and 76 years old, ask your health care provider if you should take aspirin to prevent strokes.  Have regular diabetes screenings. This involves taking a blood sample to check your fasting blood sugar level.  If you are at a normal weight and have a low risk for diabetes, have this test once every three years after 62 years of age.  If you are overweight and have a high risk for diabetes, consider being tested at a younger age or more often. PREVENTING INFECTION  Hepatitis B  If you have a higher risk for hepatitis B, you should be screened for this virus. You are considered at high risk for hepatitis B if:  You were born in a country where hepatitis B is common. Ask your health care provider which countries are considered high risk.  Your parents were born in a high-risk country, and you have not been immunized against hepatitis B (hepatitis B vaccine).  You have HIV or AIDS.  You use needles to inject street drugs.  You live with someone who has hepatitis B.  You have had sex with someone who has hepatitis B.  You get hemodialysis treatment.  You take certain medicines for conditions, including cancer, organ transplantation, and autoimmune conditions. Hepatitis C  Blood testing is recommended for:  Everyone born from [birth year] through 1967.  Anyone with known risk factors for hepatitis C. Sexually transmitted infections (STIs)  You should be screened for sexually transmitted infections (STIs) including gonorrhea and chlamydia if:  You are sexually active and are younger than 62 years of age.  You are older than 62 years of age and your health care provider tells you that you are at risk for this type of infection.  Your sexual activity has changed since you were last screened and you are at an increased risk for chlamydia or gonorrhea. Ask your health  care provider if you are at risk.  If you do not have HIV, but are at risk, it may be recommended that you take a prescription medicine daily to prevent HIV infection. This is called pre-exposure prophylaxis (PrEP). You are considered at risk if:  You are sexually active  and do not regularly use condoms or know the HIV status of your partner(s).  You take drugs by injection.  You are sexually active with a partner who has HIV. Talk with your health care provider about whether you are at high risk of being infected with HIV. If you choose to begin PrEP, you should first be tested for HIV. You should then be tested every 3 months for as long as you are taking PrEP.  PREGNANCY   If you are premenopausal and you may become pregnant, ask your health care provider about preconception counseling.  If you may become pregnant, take 400 to 800 micrograms (mcg) of folic acid every day.  If you want to prevent pregnancy, talk to your health care provider about birth control (contraception). OSTEOPOROSIS AND MENOPAUSE   Osteoporosis is a disease in which the bones lose minerals and strength with aging. This can result in serious bone fractures. Your risk for osteoporosis can be identified using a bone density scan.  If you are 67 years of age or older, or if you are at risk for osteoporosis and fractures, ask your health care provider if you should be screened.  Ask your health care provider whether you should take a calcium or vitamin D supplement to lower your risk for osteoporosis.  Menopause may have certain physical symptoms and risks.  Hormone replacement therapy may reduce some of these symptoms and risks. Talk to your health care provider about whether hormone replacement therapy is right for you.  HOME CARE INSTRUCTIONS   Schedule regular health, dental, and eye exams.  Stay current with your immunizations.   Do not use any tobacco products including cigarettes, chewing tobacco, or  electronic cigarettes.  If you are pregnant, do not drink alcohol.  If you are breastfeeding, limit how much and how often you drink alcohol.  Limit alcohol intake to no more than 1 drink per day for nonpregnant women. One drink equals 12 ounces of beer, 5 ounces of wine, or 1 ounces of hard liquor.  Do not use street drugs.  Do not share needles.  Ask your health care provider for help if you need support or information about quitting drugs.  Tell your health care provider if you often feel depressed.  Tell your health care provider if you have ever been abused or do not feel safe at home.   This information is not intended to replace advice given to you by your health care provider. Make sure you discuss any questions you have with your health care provider.   Document Released: 05/27/2011 Document Revised: 12/02/2014 Document Reviewed: 10/13/2013 Elsevier Interactive Patient Education [employer].

## 2016-02-28 NOTE — Progress Notes (Signed)
Pre visit review using our clinic review tool, if applicable. No additional management support is needed unless otherwise documented below in the visit note. 

## 2016-02-28 NOTE — Addendum Note (Signed)
Addended by: Radford PaxAIRRIKIER DAVIDSON, Latise Dilley M on: 02/28/2016 03:21 PM   Modules accepted: Kipp BroodSmartSet

## 2016-03-03 ENCOUNTER — Other Ambulatory Visit: Payer: Self-pay | Admitting: Internal Medicine

## 2016-03-04 NOTE — Telephone Encounter (Signed)
Sent to the pharmacy by e-scribe. 

## 2016-03-14 ENCOUNTER — Other Ambulatory Visit: Payer: Self-pay | Admitting: Internal Medicine

## 2016-03-15 NOTE — Telephone Encounter (Signed)
Sent to the pharmacy by e-scribe. 

## 2016-04-03 ENCOUNTER — Other Ambulatory Visit (INDEPENDENT_AMBULATORY_CARE_PROVIDER_SITE_OTHER): Payer: Managed Care, Other (non HMO)

## 2016-04-03 DIAGNOSIS — I10 Essential (primary) hypertension: Secondary | ICD-10-CM

## 2016-04-03 DIAGNOSIS — E039 Hypothyroidism, unspecified: Secondary | ICD-10-CM | POA: Diagnosis not present

## 2016-04-03 DIAGNOSIS — R252 Cramp and spasm: Secondary | ICD-10-CM

## 2016-04-03 DIAGNOSIS — R748 Abnormal levels of other serum enzymes: Secondary | ICD-10-CM | POA: Diagnosis not present

## 2016-04-03 LAB — VITAMIN D 25 HYDROXY (VIT D DEFICIENCY, FRACTURES): VITD: 26.31 ng/mL — ABNORMAL LOW (ref 30.00–100.00)

## 2016-04-03 LAB — MAGNESIUM: Magnesium: 1.9 mg/dL (ref 1.5–2.5)

## 2016-04-03 LAB — CK: Total CK: 68 U/L (ref 7–177)

## 2016-04-03 LAB — TSH: TSH: 0.21 u[IU]/mL — ABNORMAL LOW (ref 0.35–4.50)

## 2016-04-04 LAB — PTH, INTACT AND CALCIUM
CALCIUM: 9.5 mg/dL (ref 8.4–10.5)
PTH: 30 pg/mL (ref 14–64)

## 2016-04-09 ENCOUNTER — Ambulatory Visit (INDEPENDENT_AMBULATORY_CARE_PROVIDER_SITE_OTHER): Payer: Managed Care, Other (non HMO) | Admitting: Internal Medicine

## 2016-04-09 ENCOUNTER — Encounter: Payer: Self-pay | Admitting: Internal Medicine

## 2016-04-09 VITALS — BP 132/80 | HR 73 | Temp 98.4°F | Ht 63.0 in | Wt 134.0 lb

## 2016-04-09 DIAGNOSIS — E039 Hypothyroidism, unspecified: Secondary | ICD-10-CM

## 2016-04-09 DIAGNOSIS — I10 Essential (primary) hypertension: Secondary | ICD-10-CM

## 2016-04-09 DIAGNOSIS — R252 Cramp and spasm: Secondary | ICD-10-CM

## 2016-04-09 DIAGNOSIS — R7989 Other specified abnormal findings of blood chemistry: Secondary | ICD-10-CM | POA: Diagnosis not present

## 2016-04-09 MED ORDER — LEVOTHYROXINE SODIUM 75 MCG PO TABS: 75.0000 ug | ORAL_TABLET | Freq: Every day | ORAL | Status: DC

## 2016-04-09 NOTE — Progress Notes (Signed)
Chief Complaint  Patient presents with  . Follow-up    HPI: Dana [name] 60 y.o.   Fu Dana [name]  comes in today for follow up of  multiple medical problems.    eye nodules got better  Per dr Katy Fitch  ocass headache     Saw gyne all ok  Can try topical est if needed  Not on vit d  Stopped hctz and potass and  Leg cramps have improved   Taking synthroid 88 per day   bp seem to be ok up a tad today  ROS: See pertinent positives and negatives per HPI. No cp sob feels ok today  Past Medical History  Diagnosis Date  . High blood pressure     readings  . High cholesterol   . Migraine with aura     auras  . Thyroid disease   . Stroke [facility])     on hrt double vision- possible blood clot on brain; Dr. Floyde Parkins at [facility]   . Pneumonia 2007    and flu hospitalized   . Hx of varicella   . Other and unspecified hyperlipidemia 04/27/2012    Has been on meds for a number of years .  Low dose crestor .  Baseline reported as 280/   . Non-cardiac chest pain     hops and evaluation 2011 dr Lia Foyer  . CVA (cerebral infarction) 2010    diplopia neg acute MRi whit matter changes neg LP   . Abnormal finding on MRI of brain 01/10/2015  . PFO (patent foramen ovale)     Moderate size    Family History  Problem Relation Age of Onset  . Adopted: Yes  . Breast cancer Maternal Aunt   . Cancer Mother     pancreatic  . Alcohol abuse Mother     Social History   Social History  . Marital Status: Married    Spouse Name: N/A  . Number of Children: 1  . Years of Education: N/A   Social History Main Topics  . Smoking status: Never Smoker   . Smokeless tobacco: Never Used  . Alcohol Use: No  . Drug Use: No  . Sexual Activity: Yes   Other Topics Concern  . 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    Neg tad   Some caffiene    Seatbelts no ets fa stored    G1P1   Physically active     10 yo grandaughter    Patient is left handed.          Outpatient Prescriptions Prior to Visit  Medication Sig Dispense Refill  . aspirin 81 MG tablet Take 81 mg by mouth daily.    Marland Kitchen atorvastatin (LIPITOR) 20 MG tablet TAKE 1 TABLET BY MOUTH EVERY DAY 90 tablet 2  . Omega-3 Fatty Acids (FISH OIL) 1000 MG CAPS Take 1,000 mg by mouth.    . levothyroxine (SYNTHROID, LEVOTHROID) 88 MCG tablet TAKE 1 TABLET (88 MCG TOTAL) BY MOUTH DAILY. 90 tablet 2  . hydrochlorothiazide (HYDRODIURIL) 25 MG tablet TAKE 1 TABLET (25 MG TOTAL) BY MOUTH DAILY. 60 tablet 0  . KLOR-CON 10 10 MEQ tablet TAKE  2 TABLETS (20 MEQ TOTAL) BY MOUTH DAILY. 180 tablet 0   No facility-administered medications prior to visit.     EXAM:  BP 132/80 mmHg  Pulse 73  Temp(Src) 98.4 F (36.9 C) (Oral)  Ht 5' 3"  (1.6 m)  Wt 134 lb (60.782 kg)  BMI 23.74 kg/m2  SpO2 98%  LMP 04/25/2009  Body mass index is 23.74 kg/(m^2).  GENERAL: vitals reviewed and listed above, alert, oriented, appears well hydrated and in no acute distress HEENT: atraumatic, conjunctiva  clear, no obvious abnormalities on inspection of external nose and ears NECK: no obvious masses on inspection palpation  LUNGS: clear to auscultation bilaterally, no wheezes, rales or rhonchi,  CV: HRRR, no clubbing cyanosis or  peripheral edema nl cap refill  MS: moves all extremities without noticeable focal  abnormality PSYCH: pleasant and cooperative, no obvious depression or anxiety Lab Results  Component Value Date   WBC 6.0 02/21/2016   HGB 13.6 02/21/2016   HCT 40.3 02/21/2016   PLT 238.0 02/21/2016   GLUCOSE 87 02/21/2016   CHOL 191 02/21/2016   TRIG 84.0 02/21/2016   HDL 84.20 02/21/2016   LDLCALC 90 02/21/2016   ALT 25 02/21/2016   AST 26 02/21/2016   NA 138 02/21/2016   K 4.4 02/21/2016   CL 102 02/21/2016   CREATININE 0.89  02/21/2016   BUN 15 02/21/2016   CO2 28 02/21/2016   TSH 0.21* 04/03/2016   INR 0.97 03/01/2010   BP Readings from Last 3 Encounters:  04/09/16 132/80  02/28/16 110/68  10/04/15 102/84     ASSESSMENT AND PLAN:  Discussed the following assessment and plan:  Essential hypertension  Cramps of lower extremity, unspecified laterality - impr going off diuretic  Hypothyroidism, unspecified hypothyroidism type  Low serum vitamin D  Take vit d - 1000 IUper day   Stay off diuretic  adjsut thyroid med  -Patient advised to return or notify health care team  if symptoms worsen ,persist or new concerns arise.  Patient Instructions  Decrease thryoid med dose to 75 per day  Add vit d 1000 per day  Stay off th diuretic and potassium for now  As long as your blood pressure is good .  PLan lab tests  To include the tsh and alk phos and vit d in about 2-3 months    If all ok can go back to yearly exam visits        Mariann Laster K. Panosh M.D.

## 2016-04-09 NOTE — Assessment & Plan Note (Signed)
Decrease to 75 mcg  tsh suppressed x 2  Check in 3 months

## 2016-04-09 NOTE — Patient Instructions (Addendum)
Decrease thryoid med dose to 75 per day  Add vit d 1000 per day  Stay off th diuretic and potassium for now  As long as your blood pressure is good .  PLan lab tests  To include the tsh and alk phos and vit d in about 2-3 months    If all ok can go back to yearly exam visits

## 2016-04-11 LAB — NUCLEOTIDASE, 5', BLOOD: 5 NUCLEOTIDASE: 18 U/L — AB (ref 0–10)

## 2016-05-10 LAB — HM MAMMOGRAPHY

## 2016-05-13 ENCOUNTER — Encounter: Payer: Self-pay | Admitting: Family Medicine

## 2016-05-15 ENCOUNTER — Other Ambulatory Visit: Payer: Self-pay | Admitting: Family Medicine

## 2016-05-15 DIAGNOSIS — E039 Hypothyroidism, unspecified: Secondary | ICD-10-CM

## 2016-05-15 DIAGNOSIS — R7989 Other specified abnormal findings of blood chemistry: Secondary | ICD-10-CM

## 2016-05-15 DIAGNOSIS — R748 Abnormal levels of other serum enzymes: Secondary | ICD-10-CM

## 2016-05-23 ENCOUNTER — Ambulatory Visit
Admission: RE | Admit: 2016-05-23 | Discharge: 2016-05-23 | Disposition: A | Discharge status: Home / Self Care | Payer: Managed Care, Other (non HMO) | Source: Ambulatory Visit | Attending: Internal Medicine | Admitting: Internal Medicine

## 2016-05-23 DIAGNOSIS — R748 Abnormal levels of other serum enzymes: Secondary | ICD-10-CM

## 2016-06-27 ENCOUNTER — Other Ambulatory Visit (INDEPENDENT_AMBULATORY_CARE_PROVIDER_SITE_OTHER): Payer: Managed Care, Other (non HMO)

## 2016-06-27 DIAGNOSIS — R748 Abnormal levels of other serum enzymes: Secondary | ICD-10-CM | POA: Diagnosis not present

## 2016-06-27 DIAGNOSIS — R7989 Other specified abnormal findings of blood chemistry: Secondary | ICD-10-CM

## 2016-06-27 DIAGNOSIS — E039 Hypothyroidism, unspecified: Secondary | ICD-10-CM

## 2016-06-27 LAB — TSH: TSH: 1.02 u[IU]/mL (ref 0.35–4.50)

## 2016-06-27 LAB — VITAMIN D 25 HYDROXY (VIT D DEFICIENCY, FRACTURES): VITD: 37.21 ng/mL (ref 30.00–100.00)

## 2016-06-27 LAB — ALKALINE PHOSPHATASE: Alkaline Phosphatase: 162 U/L — ABNORMAL HIGH (ref 39–117)

## 2016-07-01 LAB — MITOCHONDRIAL ANTIBODIES: Mitochondrial M2 Ab, IgG: <=20 Units (ref <=20.0)

## 2016-07-04 ENCOUNTER — Telehealth: Payer: Self-pay | Admitting: Internal Medicine

## 2016-07-04 NOTE — Telephone Encounter (Signed)
° °  Pt call to say she had labs drawn last Thursday and would like a call back with the results

## 2016-07-05 ENCOUNTER — Telehealth: Payer: Self-pay | Admitting: Internal Medicine

## 2016-07-05 NOTE — Telephone Encounter (Signed)
See result note   And plan for follow up  (All results are in now )

## 2016-07-05 NOTE — Telephone Encounter (Signed)
Misty pt returned your call °

## 2016-07-05 NOTE — Telephone Encounter (Signed)
See result note.  

## 2016-07-05 NOTE — Telephone Encounter (Signed)
Noted  

## 2016-07-08 ENCOUNTER — Other Ambulatory Visit: Payer: Self-pay | Admitting: Family Medicine

## 2016-07-08 DIAGNOSIS — R7989 Other specified abnormal findings of blood chemistry: Secondary | ICD-10-CM

## 2016-07-08 DIAGNOSIS — R945 Abnormal results of liver function studies: Secondary | ICD-10-CM

## 2016-08-23 ENCOUNTER — Ambulatory Visit (INDEPENDENT_AMBULATORY_CARE_PROVIDER_SITE_OTHER): Payer: Managed Care, Other (non HMO) | Admitting: Family Medicine

## 2016-08-23 DIAGNOSIS — Z23 Encounter for immunization: Secondary | ICD-10-CM | POA: Diagnosis not present

## 2016-09-25 ENCOUNTER — Other Ambulatory Visit (INDEPENDENT_AMBULATORY_CARE_PROVIDER_SITE_OTHER): Payer: Managed Care, Other (non HMO)

## 2016-09-25 DIAGNOSIS — R7989 Other specified abnormal findings of blood chemistry: Secondary | ICD-10-CM | POA: Diagnosis not present

## 2016-09-25 DIAGNOSIS — R945 Abnormal results of liver function studies: Secondary | ICD-10-CM

## 2016-09-25 LAB — HEPATIC FUNCTION PANEL
ALBUMIN: 4.1 g/dL (ref 3.5–5.2)
ALT: 23 U/L (ref 0–35)
AST: 22 U/L (ref 0–37)
Alkaline Phosphatase: 130 U/L — ABNORMAL HIGH (ref 39–117)
BILIRUBIN DIRECT: 0.1 mg/dL (ref 0.0–0.3)
TOTAL PROTEIN: 6.8 g/dL (ref 6.0–8.3)
Total Bilirubin: 0.5 mg/dL (ref 0.2–1.2)

## 2016-10-02 ENCOUNTER — Other Ambulatory Visit: Payer: Self-pay | Admitting: Family Medicine

## 2016-10-02 ENCOUNTER — Telehealth: Payer: Self-pay | Admitting: Internal Medicine

## 2016-10-02 DIAGNOSIS — R7989 Other specified abnormal findings of blood chemistry: Secondary | ICD-10-CM

## 2016-10-02 DIAGNOSIS — Z0000 Encounter for general adult medical examination without abnormal findings: Secondary | ICD-10-CM

## 2016-10-02 NOTE — Telephone Encounter (Signed)
Spoke to the pt and informed her of results.  She is taking vitamin D daily.  She is not sure how much.  She will call back to schedule cpx in April.

## 2016-10-02 NOTE — Telephone Encounter (Signed)
See  Result note  Apologies for lat review   Readings  Alkaline phosphatase is much better although still up some 130  Make sure getting enough vit d   Plan follow up  at cpx  With labs to include the liver panel  and    Add vit d level ( Dx low vit d   ) When due in April  2018

## 2016-10-02 NOTE — Telephone Encounter (Signed)
Pt needs blood work results °

## 2016-11-14 ENCOUNTER — Other Ambulatory Visit: Payer: Self-pay | Admitting: Internal Medicine

## 2016-12-30 ENCOUNTER — Other Ambulatory Visit: Payer: Self-pay | Admitting: Internal Medicine

## 2017-01-01 NOTE — Telephone Encounter (Signed)
Sent to the pharmacy by e-scribe.  Pt has upcoming yearly on 03/05/17

## 2017-02-25 ENCOUNTER — Other Ambulatory Visit: Payer: Self-pay | Admitting: Family Medicine

## 2017-02-25 DIAGNOSIS — I10 Essential (primary) hypertension: Secondary | ICD-10-CM

## 2017-02-25 DIAGNOSIS — E785 Hyperlipidemia, unspecified: Secondary | ICD-10-CM

## 2017-02-25 DIAGNOSIS — E039 Hypothyroidism, unspecified: Secondary | ICD-10-CM

## 2017-02-25 DIAGNOSIS — R945 Abnormal results of liver function studies: Secondary | ICD-10-CM

## 2017-02-25 DIAGNOSIS — R7989 Other specified abnormal findings of blood chemistry: Secondary | ICD-10-CM

## 2017-02-26 ENCOUNTER — Other Ambulatory Visit (INDEPENDENT_AMBULATORY_CARE_PROVIDER_SITE_OTHER): Payer: Managed Care, Other (non HMO)

## 2017-02-26 DIAGNOSIS — I10 Essential (primary) hypertension: Secondary | ICD-10-CM

## 2017-02-26 DIAGNOSIS — E559 Vitamin D deficiency, unspecified: Secondary | ICD-10-CM

## 2017-02-26 DIAGNOSIS — E039 Hypothyroidism, unspecified: Secondary | ICD-10-CM | POA: Diagnosis not present

## 2017-02-26 DIAGNOSIS — E785 Hyperlipidemia, unspecified: Secondary | ICD-10-CM

## 2017-02-26 DIAGNOSIS — R945 Abnormal results of liver function studies: Secondary | ICD-10-CM

## 2017-02-26 DIAGNOSIS — R7989 Other specified abnormal findings of blood chemistry: Secondary | ICD-10-CM | POA: Diagnosis not present

## 2017-02-26 LAB — BASIC METABOLIC PANEL
BUN: 13 mg/dL (ref 6–23)
CALCIUM: 9.8 mg/dL (ref 8.4–10.5)
CO2: 29 mEq/L (ref 19–32)
Chloride: 104 mEq/L (ref 96–112)
Creatinine, Ser: 0.91 mg/dL (ref 0.40–1.20)
GFR: 66.47 mL/min (ref >60.00)
Glucose, Bld: 84 mg/dL (ref 70–99)
Potassium: 4.6 mEq/L (ref 3.5–5.1)
SODIUM: 139 meq/L (ref 135–145)

## 2017-02-26 LAB — HEPATIC FUNCTION PANEL
ALK PHOS: 154 U/L — AB (ref 39–117)
ALT: 32 U/L (ref 0–35)
AST: 38 U/L — AB (ref 0–37)
Albumin: 4.2 g/dL (ref 3.5–5.2)
BILIRUBIN DIRECT: 0.1 mg/dL (ref 0.0–0.3)
TOTAL PROTEIN: 7.2 g/dL (ref 6.0–8.3)
Total Bilirubin: 0.5 mg/dL (ref 0.2–1.2)

## 2017-02-26 LAB — LIPID PANEL
CHOLESTEROL: 175 mg/dL (ref 0–200)
HDL: 78.8 mg/dL (ref >39.00)
LDL Cholesterol: 83 mg/dL (ref 0–99)
NonHDL: 96.47
TRIGLYCERIDES: 67 mg/dL (ref 0.0–149.0)
Total CHOL/HDL Ratio: 2
VLDL: 13.4 mg/dL (ref 0.0–40.0)

## 2017-02-26 LAB — TSH: TSH: 3.25 u[IU]/mL (ref 0.35–4.50)

## 2017-02-26 LAB — CBC WITH DIFFERENTIAL/PLATELET
BASOS ABS: 0.1 10*3/uL (ref 0.0–0.1)
BASOS PCT: 2.2 % (ref 0.0–3.0)
EOS ABS: 0.4 10*3/uL (ref 0.0–0.7)
Eosinophils Relative: 7.1 % — ABNORMAL HIGH (ref 0.0–5.0)
HCT: 40.4 % (ref 36.0–46.0)
HEMOGLOBIN: 13.6 g/dL (ref 12.0–15.0)
Lymphocytes Relative: 19.9 % (ref 12.0–46.0)
Lymphs Abs: 1 10*3/uL (ref 0.7–4.0)
MCHC: 33.6 g/dL (ref 30.0–36.0)
MCV: 90.5 fl (ref 78.0–100.0)
MONO ABS: 0.5 10*3/uL (ref 0.1–1.0)
Monocytes Relative: 10.4 % (ref 3.0–12.0)
Neutro Abs: 3 10*3/uL (ref 1.4–7.7)
Neutrophils Relative %: 60.4 % (ref 43.0–77.0)
Platelets: 217 10*3/uL (ref 150.0–400.0)
RBC: 4.47 Mil/uL (ref 3.87–5.11)
RDW: 13.6 % (ref 11.5–15.5)
WBC: 5 10*3/uL (ref 4.0–10.5)

## 2017-02-26 LAB — VITAMIN D 25 HYDROXY (VIT D DEFICIENCY, FRACTURES): VITD: 31.17 ng/mL (ref 30.00–100.00)

## 2017-03-04 NOTE — Progress Notes (Signed)
Chief Complaint  Patient presents with  . Annual Exam    HPI: Patient  Dana [name]  61 y.o. comes in today for Preventive Health Care visit  Since her last visit #1 she stopped the HCTZ blood pressure medication but continued on the potassium. Her blood pressure she states is been anywhere from 120-140 depending on who checks it where she is at. Taking cholesterol medicine no side effect. Taking thyroid medicine doesn't miss doses Generally well but feels tired all the time and sometimes irritable. On further questioning she has continued recurrent hot flashes at night and in the day but more problematic at night every few hours that awakens her and her sleep is not restful. No sleep apnea symptoms. Working full-time housework. No hopelessness or crying depression.   Health Maintenance  Topic Date Due  . TETANUS/TDAP  08/31/1973  . PAP SMEAR  04/21/2016  . HIV Screening  03/05/2018 (Originally 08/31/1971)  . INFLUENZA VACCINE  06/25/2017  . MAMMOGRAM  05/10/2018  . COLONOSCOPY  09/29/2023  . Hepatitis C Screening  Completed   Health Maintenance Review LIFESTYLE:  Exercise:  activev not regular  Tobacco/ETS:- Alcohol: - Sugar beverages:- Sleep:interrupted hot flushes  Drug use: no HH of 2 barn cat  Work:ft likes playing with grandchild.    ROS:  ocass nausea worries about cv  Events  fam hx etc taking asa  ocass transient  Palpitations without sx  No racing  GEN/ HEENT: No fever, significant weight changes  headaches vision problems hearing changes, CV/ PULM; No chest pain shortness of breath cough, syncope,edema  change in exercise tolerance. GI /GU: No adominal pain, vomiting, change in bowel habits. No blood in the stool. No significant GU symptoms. SKIN/HEME: ,no acute skin rashes suspicious lesions or bleeding. No lymphadenopathy, nodules, masses.  NEURO/ PSYCH:  No neurologic signs such as weakness numbness. No depression anxiety see above . IMM/ Allergy: No unusual  infections.  Allergy .   REST of 12 system review negative except as per HPI   Past Medical History:  Diagnosis Date  . Abnormal finding on MRI of brain 01/10/2015  . CVA (cerebral infarction) 2010   diplopia neg acute MRi whit matter changes neg LP   . High blood pressure    readings  . High cholesterol   . Hx of varicella   . Migraine with aura    auras  . Non-cardiac chest pain    hops and evaluation 2011 dr Lia Foyer  . Other and unspecified hyperlipidemia 04/27/2012   Has been on meds for a number of years .  Low dose crestor .  Baseline reported as 280/   . PFO (patent foramen ovale)    Moderate size  . Pneumonia 2007   and flu hospitalized   . Stroke [facility])    on hrt double vision- possible blood clot on brain; Dr. Floyde Parkins at [facility]   . Thyroid disease     Past Surgical History:  Procedure Laterality Date  . BREAST SURGERY     implants  . COLONOSCOPY    . FOOT SURGERY     had bunyon removed  . TONSILLECTOMY      Family History  Problem Relation Age of Onset  . Adopted: Yes  . Cancer Mother     pancreatic  . Alcohol abuse Mother   . Breast cancer Maternal Aunt     Social History   Social History  . Marital status: Married    Spouse  name: N/A  . Number of children: 1  . Years of education: N/A   Social History Main Topics  . Smoking status: Never Smoker  . Smokeless tobacco: Never Used  . Alcohol use No  . Drug use: No  . Sexual activity: Yes   Other Topics Concern  . 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    Neg tad   Some caffiene    Seatbelts no ets fa stored    G1P1   Physically active     29 yo grandaughter    Patient is left handed.          Outpatient Medications Prior to Visit  Medication Sig Dispense Refill  . aspirin 81 MG  tablet Take 81 mg by mouth daily.    Marland Kitchen atorvastatin (LIPITOR) 20 MG tablet TAKE 1 TABLET BY MOUTH EVERY DAY 90 tablet 0  . levothyroxine (SYNTHROID, LEVOTHROID) 75 MCG tablet Take 1 tablet (75 mcg total) by mouth daily. 90 tablet 3  . Omega-3 Fatty Acids (FISH OIL) 1000 MG CAPS Take 1,000 mg by mouth.     No facility-administered medications prior to visit.      EXAM:  BP 130/80 (BP Location: Right Arm, Patient Position: Sitting, Cuff Size: Normal)   Pulse 84   Temp 98.4 F (36.9 C) (Oral)   Ht 5' 5"  (1.651 m)   Wt 131 lb 12.8 oz (59.8 kg)   LMP 04/25/2009   BMI 21.93 kg/m   Body mass index is 21.93 kg/m. Wt Readings from Last 3 Encounters:  03/05/17 131 lb 12.8 oz (59.8 kg)  04/09/16 134 lb (60.8 kg)  02/28/16 133 lb 2 oz (60.4 kg)    Physical Exam: Vital signs reviewed OMA:YOKH is a well-developed well-nourished alert cooperative    who appearsr stated age in no acute distress.  HEENT: normocephalic atraumatic , Eyes: PERRL EOM's full, conjunctiva clear, Nares: paten,t no deformity discharge or tenderness., Ears: no deformity EAC's clear TMs with normal landmarks. Mouth: clear OP, no lesions, edema.  Moist mucous membranes. Dentition in adequate repair. NECK: supple without masses, thyromegaly or bruits. CHEST/PULM:  Clear to auscultation and percussion breath sounds equal no wheeze , rales or rhonchi. No chest wall deformities or tenderness. Breast: normal by inspection . No dimpling, discharge, masses, tenderness or discharge .  Implants  CV: PMI is nondisplaced, S1 S2 no gallops, murmurs, rubs. Peripheral pulses are full without delay.No JVD .  ABDOMEN: Bowel sounds normal nontender  No guard or rebound, no hepato splenomegal no CVA tenderness.  No hernia. Extremtities:  No clubbing cyanosis or edema, no acute joint swelling or redness no focal atrophy NEURO:  Oriented x3, cranial nerves 3-12 appear to be intact, no obvious focal weakness,gait within normal limits no  abnormal reflexes or asymmetrical SKIN: No acute rashes normal turgor, color, no bruising or petechiae. PSYCH: Oriented, good eye contact, no obvious depression anxiety, cognition and judgment appear normal. LN: no cervical axillary inguinal adenopathy  Lab Results  Component Value Date   WBC 5.0 02/26/2017   HGB 13.6 02/26/2017   HCT 40.4 02/26/2017   PLT 217.0 02/26/2017   GLUCOSE 84 02/26/2017   CHOL 175 02/26/2017   TRIG 67.0  02/26/2017   HDL 78.80 02/26/2017   LDLCALC 83 02/26/2017   ALT 32 02/26/2017   AST 38 (H) 02/26/2017   NA 139 02/26/2017   K 4.6 02/26/2017   CL 104 02/26/2017   CREATININE 0.91 02/26/2017   BUN 13 02/26/2017   CO2 29 02/26/2017   TSH 3.25 02/26/2017   INR 0.97 03/01/2010    BP Readings from Last 3 Encounters:  03/05/17 130/80  04/09/16 132/80  02/28/16 110/68   Lab Results  Component Value Date   ALKPHOS 154 (H) 02/26/2017    Lab results reviewed with patient   ASSESSMENT AND PLAN:  Discussed the following assessment and plan:  Visit for preventive health examination  Hyperlipidemia, unspecified hyperlipidemia type  Hypothyroidism, unspecified type - cont  same med   Abnormal LFTs - alk phos review record for plan   Menopausal hot flushes - quite problematic  interfering risk with hrt?  can try low dose ssri since some irritability involved .  uncdertain about the alk phos and lfts  mayu get gi to opine ? Gi  Will do record review about this   Of note she has PFO  But no documented  cva  not tia  If  Closure  Indicated.  Will review record and make plan  If needed   Dr Jannifer Franklin assessment wa migraine anda not cva or  tia. Disc bp control  And fu  Come back with  Monitor    Patient Care Team: Burnis Medin, MD as PCP - General (Internal Medicine) Hillary Bow, MD (Cardiology) Brien Few, MD as Attending Physician (Obstetrics and Gynecology) Kathrynn Ducking, MD as Consulting Physician (Neurology) Patient Instructions    We can try low dose SSRI citalopram for your hot flushes sleep problem and irritability. He would take it every day you can take it at night if you feel sedated. This medicine may work over time as we discussed  Check blood pressure readings twice a day for 5 days in a row record them Bring her monitor in your readings back when you follow-up in 2-3 months. Stop the potassium tablets because you are no longer taking the diuretic pill. Newer BP goal is 120/80 range    I will review your record in regard to the lab test alkaline phosphatase abnormality. I may have [place] repeat some lab tests before you come back in a few months. Sometimes all do a referral as a consult. I don't think it's related to your hot flashes.       Standley Brooking. Kemya Shed M.D.

## 2017-03-05 ENCOUNTER — Encounter: Payer: Self-pay | Admitting: Internal Medicine

## 2017-03-05 ENCOUNTER — Ambulatory Visit (INDEPENDENT_AMBULATORY_CARE_PROVIDER_SITE_OTHER): Payer: Managed Care, Other (non HMO) | Admitting: Internal Medicine

## 2017-03-05 VITALS — BP 130/80 | HR 84 | Temp 98.4°F | Ht 65.0 in | Wt 131.8 lb

## 2017-03-05 DIAGNOSIS — Z0000 Encounter for general adult medical examination without abnormal findings: Secondary | ICD-10-CM | POA: Diagnosis not present

## 2017-03-05 DIAGNOSIS — N951 Menopausal and female climacteric states: Secondary | ICD-10-CM

## 2017-03-05 DIAGNOSIS — E785 Hyperlipidemia, unspecified: Secondary | ICD-10-CM | POA: Diagnosis not present

## 2017-03-05 DIAGNOSIS — E039 Hypothyroidism, unspecified: Secondary | ICD-10-CM | POA: Diagnosis not present

## 2017-03-05 DIAGNOSIS — R7989 Other specified abnormal findings of blood chemistry: Secondary | ICD-10-CM

## 2017-03-05 DIAGNOSIS — R945 Abnormal results of liver function studies: Secondary | ICD-10-CM

## 2017-03-05 MED ORDER — CITALOPRAM HYDROBROMIDE 10 MG PO TABS: 10.0000 mg | ORAL_TABLET | Freq: Every day | ORAL | 3 refills | Status: DC

## 2017-03-05 NOTE — Patient Instructions (Addendum)
We can try low dose SSRI citalopram for your hot flushes sleep problem and irritability. He would take it every day you can take it at night if you feel sedated. This medicine may work over time as we discussed  Check blood pressure readings twice a day for 5 days in a row record them Bring her monitor in your readings back when you follow-up in 2-3 months. Stop the potassium tablets because you are no longer taking the diuretic pill. Newer BP goal is 120/80 range    I will review your record in regard to the lab test alkaline phosphatase abnormality. I may have [place] repeat some lab tests before you come back in a few months. Sometimes all do a referral as a consult. I don't think it's related to your hot flashes.

## 2017-03-06 ENCOUNTER — Encounter: Payer: Self-pay | Admitting: Podiatry

## 2017-03-06 ENCOUNTER — Ambulatory Visit (INDEPENDENT_AMBULATORY_CARE_PROVIDER_SITE_OTHER): Payer: Managed Care, Other (non HMO) | Admitting: Podiatry

## 2017-03-06 DIAGNOSIS — B351 Tinea unguium: Secondary | ICD-10-CM

## 2017-03-06 DIAGNOSIS — L603 Nail dystrophy: Secondary | ICD-10-CM

## 2017-03-06 MED ORDER — NONFORMULARY OR COMPOUNDED ITEM: 0 refills | Status: DC

## 2017-03-06 NOTE — Progress Notes (Signed)
Subjective: 61 year old female presents the office for concerns of possible right third digit toenail fungus. She states that last part of the nail did come off. She denies any pain of the nail which she is concerned about a nail fungus of the nails to split at times. Denies any pain to the toenails states she denies any redness or drainage or any swelling. Denies any systemic complaints such as fevers, chills, nausea, vomiting. No acute changes since last appointment, and no other complaints at this time.   Objective: AAO x3, NAD DP/PT pulses palpable bilaterally, CRT less than 3 seconds Right third digit toenail appears to be partially moves appears to be somewhat dystrophic as well as minimally discolored. There is no pain to the toenail is no surrounding redness or drainage. Remainder the toenails appear to have a small amount of cracking within the distal portion the nails and there is mild discoloration. There is no tenderness the other toenails. There is no signs of infection. The scars from prior bunion surgeries are well-healed. No open lesions or pre-ulcerative lesions.  No pain with calf compression, swelling, warmth, erythema  Assessment: Onychodystrophy, possible onychomycosis.  Plan: -All treatment options discussed with the patient including all alternatives, risks, complications.  -I discussed with her the etiology of the nails. Similarly toenail fungus and she wishes to have him start treatment in case of this. I ordered a compound cream today through [employer] for onychomycosis. -Will have to watch and wait to see if the toenail grows out. It causes significant issues call the office. -Patient encouraged to call the office with any questions, concerns, change in symptoms.   Ovid Curd, DPM

## 2017-03-12 ENCOUNTER — Encounter: Payer: Self-pay | Admitting: Internal Medicine

## 2017-03-16 ENCOUNTER — Encounter: Payer: Self-pay | Admitting: Podiatry

## 2017-03-22 ENCOUNTER — Encounter: Payer: Self-pay | Admitting: Internal Medicine

## 2017-03-24 ENCOUNTER — Encounter: Payer: Self-pay | Admitting: Internal Medicine

## 2017-03-24 NOTE — Telephone Encounter (Signed)
-----   Message -----  From: Ida Rogue  Sent: 03/24/2017  7:59 AM  To: [facility] Clinical Pool  Subject: Non-Urgent Medical Question             Disregard email about arm pit soreness- it went away?

## 2017-03-26 ENCOUNTER — Other Ambulatory Visit: Payer: Self-pay | Admitting: Internal Medicine

## 2017-03-31 ENCOUNTER — Other Ambulatory Visit: Payer: Self-pay | Admitting: Internal Medicine

## 2017-04-22 ENCOUNTER — Encounter: Payer: Self-pay | Admitting: Internal Medicine

## 2017-04-22 ENCOUNTER — Ambulatory Visit (INDEPENDENT_AMBULATORY_CARE_PROVIDER_SITE_OTHER)
Admission: RE | Admit: 2017-04-22 | Discharge: 2017-04-22 | Disposition: A | Discharge status: Home / Self Care | Payer: Managed Care, Other (non HMO) | Source: Ambulatory Visit | Attending: Internal Medicine | Admitting: Internal Medicine

## 2017-04-22 ENCOUNTER — Ambulatory Visit (INDEPENDENT_AMBULATORY_CARE_PROVIDER_SITE_OTHER): Payer: Managed Care, Other (non HMO) | Admitting: Internal Medicine

## 2017-04-22 VITALS — BP 164/86 | HR 87 | Temp 98.2°F | Ht 65.0 in | Wt 131.0 lb

## 2017-04-22 DIAGNOSIS — R079 Chest pain, unspecified: Secondary | ICD-10-CM

## 2017-04-22 DIAGNOSIS — R030 Elevated blood-pressure reading, without diagnosis of hypertension: Secondary | ICD-10-CM

## 2017-04-22 DIAGNOSIS — R198 Other specified symptoms and signs involving the digestive system and abdomen: Secondary | ICD-10-CM

## 2017-04-22 LAB — COMPREHENSIVE METABOLIC PANEL
ALBUMIN: 4.3 g/dL (ref 3.5–5.2)
ALK PHOS: 121 U/L — AB (ref 39–117)
ALT: 32 U/L (ref 0–35)
AST: 33 U/L (ref 0–37)
BILIRUBIN TOTAL: 0.4 mg/dL (ref 0.2–1.2)
BUN: 11 mg/dL (ref 6–23)
CO2: 29 mEq/L (ref 19–32)
CREATININE: 0.85 mg/dL (ref 0.40–1.20)
Calcium: 9.9 mg/dL (ref 8.4–10.5)
Chloride: 105 mEq/L (ref 96–112)
GFR: 71.88 mL/min (ref >60.00)
Glucose, Bld: 97 mg/dL (ref 70–99)
Potassium: 4.1 mEq/L (ref 3.5–5.1)
Sodium: 140 mEq/L (ref 135–145)
TOTAL PROTEIN: 7.2 g/dL (ref 6.0–8.3)

## 2017-04-22 LAB — CBC WITH DIFFERENTIAL/PLATELET
Basophils Absolute: 0.1 10*3/uL (ref 0.0–0.1)
Basophils Relative: 1.1 % (ref 0.0–3.0)
EOS PCT: 3.1 % (ref 0.0–5.0)
Eosinophils Absolute: 0.2 10*3/uL (ref 0.0–0.7)
HCT: 40.1 % (ref 36.0–46.0)
Hemoglobin: 13.3 g/dL (ref 12.0–15.0)
LYMPHS ABS: 1.2 10*3/uL (ref 0.7–4.0)
Lymphocytes Relative: 19.4 % (ref 12.0–46.0)
MCHC: 33.1 g/dL (ref 30.0–36.0)
MCV: 91.1 fl (ref 78.0–100.0)
MONO ABS: 0.6 10*3/uL (ref 0.1–1.0)
MONOS PCT: 9.7 % (ref 3.0–12.0)
NEUTROS ABS: 4 10*3/uL (ref 1.4–7.7)
NEUTROS PCT: 66.7 % (ref 43.0–77.0)
Platelets: 224 10*3/uL (ref 150.0–400.0)
RBC: 4.4 Mil/uL (ref 3.87–5.11)
RDW: 14 % (ref 11.5–15.5)
WBC: 6 10*3/uL (ref 4.0–10.5)

## 2017-04-22 LAB — SEDIMENTATION RATE: SED RATE: 5 mm/h (ref 0–30)

## 2017-04-22 LAB — TROPONIN I: TNIDX: 0.01 ug/l (ref 0.00–0.06)

## 2017-04-22 MED ORDER — PANTOPRAZOLE SODIUM 40 MG PO TBEC: 40.0000 mg | DELAYED_RELEASE_TABLET | Freq: Every day | ORAL | 0 refills | Status: DC

## 2017-04-22 NOTE — Patient Instructions (Signed)
Your EKG is normal. Your blood pressure is elevated but that could be from the situation. We are checking blood tests today It is most likely that your problem is an esophageal spasm of some sort. We'll get lab work today and begin you on a acid blocker every day and plan close follow-up. We'll probably get GI specialists to see you   Get a  Chest x ray  Also  I put I norder .   If things get terribly worse seek emergent care on call the on  call system

## 2017-04-22 NOTE — Progress Notes (Signed)
Chief Complaint  Patient presents with  . Acute Visit    started at 3am this morning   . Fatigue    HPI: Dana [name] 61 y.o.    sda   Cause of pain that awoken her late night .  Onset at 3 AM of the continuous pain in the lower substernal high epigastric area without radiation some sweating that could've been from hot flashes but no nausea vomiting shortness of breath or cough. The pain was unrelenting until about 9 AM she did take over-the-counter Gaviscon and didn't make much of a difference. However the pain is worse was worse when she laid down. Lastw eek at work  Had   Low epigastric pain   . Lasted  2 hours  .  Constant  Couldn't .  Worse with laying down .  Sweating   ? Flush or  This .  No resp distress   No dyshpagia and no heart burn .   Lat ate some chips 2 hours before bed.  Pain is now 2/10 or minimal but was hgih 9? /10 this am ? bp not usually up . Like this   ROS: See pertinent positives and negatives per HPI. No sob syncope  ? If had discomfort left upper back?   Now she feels quite tired  But non specific  sleepy   Past Medical History:  Diagnosis Date  . Abnormal finding on MRI of brain 01/10/2015  . CVA (cerebral infarction) 2010   diplopia neg acute MRi whit matter changes neg LP   . High blood pressure    readings  . High cholesterol   . Hx of varicella   . Migraine with aura    auras  . Non-cardiac chest pain    hops and evaluation 2011 dr Riley Kill  . Other and unspecified hyperlipidemia 04/27/2012   Has been on meds for a number of years .  Low dose crestor .  Baseline reported as 280/   . PFO (patent foramen ovale)    Moderate size  . Pneumonia 2007   and flu hospitalized   . Stroke [facility])    on hrt double vision- possible blood clot on brain; Dr. Lesia Sago at [facility]   . Thyroid disease     Family History  Problem Relation Age of Onset  . Adopted: Yes  . Cancer Mother        pancreatic  . Alcohol abuse Mother   . Breast cancer  Maternal Aunt     Social History   Social History  . Marital status: Married    Spouse name: N/A  . Number of children: 1  . Years of education: N/A   Social History Main Topics  . Smoking status: Never Smoker  . Smokeless tobacco: Never Used  . Alcohol use No  . Drug use: No  . Sexual activity: Yes   Other Topics Concern  . 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    Neg tad   Some caffiene    Seatbelts no ets fa stored    G1P1   Physically active     5 yo grandaughter    Patient is left handed.  Outpatient Medications Prior to Visit  Medication Sig Dispense Refill  . aspirin 81 MG tablet Take 81 mg by mouth daily.    Marland Kitchen. atorvastatin (LIPITOR) 20 MG tablet TAKE 1 TABLET BY MOUTH EVERY DAY 90 tablet 3  . citalopram (CELEXA) 10 MG tablet Take 1 tablet (10 mg total) by mouth daily. 30 tablet 3  . levothyroxine (SYNTHROID, LEVOTHROID) 75 MCG tablet TAKE 1 TABLET (75 MCG TOTAL) BY MOUTH DAILY. 90 tablet 3  . NONFORMULARY OR COMPOUNDED ITEM [facility]  Onychomycosis Nail Lacquer -  Fluconazole 2%, Terbinafine 1% DMSO Apply to affected nail once daily Qty. 120 gm 3 refills 1 each 0   No facility-administered medications prior to visit.      EXAM:  BP (!) 164/86 (BP Location: Right Arm, Patient Position: Sitting, Cuff Size: Normal)   Pulse 87   Temp 98.2 F (36.8 C) (Oral)   Ht 5\' 5"  (1.651 m)   Wt 131 lb (59.4 kg)   LMP 04/25/2009   SpO2 98%   BMI 21.80 kg/m   Body mass index is 21.8 kg/m.  GENERAL: vitals reviewed and listed above, alert, oriented, appears well hydrated and in no acute distress HEENT: atraumatic, conjunctiva  clear, no obvious abnormalities on inspection of external nose and ears OP : no lesion edema or exudate  NECK: no obvious masses  on inspection palpation  LUNGS: clear to auscultation bilaterally, no wheezes, rales or rhonchi, good air movement CV: HRRR, no clubbing cyanosis or  peripheral edema nl cap refill  Abdomen:  Sof,t normal bowel sounds without hepatosplenomegaly, no guarding rebound or masses no CVA tenderness points to lower  Sternum and jsut above xyphoid? MS: moves all extremities without noticeable focal  abnormality PSYCH: pleasant and cooperative, no obvious depression or anxiety Lab Results  Component Value Date   WBC 6.0 04/22/2017   HGB 13.3 04/22/2017   HCT 40.1 04/22/2017   PLT 224.0 04/22/2017   GLUCOSE 97 04/22/2017   CHOL 175 02/26/2017   TRIG 67.0 02/26/2017   HDL 78.80 02/26/2017   LDLCALC 83 02/26/2017   ALT 32 04/22/2017   AST 33 04/22/2017   NA 140 04/22/2017   K 4.1 04/22/2017   CL 105 04/22/2017   CREATININE 0.85 04/22/2017   BUN 11 04/22/2017   CO2 29 04/22/2017   TSH 3.25 02/26/2017   INR 0.97 03/01/2010  EKG NSR no acute changes  Normal   ASSESSMENT AND PLAN:  Discussed the following assessment and plan:  Chest pain, unspecified type - Plan: EKG 12-Lead, CBC with Differential/Platelet, Comprehensive metabolic panel, Sedimentation rate, Troponin I, DG Chest 2 View  GI symptoms - Plan: CBC with Differential/Platelet, Comprehensive metabolic panel, Sedimentation rate, Troponin I  Elevated blood pressure reading - Plan: CBC with Differential/Platelet, Comprehensive metabolic panel, Sedimentation rate, Troponin I, DG Chest 2 View Lower chest pain sub sternal upper epigastrum of significance  Awoken from sleep without assoc sx   But severe.  ? If esophageal or not  cv risk factors age and lipids  c xray  Gi eval  Poss  Add ppi and close fu  -Patient advised to return or notify health care team  if symptoms worsen ,persist or new concerns arise. In the interim  c xrya neg but does have atherosclerosis   Thoracic aorta .  May  Get cards assessment  Patient Instructions  Your  EKG is normal. Your blood pressure is elevated but that could be from the situation. We are checking blood tests  today It is most likely that your problem is an esophageal spasm of some sort. We'll get lab work today and begin you on a acid blocker every day and plan close follow-up. We'll probably get GI specialists to see you   Get a  Chest x ray  Also  I put I norder .   If things get terribly worse seek emergent care on call the on  call system       Burna Mortimer K. Panosh M.D.

## 2017-04-23 ENCOUNTER — Encounter: Payer: Self-pay | Admitting: Internal Medicine

## 2017-04-23 ENCOUNTER — Other Ambulatory Visit: Payer: Self-pay

## 2017-04-23 ENCOUNTER — Telehealth: Payer: Self-pay | Admitting: Emergency Medicine

## 2017-04-23 MED ORDER — PANTOPRAZOLE SODIUM 40 MG PO TBEC: 40.0000 mg | DELAYED_RELEASE_TABLET | Freq: Every day | ORAL | 0 refills | Status: DC

## 2017-04-23 NOTE — Telephone Encounter (Signed)
Spoke to pt about insurance not covering Protonix instead Dr. Fabian SharpPanosh wants her to try Prilosec 20 mg OTC 1 daily. Pt understood and had no further questions

## 2017-05-05 NOTE — Progress Notes (Signed)
Chief Complaint  Patient presents with  . Follow-up    HPI: Dana [name] 61 y.o.  Fu  abd chest pain  And eval  See past note    epside of pain 5 29  And fu  Says she is all better and  No recurrence  taking otc prilosec cause insurance denies ppis.  No cp sob  Here with small child today . No breathing issue also   Also has hx of elevated alk phos Would liek to weant the citalopram for flushes   Doing ok  ROS: See pertinent positives and negatives per HPI.  Past Medical History:  Diagnosis Date  . Abnormal finding on MRI of brain 01/10/2015  . CVA (cerebral infarction) 2010   diplopia neg acute MRi whit matter changes neg LP   . High blood pressure    readings  . High cholesterol   . Hx of varicella   . Migraine with aura    auras  . Non-cardiac chest pain    hops and evaluation 2011 dr Lia Foyer  . Other and unspecified hyperlipidemia 04/27/2012   Has been on meds for a number of years .  Low dose crestor .  Baseline reported as 280/   . PFO (patent foramen ovale)    Moderate size  . Pneumonia 2007   and flu hospitalized   . Stroke [facility])    on hrt double vision- possible blood clot on brain; Dr. Floyde Parkins at [facility]   . Thyroid disease     Family History  Problem Relation Age of Onset  . Adopted: Yes  . Cancer Mother        pancreatic  . Alcohol abuse Mother   . Breast cancer Maternal Aunt     Social History   Social History  . Marital status: Married    Spouse name: N/A  . Number of children: 1  . Years of education: N/A   Social History Main Topics  . Smoking status: Never Smoker  . Smokeless tobacco: Never Used  . Alcohol use No  . Drug use: No  . Sexual activity: Yes   Other Topics Concern  . 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    Neg tad   Some caffiene    Seatbelts no ets fa stored    G1P1   Physically active     31 yo grandaughter    Patient is left handed.          Outpatient Medications Prior to Visit  Medication Sig Dispense Refill  . aspirin 81 MG tablet Take 81 mg by mouth daily.    Marland Kitchen atorvastatin (LIPITOR) 20 MG tablet TAKE 1 TABLET BY MOUTH EVERY DAY 90 tablet 3  . citalopram (CELEXA) 10 MG tablet Take 1 tablet (10 mg total) by mouth daily. 30 tablet 3  . levothyroxine (SYNTHROID, LEVOTHROID) 75 MCG tablet TAKE 1 TABLET (75 MCG TOTAL) BY MOUTH DAILY. 90 tablet 3  . NONFORMULARY OR COMPOUNDED ITEM [facility]  Onychomycosis Nail Lacquer -  Fluconazole 2%, Terbinafine 1% DMSO Apply to affected nail once daily Qty. 120 gm 3 refills (Patient not taking: Reported on 05/06/2017) 1 each 0  .  pantoprazole (PROTONIX) 40 MG tablet Take 1 tablet (40 mg total) by mouth daily. (Patient not taking: Reported on 05/06/2017) 90 tablet 0   No facility-administered medications prior to visit.      EXAM:  BP 120/80 (BP Location: Right Arm, Patient Position: Sitting, Cuff Size: Normal)   Pulse 65   Temp 98.4 F (36.9 C) (Oral)   Ht 5' 5"  (1.651 m)   Wt 132 lb 3.2 oz (60 kg)   LMP 04/25/2009   BMI 22.00 kg/m   Body mass index is 22 kg/m.  GENERAL: vitals reviewed and listed above, alert, oriented, appears well hydrated and in no acute distress HEENT: atraumatic, conjunctiva  clear, no obvious abnormalities on inspection of external nose and ears NECK: no obvious masses on inspection palpation  LUNGS: clear to auscultation bilaterally, no wheezes, rales or rhonchi, good air movement CV: HRRR, no clubbing cyanosis or  peripheral edema nl cap refill  MS: moves all extremities without noticeable focal  abnormality PSYCH: pleasant and cooperative, no obvious depression or anxiety Lab Results  Component Value Date   WBC 6.0 04/22/2017   HGB 13.3 04/22/2017   HCT 40.1 04/22/2017   PLT 224.0 04/22/2017    GLUCOSE 97 04/22/2017   CHOL 175 02/26/2017   TRIG 67.0 02/26/2017   HDL 78.80 02/26/2017   LDLCALC 83 02/26/2017   ALT 32 04/22/2017   AST 33 04/22/2017   NA 140 04/22/2017   K 4.1 04/22/2017   CL 105 04/22/2017   CREATININE 0.85 04/22/2017   BUN 11 04/22/2017   CO2 29 04/22/2017   TSH 3.25 02/26/2017   INR 0.97 03/01/2010   BP Readings from Last 3 Encounters:  05/06/17 120/80  04/22/17 (!) 164/86  03/05/17 130/80   Wt Readings from Last 3 Encounters:  05/06/17 132 lb 3.2 oz (60 kg)  04/22/17 131 lb (59.4 kg)  03/05/17 131 lb 12.8 oz (59.8 kg)    Labs reviewed  ASSESSMENT AND PLAN:  Discussed the following assessment and plan:  Chest pain, unspecified type  Elevated blood pressure reading - better this visit   Medication management - ok to wean citalopram and follow   Alkaline phosphatase elevation - much better stil elevvated  and follow - Plan: Hepatic function panel At this time  Disc with patient  Will follow and  Refer if recurrent or other issues  After off meds   -Patient advised to return or notify health care team  if symptoms worsen ,persist or new concerns arise.  Patient Instructions   Glad you are doing better. I suspect this could've been esophageal spasm or inflammation. Stay on the over-the-counter Prilosec for another 2 weeks and then go every other day and off. If your symptoms recur contact us and recheck. In regard to citalopram alternate dose 10/5 every other day for 1-2 weeks Then decrease to 5 mg a day or 2-3 weeks Then alternate off 5 mg for another few weeks until you are off. If you have any concerns she can stay at a  dose  And wean slower .    Lab Results  Component Value Date   WBC 6.0 04/22/2017   HGB 13.3 04/22/2017   HCT 40.1 04/22/2017   PLT 224.0 04/22/2017   GLUCOSE 97 04/22/2017   CHOL 175 02/26/2017   TRIG 67.0 02/26/2017   HDL 78.80 02/26/2017   LDLCALC 83 02/26/2017   ALT 32 04/22/2017   AST 33 04/22/2017   NA  140 04/22/2017   K  4.1 04/22/2017   CL 105 04/22/2017   CREATININE 0.85 04/22/2017   BUN 11 04/22/2017   CO2 29 04/22/2017   TSH 3.25 02/26/2017   INR 0.97 03/01/2010        Mariann Laster K. Panosh M.D.

## 2017-05-06 ENCOUNTER — Ambulatory Visit (INDEPENDENT_AMBULATORY_CARE_PROVIDER_SITE_OTHER): Payer: Managed Care, Other (non HMO) | Admitting: Internal Medicine

## 2017-05-06 VITALS — BP 120/80 | HR 65 | Temp 98.4°F | Ht 65.0 in | Wt 132.2 lb

## 2017-05-06 DIAGNOSIS — R030 Elevated blood-pressure reading, without diagnosis of hypertension: Secondary | ICD-10-CM

## 2017-05-06 DIAGNOSIS — R748 Abnormal levels of other serum enzymes: Secondary | ICD-10-CM | POA: Diagnosis not present

## 2017-05-06 DIAGNOSIS — Z79899 Other long term (current) drug therapy: Secondary | ICD-10-CM

## 2017-05-06 DIAGNOSIS — R079 Chest pain, unspecified: Secondary | ICD-10-CM

## 2017-05-06 NOTE — Patient Instructions (Addendum)
Glad you are doing better. I suspect this could've been esophageal spasm or inflammation. Stay on the over-the-counter Prilosec for another 2 weeks and then go every other day and off. If your symptoms recur contact us and recheck. In regard to citalopram alternate dose 10/5 every other day for 1-2 weeks Then decrease to 5 mg a day or 2-3 weeks Then alternate off 5 mg for another few weeks until you are off. If you have any concerns she can stay at a  dose  And wean slower .    Lab Results  Component Value Date   WBC 6.0 04/22/2017   HGB 13.3 04/22/2017   HCT 40.1 04/22/2017   PLT 224.0 04/22/2017   GLUCOSE 97 04/22/2017   CHOL 175 02/26/2017   TRIG 67.0 02/26/2017   HDL 78.80 02/26/2017   LDLCALC 83 02/26/2017   ALT 32 04/22/2017   AST 33 04/22/2017   NA 140 04/22/2017   K 4.1 04/22/2017   CL 105 04/22/2017   CREATININE 0.85 04/22/2017   BUN 11 04/22/2017   CO2 29 04/22/2017   TSH 3.25 02/26/2017   INR 0.97 03/01/2010

## 2017-05-10 ENCOUNTER — Encounter: Payer: Self-pay | Admitting: Internal Medicine

## 2017-06-13 LAB — HM MAMMOGRAPHY

## 2017-06-16 ENCOUNTER — Encounter: Payer: Self-pay | Admitting: Internal Medicine

## 2017-09-01 ENCOUNTER — Ambulatory Visit: Payer: Managed Care, Other (non HMO)

## 2017-09-02 ENCOUNTER — Ambulatory Visit (INDEPENDENT_AMBULATORY_CARE_PROVIDER_SITE_OTHER): Payer: Managed Care, Other (non HMO) | Admitting: *Deleted

## 2017-09-02 DIAGNOSIS — Z23 Encounter for immunization: Secondary | ICD-10-CM

## 2017-10-01 ENCOUNTER — Other Ambulatory Visit: Payer: Self-pay | Admitting: Internal Medicine

## 2017-12-17 ENCOUNTER — Other Ambulatory Visit: Payer: Self-pay | Admitting: Pediatrics

## 2017-12-17 MED ORDER — OSELTAMIVIR PHOSPHATE 75 MG PO CAPS: 75.0000 mg | ORAL_CAPSULE | Freq: Every day | ORAL | 0 refills | Status: AC

## 2017-12-17 NOTE — Progress Notes (Signed)
Husband seen today for Flu A. Sent in ppx dosing of tamiflu.

## 2018-02-11 DIAGNOSIS — L708 Other acne: Secondary | ICD-10-CM | POA: Diagnosis not present

## 2018-02-11 DIAGNOSIS — D225 Melanocytic nevi of trunk: Secondary | ICD-10-CM | POA: Diagnosis not present

## 2018-03-03 NOTE — Progress Notes (Deleted)
No chief complaint on file.   HPI: Patient  Dana [name]  62 y.o. comes in today for Preventive Health Care visit   Health Maintenance  Topic Date Due  . Janet BerlinETANUS/TDAP  08/31/1973  . PAP SMEAR  04/21/2016  . HIV Screening  03/05/2018 (Originally 08/31/1971)  . INFLUENZA VACCINE  06/25/2018  . MAMMOGRAM  06/14/2019  . COLONOSCOPY  09/29/2023  . Hepatitis C Screening  Completed   Health Maintenance Review LIFESTYLE:  Exercise:   Tobacco/ETS: Alcohol:  Sugar beverages: Sleep: Drug use: no HH of  Work:    ROS:  GEN/ HEENT: No fever, significant weight changes sweats headaches vision problems hearing changes, CV/ PULM; No chest pain shortness of breath cough, syncope,edema  change in exercise tolerance. GI /GU: No adominal pain, vomiting, change in bowel habits. No blood in the stool. No significant GU symptoms. SKIN/HEME: ,no acute skin rashes suspicious lesions or bleeding. No lymphadenopathy, nodules, masses.  NEURO/ PSYCH:  No neurologic signs such as weakness numbness. No depression anxiety. IMM/ Allergy: No unusual infections.  Allergy .   REST of 12 system review negative except as per HPI   Past Medical History:  Diagnosis Date  . Abnormal finding on MRI of brain 01/10/2015  . CVA (cerebral infarction) 2010   diplopia neg acute MRi whit matter changes neg LP   . High blood pressure    readings  . High cholesterol   . Hx of varicella   . Migraine with aura    auras  . Non-cardiac chest pain    hops and evaluation 2011 dr Riley KillStuckey  . Other and unspecified hyperlipidemia 04/27/2012   Has been on meds for a number of years .  Low dose crestor .  Baseline reported as 280/   . PFO (patent foramen ovale)    Moderate size  . Pneumonia 2007   and flu hospitalized   . Stroke [facility])    on hrt double vision- possible blood clot on brain; [facility]   . Thyroid disease     Past Surgical History:  Procedure Laterality Date  . BREAST  SURGERY     implants  . COLONOSCOPY    . FOOT SURGERY     had bunyon removed  . TONSILLECTOMY      Family History  Adopted: Yes  Problem Relation Age of Onset  . Cancer Mother        pancreatic  . Alcohol abuse Mother   . Breast cancer Maternal Aunt     Social History   Socioeconomic History  . Marital status: Married    Spouse name: Not on file  . Number of children: 1  . Years of education: Not on file  . Highest education level: Not on file  Occupational History  . Not on file  Social Needs  . Financial resource strain: Not on file  . Food insecurity:    Worry: Not on file    Inability: Not on file  . Transportation needs:    Medical: Not on file    Non-medical: Not on file  Tobacco Use  . Smoking status: Never Smoker  . Smokeless tobacco: Never Used  Substance and Sexual Activity  . Alcohol use: No  . Drug use: No  . Sexual activity: Yes  Lifestyle  . Physical activity:    Days per week: Not on file    Minutes per session: Not on file  . Stress: Not on file  Relationships  .  Social connections:    Talks on phone: Not on file    Gets together: Not on file    Attends religious service: Not on file    Active member of club or organization: Not on file    Attends meetings of clubs or organizations: Not on file    Relationship status: Not on file  Other Topics Concern  . 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    Neg tad   Some caffiene    Seatbelts no ets fa stored    G1P1   Physically active     5 yo grandaughter    Patient is left handed.       Outpatient Medications Prior to Visit  Medication Sig Dispense Refill  . aspirin 81 MG tablet Take 81 mg by mouth daily.    Marland Kitchen atorvastatin (LIPITOR) 20 MG tablet TAKE 1 TABLET BY MOUTH EVERY DAY 90 tablet 3  .  citalopram (CELEXA) 10 MG tablet TAKE 1 TABLET (10 MG TOTAL) BY MOUTH DAILY. 30 tablet 1  . levothyroxine (SYNTHROID, LEVOTHROID) 75 MCG tablet TAKE 1 TABLET (75 MCG TOTAL) BY MOUTH DAILY. 90 tablet 3  . NONFORMULARY OR COMPOUNDED ITEM [facility]  Onychomycosis Nail Lacquer -  Fluconazole 2%, Terbinafine 1% DMSO Apply to affected nail once daily Qty. 120 gm 3 refills (Patient not taking: Reported on 05/06/2017) 1 each 0  . pantoprazole (PROTONIX) 40 MG tablet Take 1 tablet (40 mg total) by mouth daily. (Patient not taking: Reported on 05/06/2017) 90 tablet 0   No facility-administered medications prior to visit.      EXAM:  LMP 04/25/2009   There is no height or weight on file to calculate BMI. Wt Readings from Last 3 Encounters:  05/06/17 132 lb 3.2 oz (60 kg)  04/22/17 131 lb (59.4 kg)  03/05/17 131 lb 12.8 oz (59.8 kg)    Physical Exam: Vital signs reviewed ZOX:WRUE is a well-developed well-nourished alert cooperative    who appearsr stated age in no acute distress.  HEENT: normocephalic atraumatic , Eyes: PERRL EOM's full, conjunctiva clear, Nares: paten,t no deformity discharge or tenderness., Ears: no deformity EAC's clear TMs with normal landmarks. Mouth: clear OP, no lesions, edema.  Moist mucous membranes. Dentition in adequate repair. NECK: supple without masses, thyromegaly or bruits. CHEST/PULM:  Clear to auscultation and percussion breath sounds equal no wheeze , rales or rhonchi. No chest wall deformities or tenderness. Breast: normal by inspection . No dimpling, discharge, masses, tenderness or discharge . CV: PMI is nondisplaced, S1 S2 no gallops, murmurs, rubs. Peripheral pulses are full without delay.No JVD .  ABDOMEN: Bowel sounds normal nontender  No guard or rebound, no hepato splenomegal no CVA tenderness.  No hernia. Extremtities:  No clubbing cyanosis or edema, no acute joint swelling or redness no focal atrophy NEURO:  Oriented x3, cranial nerves 3-12  appear to be intact, no obvious focal weakness,gait within normal limits no abnormal reflexes or asymmetrical SKIN: No acute rashes normal turgor, color, no bruising or petechiae. PSYCH: Oriented, good eye contact, no obvious depression anxiety, cognition and judgment appear normal. LN: no cervical axillary inguinal adenopathy  Lab Results  Component Value Date   WBC  6.0 04/22/2017   HGB 13.3 04/22/2017   HCT 40.1 04/22/2017   PLT 224.0 04/22/2017   GLUCOSE 97 04/22/2017   CHOL 175 02/26/2017   TRIG 67.0 02/26/2017   HDL 78.80 02/26/2017   LDLCALC 83 02/26/2017   ALT 32 04/22/2017   AST 33 04/22/2017   NA 140 04/22/2017   K 4.1 04/22/2017   CL 105 04/22/2017   CREATININE 0.85 04/22/2017   BUN 11 04/22/2017   CO2 29 04/22/2017   TSH 3.25 02/26/2017   INR 0.97 03/01/2010    BP Readings from Last 3 Encounters:  05/06/17 120/80  04/22/17 (!) 164/86  03/05/17 130/80    Lab results reviewed with patient   ASSESSMENT AND PLAN:  Discussed the following assessment and plan:  Visit for preventive health examination  Hyperlipidemia, unspecified hyperlipidemia type  Alkaline phosphatase elevation  Hypothyroidism, unspecified type  Medication management  Patient Care Team: Madelin Headings, MD as PCP - General (Internal Medicine) Herby Abraham, MD (Cardiology) Olivia Mackie, MD as Attending Physician (Obstetrics and Gynecology) York Spaniel, MD as Consulting Physician (Neurology) There are no Patient Instructions on file for this visit.  Neta Mends. Cassandre Oleksy M.D.

## 2018-03-06 ENCOUNTER — Encounter: Payer: Managed Care, Other (non HMO) | Admitting: Internal Medicine

## 2018-03-09 ENCOUNTER — Ambulatory Visit: Payer: BLUE CROSS/BLUE SHIELD | Admitting: Internal Medicine

## 2018-03-09 ENCOUNTER — Ambulatory Visit (INDEPENDENT_AMBULATORY_CARE_PROVIDER_SITE_OTHER)
Admission: RE | Admit: 2018-03-09 | Discharge: 2018-03-09 | Disposition: A | Discharge status: Home / Self Care | Payer: BLUE CROSS/BLUE SHIELD | Source: Ambulatory Visit | Attending: Internal Medicine | Admitting: Internal Medicine

## 2018-03-09 ENCOUNTER — Encounter: Payer: Self-pay | Admitting: Internal Medicine

## 2018-03-09 VITALS — BP 128/88 | HR 86 | Temp 98.3°F | Wt 132.0 lb

## 2018-03-09 DIAGNOSIS — R079 Chest pain, unspecified: Secondary | ICD-10-CM | POA: Diagnosis not present

## 2018-03-09 DIAGNOSIS — M792 Neuralgia and neuritis, unspecified: Secondary | ICD-10-CM | POA: Diagnosis not present

## 2018-03-09 DIAGNOSIS — Z79899 Other long term (current) drug therapy: Secondary | ICD-10-CM | POA: Diagnosis not present

## 2018-03-09 DIAGNOSIS — Z0000 Encounter for general adult medical examination without abnormal findings: Secondary | ICD-10-CM

## 2018-03-09 DIAGNOSIS — R0789 Other chest pain: Secondary | ICD-10-CM | POA: Diagnosis not present

## 2018-03-09 MED ORDER — PREDNISONE 10 MG PO TABS: ORAL_TABLET | ORAL | 0 refills | Status: DC

## 2018-03-09 NOTE — Patient Instructions (Addendum)
Get an chest x ray   To check lungs  Etc  This acts like a superficial nerve pain .   Neuritis Pain   Without rash and no other issues on exam.    Prednisone  Taper.  Plan  resch  cpx and I lab ahead of time I willput in orders.

## 2018-03-09 NOTE — Progress Notes (Signed)
Chief Complaint  Patient presents with  . Shoulder Pain    Pain in right shoulder blade, burning sensation and skin tenderness to the entire shoulder blade. NO visible rash in the area. Pt has taken pain medication to try and get relief but nothing touches the pain. Symptoms started about 2 weeks ago.     HPI: ANAVI BRANSCUM 62 y.o.  SDA   See above sx   Never had before   Used  Heat ice tylenol and  Husbands  Muscle spasm medicine.    No help skin very sensitive and burning pain and hard to sleep . No rash   No falling   Or injury . Cant take aleve and ibuprofen  cause of "killing stomach"      Family asking to be seen.   No hx of same.  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  Feels like shingles  Husband   Had    Emergency  Carotid  Surgery    And had to cancel her cpx  But had been ok   No spine tenderness or fever.  .  ROS: See pertinent positives and negatives per HPI. No fever cough   Past Medical History:  Diagnosis Date  . Abnormal finding on MRI of brain 01/10/2015  . CVA (cerebral infarction) 2010   diplopia neg acute MRi whit matter changes neg LP   . High blood pressure    readings  . High cholesterol   . Hx of varicella   . Migraine with aura    auras  . Non-cardiac chest pain    hops and evaluation 2011 dr Riley Kill  . Other and unspecified hyperlipidemia 04/27/2012   Has been on meds for a number of years .  Low dose crestor .  Baseline reported as 280/   . PFO (patent foramen ovale)    Moderate size  . Pneumonia 2007   and flu hospitalized   . Stroke [facility])    on hrt double vision- possible blood clot on brain; Dr. Lesia Sago at [facility]   . Thyroid disease     Family History  Adopted: Yes  Problem Relation Age of Onset  . Cancer Mother        pancreatic  . Alcohol abuse Mother   . Breast cancer Maternal Aunt     Social History   Socioeconomic History  . Marital status: Married   Spouse name: Not on file  . Number of children: 1  . Years of education: Not on file  . Highest education level: Not on file  Occupational History  . Not on file  Social Needs  . Financial resource strain: Not on file  . Food insecurity:    Worry: Not on file    Inability: Not on file  . Transportation needs:    Medical: Not on file    Non-medical: Not on file  Tobacco Use  . Smoking status: Never Smoker  . Smokeless tobacco: Never Used  Substance and Sexual Activity  . Alcohol use: No  . Drug use: No  . Sexual activity: Yes  Lifestyle  . Physical activity:    Days per week: Not on file    Minutes per session: Not on file  . Stress: Not on file  Relationships  . Social connections:    Talks on phone: Not on file    Gets together: Not on file  Attends religious service: Not on file    Active member of club or organization: Not on file    Attends meetings of clubs or organizations: Not on file    Relationship status: Not on file  Other Topics Concern  . 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    Neg tad   Some caffiene    Seatbelts no ets fa stored    G1P1   Physically active     5 yo grandaughter    Patient is left handed.       Outpatient Medications Prior to Visit  Medication Sig Dispense Refill  . aspirin 81 MG tablet Take 81 mg by mouth daily.    Marland Kitchen. atorvastatin (LIPITOR) 20 MG tablet TAKE 1 TABLET BY MOUTH EVERY DAY 90 tablet 3  . levothyroxine (SYNTHROID, LEVOTHROID) 75 MCG tablet TAKE 1 TABLET (75 MCG TOTAL) BY MOUTH DAILY. 90 tablet 3  . citalopram (CELEXA) 10 MG tablet TAKE 1 TABLET (10 MG TOTAL) BY MOUTH DAILY. (Patient not taking: Reported on 03/09/2018) 30 tablet 1  . NONFORMULARY OR COMPOUNDED ITEM [facility]  Onychomycosis Nail Lacquer -    Fluconazole 2%, Terbinafine 1% DMSO Apply to affected nail once daily Qty. 120 gm 3 refills (Patient not taking: Reported on 03/09/2018) 1 each 0  . pantoprazole (PROTONIX) 40 MG tablet Take 1 tablet (40 mg total) by mouth daily. (Patient not taking: Reported on 03/09/2018) 90 tablet 0   No facility-administered medications prior to visit.      EXAM:  BP 128/88 (BP Location: Right Arm, Patient Position: Sitting, Cuff Size: Normal)   Pulse 86   Temp 98.3 F (36.8 C) (Oral)   Wt 132 lb (59.9 kg)   LMP 04/25/2009   BMI 21.97 kg/m   Body mass index is 21.97 kg/m.  GENERAL: vitals reviewed and listed above, alert, oriented, appears well hydrated and in no acute distress HEENT: atraumatic, conjunctiva  clear, no obvious abnormalities on inspection of external nose and ears  NECK: no obvious masses on inspection palpation  No adenopathy  LUNGS: clear to auscultation bilaterally, no wheezes, rales or rhonchi, good air movement Skin  Over tight periscapualr area  No rash no point  Bony tenderness  Says radaited to latera bra area  CV: HRRR, no clubbing cyanosis or  peripheral edema nl cap refill  MS: moves all extremities without noticeable focal  Abnormality shoulder nl rom  And no joint swelling PSYCH: pleasant and cooperative, no obvious depression or anxiety Lab Results  Component Value Date   WBC 6.0 04/22/2017   HGB 13.3 04/22/2017   HCT 40.1 04/22/2017   PLT 224.0 04/22/2017   GLUCOSE 97 04/22/2017   CHOL 175 02/26/2017   TRIG 67.0 02/26/2017   HDL 78.80 02/26/2017   LDLCALC 83 02/26/2017   ALT 32 04/22/2017   AST 33 04/22/2017   NA 140 04/22/2017   K 4.1 04/22/2017   CL 105 04/22/2017   CREATININE 0.85 04/22/2017   BUN 11 04/22/2017   CO2 29 04/22/2017   TSH 3.25 02/26/2017   INR 0.97 03/01/2010    ASSESSMENT AND PLAN:  Discussed the following assessment and plan:  Chest pain, unspecified type -  Plan: DG Chest 2 View, Basic metabolic panel, CBC with  Differential/Platelet, Hepatic function panel, Lipid panel, TSH, Sedimentation rate  Neuritis - Plan: Basic metabolic panel, CBC with Differential/Platelet, Hepatic function panel, Lipid panel, TSH, Sedimentation rate  Visit for preventive health examination - Plan: Basic metabolic panel, CBC with Differential/Platelet, Hepatic function panel, Lipid panel, TSH  Medication management - Plan: Basic metabolic panel, CBC with Differential/Platelet, Hepatic function panel, Lipid panel, TSH, Sedimentation rate Orders placed for  cpx   And monitoring .   X ray today pred taper and call for other advice ?  If trial gabapentin etc  -Patient advised to return or notify health care team  if symptoms worsen ,persist or new concerns arise.  Patient Instructions  Get an chest x ray   To check lungs  Etc  This acts like a superficial nerve pain .   Neuritis Pain   Without rash and no other issues on exam.    Prednisone  Taper.  Plan  resch  cpx and I lab ahead of time I willput in orders.       Neta Mends. Myeasha Ballowe M.D.

## 2018-03-10 ENCOUNTER — Ambulatory Visit: Payer: Self-pay | Admitting: *Deleted

## 2018-03-10 ENCOUNTER — Encounter: Payer: Self-pay | Admitting: Internal Medicine

## 2018-03-10 NOTE — Telephone Encounter (Signed)
I returned her call.   She called in c/o having a pain in her right shoulder last night that radiated into her right neck and jaw.   She denies any other symptoms with it and it resolved on its own after a few seconds.   "I think it just scared me more than any thing else".   "I just wanted to let Dr. Fabian SharpPanosh aware".  She was seen by Dr. Fabian SharpPanosh yesterday for right shoulder pain and extreme skin sensitivity over her upper shoulder and back area.   She was prescribed steroids which she will start this morning.  She is coming in tomorrow morning for fasting labs and has a follow up appt with Dr. Fabian SharpPanosh next week, per pt.  I instructed her to go to the the ED or call  911 if she starts experiencing chest pain, shortness of breath, breaking out in a sweat, nausea, extreme fatigue, or pain radiating down either arm, neck or jaw.   She verbalized understanding and was agreeable to this plan.  I routed a note to Dr Fabian SharpPanosh making her aware of the situation.   Reason for Disposition . Shoulder pain is a chronic symptom (recurrent or ongoing AND present > 4 weeks)    She was seen yesterday by Dr. Fabian SharpPanosh for right shoulder pain.   Prescribed steroids.   Will start them this morning.   Had episode once where she had a pain that radiated from her right shoulder into her right neck and jaw.   No other symptoms associated with this pain.   It only lasted a few seconds and resolved.  Answer Assessment - Initial Assessment Questions 1. ONSET: "When did the pain start?"     Pain right shoulder blade for 2-3 weeks.   Saw Dr. Fabian SharpPanosh yesterday for nerve pain.   Steroid started. 2. LOCATION: "Where is the pain located?"     Pain started in right side of neck, up into neck from shoulder and into the jaw.    Skin on my back and shoulder area is so sensitive.  Started steroids. 3. PAIN: "How bad is the pain?" (Scale 1-10; or mild, moderate, severe)   - MILD (1-3): doesn't interfere with normal activities   - MODERATE  (4-7): interferes with normal activities (e.g., work or school) or awakens from sleep   - SEVERE (8-10): excruciating pain, unable to do any normal activities, unable to move arm at all due to pain     No difficult of movement. 4. WORK OR EXERCISE: "Has there been any recent work or exercise that involved this part of the body?"     No 5. CAUSE: "What do you think is causing the shoulder pain?"     I don't know.   It just scared me.   6. OTHER SYMPTOMS: "Do you have any other symptoms?" (e.g., neck pain, swelling, rash, fever, numbness, weakness)     No 7. PREGNANCY: "Is there any chance you are pregnant?" "When was your last menstrual period?"     Not asked due to age  Protocols used: SHOULDER PAIN-A-AH

## 2018-03-10 NOTE — Telephone Encounter (Signed)
Please advise Dr Panosh, thanks.   

## 2018-03-10 NOTE — Telephone Encounter (Signed)
I agree  With pan of watching  Still seems like nerver pain and not vascular    Get on the  Prednisone  As planned

## 2018-03-11 ENCOUNTER — Other Ambulatory Visit (INDEPENDENT_AMBULATORY_CARE_PROVIDER_SITE_OTHER): Payer: BLUE CROSS/BLUE SHIELD

## 2018-03-11 DIAGNOSIS — Z0000 Encounter for general adult medical examination without abnormal findings: Secondary | ICD-10-CM | POA: Diagnosis not present

## 2018-03-11 DIAGNOSIS — Z79899 Other long term (current) drug therapy: Secondary | ICD-10-CM | POA: Diagnosis not present

## 2018-03-11 DIAGNOSIS — R079 Chest pain, unspecified: Secondary | ICD-10-CM

## 2018-03-11 DIAGNOSIS — M792 Neuralgia and neuritis, unspecified: Secondary | ICD-10-CM

## 2018-03-11 LAB — LIPID PANEL
Cholesterol: 168 mg/dL (ref 0–200)
HDL: 85.2 mg/dL (ref >39.00)
LDL Cholesterol: 76 mg/dL (ref 0–99)
NONHDL: 83.15
Total CHOL/HDL Ratio: 2
Triglycerides: 37 mg/dL (ref 0.0–149.0)
VLDL: 7.4 mg/dL (ref 0.0–40.0)

## 2018-03-11 LAB — BASIC METABOLIC PANEL
BUN: 14 mg/dL (ref 6–23)
CALCIUM: 9.9 mg/dL (ref 8.4–10.5)
CO2: 26 meq/L (ref 19–32)
Chloride: 102 mEq/L (ref 96–112)
Creatinine, Ser: 0.86 mg/dL (ref 0.40–1.20)
GFR: 70.71 mL/min (ref >60.00)
Glucose, Bld: 90 mg/dL (ref 70–99)
POTASSIUM: 4.3 meq/L (ref 3.5–5.1)
SODIUM: 137 meq/L (ref 135–145)

## 2018-03-11 LAB — HEPATIC FUNCTION PANEL
ALBUMIN: 4.2 g/dL (ref 3.5–5.2)
ALK PHOS: 152 U/L — AB (ref 39–117)
ALT: 31 U/L (ref 0–35)
AST: 26 U/L (ref 0–37)
Bilirubin, Direct: 0.1 mg/dL (ref 0.0–0.3)
TOTAL PROTEIN: 7.3 g/dL (ref 6.0–8.3)
Total Bilirubin: 0.5 mg/dL (ref 0.2–1.2)

## 2018-03-11 LAB — CBC WITH DIFFERENTIAL/PLATELET
BASOS ABS: 0.1 10*3/uL (ref 0.0–0.1)
Basophils Relative: 0.9 % (ref 0.0–3.0)
Eosinophils Absolute: 0 10*3/uL (ref 0.0–0.7)
Eosinophils Relative: 0 % (ref 0.0–5.0)
HEMATOCRIT: 39.8 % (ref 36.0–46.0)
Hemoglobin: 13.4 g/dL (ref 12.0–15.0)
LYMPHS PCT: 10.8 % — AB (ref 12.0–46.0)
Lymphs Abs: 1.1 10*3/uL (ref 0.7–4.0)
MCHC: 33.6 g/dL (ref 30.0–36.0)
MCV: 90.9 fl (ref 78.0–100.0)
MONOS PCT: 7 % (ref 3.0–12.0)
Monocytes Absolute: 0.7 10*3/uL (ref 0.1–1.0)
NEUTROS ABS: 8.5 10*3/uL — AB (ref 1.4–7.7)
Neutrophils Relative %: 81.3 % — ABNORMAL HIGH (ref 43.0–77.0)
Platelets: 275 10*3/uL (ref 150.0–400.0)
RBC: 4.38 Mil/uL (ref 3.87–5.11)
RDW: 13.8 % (ref 11.5–15.5)
WBC: 10.4 10*3/uL (ref 4.0–10.5)

## 2018-03-11 LAB — TSH: TSH: 0.9 u[IU]/mL (ref 0.35–4.50)

## 2018-03-11 LAB — SEDIMENTATION RATE: Sed Rate: 54 mm/hr — ABNORMAL HIGH (ref 0–30)

## 2018-03-11 NOTE — Telephone Encounter (Signed)
Plaque build up seen on x ray    An incidnetal finding   May consider going on  Cholesterol medicine . We cn discuss at nextOV  cpx

## 2018-03-11 NOTE — Telephone Encounter (Signed)
Pt notified via mychart

## 2018-03-11 NOTE — Telephone Encounter (Signed)
I called the pt and informed her of the message below.  Patient denies any shortness of breath, nausea or vomiting or any other symptoms.  Stated she has an appt on 4/22.

## 2018-03-11 NOTE — Telephone Encounter (Signed)
Based on this message continue the  Prednisone  But please call her to get more  Clinical information    She can make a fu OV  next week   Even if not  Time for her cpx

## 2018-03-12 NOTE — Telephone Encounter (Signed)
Patient Notified that results would be discussed at next OV. Called Patient to reschedule 03/16/18 OV for a 30 minute physical on Friday April 26th if possible. Patient stated she would call back after checking her calender.

## 2018-03-16 ENCOUNTER — Telehealth: Payer: Self-pay | Admitting: Family Medicine

## 2018-03-16 ENCOUNTER — Ambulatory Visit: Payer: BLUE CROSS/BLUE SHIELD | Admitting: Internal Medicine

## 2018-03-16 ENCOUNTER — Encounter: Payer: Self-pay | Admitting: Internal Medicine

## 2018-03-16 VITALS — BP 126/78 | HR 59 | Temp 98.1°F | Wt 131.0 lb

## 2018-03-16 DIAGNOSIS — R748 Abnormal levels of other serum enzymes: Secondary | ICD-10-CM | POA: Diagnosis not present

## 2018-03-16 DIAGNOSIS — E039 Hypothyroidism, unspecified: Secondary | ICD-10-CM | POA: Diagnosis not present

## 2018-03-16 DIAGNOSIS — Z79899 Other long term (current) drug therapy: Secondary | ICD-10-CM

## 2018-03-16 DIAGNOSIS — M792 Neuralgia and neuritis, unspecified: Secondary | ICD-10-CM | POA: Diagnosis not present

## 2018-03-16 DIAGNOSIS — M898X1 Other specified disorders of bone, shoulder: Secondary | ICD-10-CM | POA: Diagnosis not present

## 2018-03-16 DIAGNOSIS — T887XXA Unspecified adverse effect of drug or medicament, initial encounter: Secondary | ICD-10-CM | POA: Diagnosis not present

## 2018-03-16 DIAGNOSIS — E785 Hyperlipidemia, unspecified: Secondary | ICD-10-CM

## 2018-03-16 MED ORDER — TRAMADOL HCL 50 MG PO TABS: 50.0000 mg | ORAL_TABLET | Freq: Three times a day (TID) | ORAL | 0 refills | Status: DC | PRN

## 2018-03-16 MED ORDER — GABAPENTIN 100 MG PO CAPS: ORAL_CAPSULE | ORAL | 1 refills | Status: DC

## 2018-03-16 MED ORDER — ATORVASTATIN CALCIUM 20 MG PO TABS: 20.0000 mg | ORAL_TABLET | Freq: Every day | ORAL | 3 refills | Status: DC

## 2018-03-16 MED ORDER — LEVOTHYROXINE SODIUM 75 MCG PO TABS: 75.0000 ug | ORAL_TABLET | Freq: Every day | ORAL | 3 refills | Status: DC

## 2018-03-16 NOTE — Telephone Encounter (Signed)
[facility] regarding the referral for Dr Katrinka BlazingSmith. She said that this in an urgent referral from Dr Fabian SharpPanosh and would like to get her in as soon as possible.

## 2018-03-16 NOTE — Patient Instructions (Signed)
Can take tramadol for pain at night to sleep  But also gabapentin   To help with nerve pain .   You will be contacted about consult with  Specialist .  No change in  Other meds

## 2018-03-16 NOTE — Telephone Encounter (Signed)
Called pt, scheduled appt 4.25.19 @ 1030.

## 2018-03-16 NOTE — Progress Notes (Signed)
Chief Complaint  Patient presents with  . Follow-up    Review labs / Prednisone did not work for shoulder pain - in severe pain / Needs refills of  Lipitor and Synthroid    HPI: Dana [name] 62 y.o. come in for Chronic disease management   But fu of  Pain right scalpular area .   unrenting   No assoc sx execpt a short lived radiation to right neck  Jaw area   Pain always there and pred no help  Just making her jittery irritable and wants to stop the medication . Pain the same  Level 4- 10 at any time not related to motion or   Other assoic hard to sleep cause of this   Need refill atorvastatin and thyroid.   cxray was normal  ROS: See pertinent positives and negatives per HPI. No weakness numbness mid pain tenderness cough    Past Medical History:  Diagnosis Date  . Abnormal finding on MRI of brain 01/10/2015  . CVA (cerebral infarction) 2010   diplopia neg acute MRi whit matter changes neg LP   . High blood pressure    readings  . High cholesterol   . Hx of varicella   . Migraine with aura    auras  . Non-cardiac chest pain    hops and evaluation 2011 dr Lia Foyer  . Other and unspecified hyperlipidemia 04/27/2012   Has been on meds for a number of years .  Low dose crestor .  Baseline reported as 280/   . PFO (patent foramen ovale)    Moderate size  . Pneumonia 2007   and flu hospitalized   . Stroke [facility])    on hrt double vision- possible blood clot on brain; Dr. Floyde Parkins at [facility]   . Thyroid disease     Family History  Adopted: Yes  Problem Relation Age of Onset  . Cancer Mother        pancreatic  . Alcohol abuse Mother   . Breast cancer Maternal Aunt     Social History   Socioeconomic History  . Marital status: Married    Spouse name: Not on file  . Number of children: 1  . Years of education: Not on file  . Highest education level: Not on file  Occupational History  . Not on file  Social Needs  . Financial resource strain: Not on  file  . Food insecurity:    Worry: Not on file    Inability: Not on file  . Transportation needs:    Medical: Not on file    Non-medical: Not on file  Tobacco Use  . Smoking status: Never Smoker  . Smokeless tobacco: Never Used  Substance and Sexual Activity  . Alcohol use: No  . Drug use: No  . Sexual activity: Yes  Lifestyle  . Physical activity:    Days per week: Not on file    Minutes per session: Not on file  . Stress: Not on file  Relationships  . Social connections:    Talks on phone: Not on file    Gets together: Not on file    Attends religious service: Not on file    Active member of club or organization: Not on file    Attends meetings of clubs or organizations: Not on file    Relationship status: Not on file  Other Topics Concern  . 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    Neg tad   Some caffiene    Seatbelts no ets fa stored    G1P1   Physically active     60 yo grandaughter    Patient is left handed.       Outpatient Medications Prior to Visit  Medication Sig Dispense Refill  . aspirin 81 MG tablet Take 81 mg by mouth daily.    Marland Kitchen atorvastatin (LIPITOR) 20 MG tablet TAKE 1 TABLET BY MOUTH EVERY DAY 90 tablet 3  . levothyroxine (SYNTHROID, LEVOTHROID) 75 MCG tablet TAKE 1 TABLET (75 MCG TOTAL) BY MOUTH DAILY. 90 tablet 3  . predniSONE (DELTASONE) 10 MG tablet Take pills per day,6,6,6,4,4,4,2,2,2,1,1,1 40 tablet 0   No facility-administered medications prior to visit.      EXAM:  BP 126/78 (BP Location: Right Arm, Patient Position: Sitting, Cuff Size: Normal)   Pulse (!) 59   Temp 98.1 F (36.7 C) (Oral)   Wt 131 lb (59.4 kg)   LMP 04/25/2009   BMI 21.80 kg/m   Body mass index is 21.8 kg/m.  GENERAL: vitals reviewed and listed above, alert,  oriented, appears well hydrated and in no acute distress tired appearing nl motion   HEENT: atraumatic, conjunctiva  clear, no obvious abnormalities on inspection of external nose and ears   NECK: no obvious masses on inspection palpation  LUNGS: clear to auscultation bilaterally, no wheezes, rales or rhonchi,  CV: HRRR, no clubbing cyanosis or  peripheral edema nl cap refill   No midline spine tenderness   No point tenderness back but ? Near inferior scalpua .  No point rib tenderness   Area to lateral rib  MS: moves all extremities without noticeable focal  abnormality PSYCH: pleasant and cooperative, tired appearing  Lab Results  Component Value Date   WBC 10.4 03/11/2018   HGB 13.4 03/11/2018   HCT 39.8 03/11/2018   PLT 275.0 03/11/2018   GLUCOSE 90 03/11/2018   CHOL 168 03/11/2018   TRIG 37.0 03/11/2018   HDL 85.20 03/11/2018   LDLCALC 76 03/11/2018   ALT 31 03/11/2018   AST 26 03/11/2018   NA 137 03/11/2018   K 4.3 03/11/2018   CL 102 03/11/2018   CREATININE 0.86 03/11/2018   BUN 14 03/11/2018   CO2 26 03/11/2018   TSH 0.90 03/11/2018   INR 0.97 03/01/2010   BP Readings from Last 3 Encounters:  03/16/18 126/78  03/09/18 128/88  05/06/17 120/80   reviewed x ray and labs  ASSESSMENT AND PLAN:  Discussed the following assessment and plan:  Neuritis - Plan: Ambulatory referral to Sports Medicine  Pain of right scapula - pain unlrelenting and worse?    cause  hard to delineate   trail pain med gaba and referral - Plan: Ambulatory referral to Sports Medicine  Medication management  Alkaline phosphatase elevation - will need to do record reviwe and plan for fu   has had neg eval in past imaging and labs   Hyperlipidemia, unspecified hyperlipidemia type  Hypothyroidism, unspecified type  Medication side effect - oral pred  Uncertain cause of pain  But is neuro pain in nature   Without obv cause .   X ray ok  Dose have some plaque  But not a vascular  Sounding   Problem  Had  Neg se of prednisone so stop  And use tramadol ( had nausea with codeine)  And trial of gabapentin and  Referral to evaluation   Continue atorva and thyroid today   Alk phos up again  Recently will plan record review and fu as appropriate.  -Patient advised to return or notify health care team  if  new concerns arise.  Patient Instructions  Can take tramadol for pain at night to sleep  But also gabapentin   To help with nerve pain .   You will be contacted about consult with  Specialist .  No change in  Other meds   Standley Brooking. Domanique Luckett M.D.

## 2018-03-17 ENCOUNTER — Encounter: Payer: Self-pay | Admitting: Internal Medicine

## 2018-03-17 DIAGNOSIS — R748 Abnormal levels of other serum enzymes: Secondary | ICD-10-CM

## 2018-03-18 NOTE — Telephone Encounter (Signed)
Esr  Elevation  is a non specific finding   of inflammation but it is not  Very elevated   And diagnose    A disease     If very high  100 and above we sometimes look for autoimmune  causes   But  Yours is not that high . ( a cold of flu could temporarily raise  To this level)   I dont think this is related  To you pain  But  but we still need to repeat  Follow up the  alkaline phosphatase cause it went back up . Again   I will put in orders and please get repeat blood tests in 1 month if all the same  dont have to fast

## 2018-03-18 NOTE — Telephone Encounter (Signed)
MyChart message sent to patient notifying she will be contacted once PCP reviews labs.  Please advise when you have a chance. Thanks!

## 2018-03-19 ENCOUNTER — Encounter: Payer: Self-pay | Admitting: Family Medicine

## 2018-03-19 ENCOUNTER — Ambulatory Visit: Payer: BLUE CROSS/BLUE SHIELD | Admitting: Family Medicine

## 2018-03-19 DIAGNOSIS — M999 Biomechanical lesion, unspecified: Secondary | ICD-10-CM

## 2018-03-19 DIAGNOSIS — M940 Chondrocostal junction syndrome [Tietze]: Secondary | ICD-10-CM

## 2018-03-19 NOTE — Progress Notes (Signed)
[facility] [address] [place], [place] [address] Phone: [phone] Subjective:    I'm seeing this patient by the request  of:    CC: Right back pain  [id number]  Dana [name] is a 62 y.o. female coming in with complaint of right scapula pain that radiates to her right breast. Patient describes pain as burning and constant. Patient did have one incident where the pain went up into her neck and jaw. Nothing alleviates her pain. Has tried Tylenol and stretching. Gabapentin does help decrease her pain.  Does not remember any true injury.  Denies any radiation down the arm.  Patient states though that it is unfortunately continuous.  Denies any type of rash.  Rates the severity of pain is 7 out of 10.      Past Medical History:  Diagnosis Date  . Abnormal finding on MRI of brain 01/10/2015  . CVA (cerebral infarction) 2010   diplopia neg acute MRi whit matter changes neg LP   . High blood pressure    readings  . High cholesterol   . Hx of varicella   . Migraine with aura    auras  . Non-cardiac chest pain    hops and evaluation 2011 dr Riley Kill  . Other and unspecified hyperlipidemia 04/27/2012   Has been on meds for a number of years .  Low dose crestor .  Baseline reported as 280/   . PFO (patent foramen ovale)    Moderate size  . Pneumonia 2007   and flu hospitalized   . Stroke [facility])    on hrt double vision- possible blood clot on brain; Dr. Lesia Sago at [facility]   . Thyroid disease    Past Surgical History:  Procedure Laterality Date  . BREAST SURGERY     implants  . COLONOSCOPY    . FOOT SURGERY     had bunyon removed  . TONSILLECTOMY     Social History   Socioeconomic History  . Marital status: Married    Spouse name: Not on file  . Number of children: 1  . Years of education: Not on file  . Highest education level: Not on file  Occupational History  . Not on file  Social Needs  . Financial resource strain:  Not on file  . Food insecurity:    Worry: Not on file    Inability: Not on file  . Transportation needs:    Medical: Not on file    Non-medical: Not on file  Tobacco Use  . Smoking status: Never Smoker  . Smokeless tobacco: Never Used  Substance and Sexual Activity  . Alcohol use: No  . Drug use: No  . Sexual activity: Yes  Lifestyle  . Physical activity:    Days per week: Not on file    Minutes per session: Not on file  . Stress: Not on file  Relationships  . Social connections:    Talks on phone: Not on file    Gets together: Not on file    Attends religious service: Not on file    Active member of club or organization: Not on file    Attends meetings of clubs or organizations: Not on file    Relationship status: Not on file  Other Topics Concern  . 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    Neg tad   Some caffiene    Seatbelts no ets fa stored    G1P1   Physically active     5 yo grandaughter    Patient is left handed.      Allergies  Allergen Reactions  . Estrogens Other (See Comments)    Had stroke on HRT/OCP  . Prednisone Other (See Comments)    Jittery and irritable hard to sleep with oral pred  . Codeine     REACTION: nausea  . Ibuprofen     REACTION: nause   Family History  Adopted: Yes  Problem Relation Age of Onset  . Cancer Mother        pancreatic  . Alcohol abuse Mother   . Breast cancer Maternal Aunt      Past medical history, social, surgical and family history all reviewed in electronic medical record.  No pertanent information unless stated regarding to the chief complaint.   Review of Systems:Review of systems updated and as accurate as of 03/19/18  No headache, visual changes, nausea, vomiting, diarrhea, constipation, dizziness, abdominal  pain, skin rash, fevers, chills, night sweats, weight loss, swollen lymph nodes, body aches, joint swelling,  chest pain, shortness of breath, mood changes.  Positive muscle aches  Objective  Last menstrual period 04/25/2009. Systems examined below as of 03/19/18   General: No apparent distress alert and oriented x3 mood and affect normal, dressed appropriately.  HEENT: Pupils equal, extraocular movements intact  Respiratory: Patient's speak in full sentences and does not appear short of breath  Cardiovascular: No lower extremity edema, non tender, no erythema  Skin: Warm dry intact with no signs of infection or rash on extremities or on axial skeleton.  Abdomen: Soft nontender  Neuro: Cranial nerves II through XII are intact, neurovascularly intact in all extremities with 2+ DTRs and 2+ pulses.  Lymph: No lymphadenopathy of posterior or anterior cervical chain or axillae bilaterally.  Gait normal with good balance and coordination.  MSK:  Non tender with full range of motion and good stability and symmetric strength and tone of shoulders, elbows, wrist, hip, knee and ankles bilaterally.  Neck exam shows good range of motion but does have some mild limited right-sided rotation.  Right-sided scapular does show some inferior dyskinesis.  Patient does have some trigger points noted.  Slipped rib noted on the right side.  Patient is able to take a deep breath.  Shoulder exam shows full range of motion with no signs of impingement.  Negative Spurling's of the neck  Osteopathic findings C2 flexed rotated and side bent right T3 extended rotated and side bent right inhaled third rib T5 extended rotated and side bent left L3 flexed rotated and side bent right       Impression and Recommendations:     This case required medical decision making of moderate complexity.      Note: This dictation was prepared with Dragon dictation along with smaller phrase technology. Any transcriptional  errors that result from this process are unintentional.

## 2018-03-19 NOTE — Assessment & Plan Note (Signed)
Decision today to treat with OMT was based on Physical Exam  After verbal consent patient was treated with HVLA, ME, FPR techniques in cervical, thoracic, rib areas  Patient tolerated the procedure well with improvement in symptoms  Patient given exercises, stretches and lifestyle modifications  See medications in patient instructions if given  Patient will follow up in 3-4 weeks 

## 2018-03-19 NOTE — Assessment & Plan Note (Signed)
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  Attempted osteopathic manipulation with some good resolution of pain.  Not completely.  Encourage patient to gabapentin 200 mg at night regularly for the next week.  Given topical anti-inflammatories to try.  Differential includes an early shingles but states that there is no rash.  I did not see one either.  Patient also does not seem to be in pain to light palpation that would likely contribute to this.  Patient has had elevated LFTs in the past but no abdominal pain on exam.  Patient is not short of breath and I do not feel that further imaging is necessary.  No cardiac symptoms that is showing any type of stable or unstable angina.  Patient responded well to conservative therapy and given home exercises and following up again in 4 weeks

## 2018-03-19 NOTE — Patient Instructions (Signed)
Good to see you  Ice 20 minutes 2 times daily. Usually after activity and before bed. pennsaid pinkie amount topically 2 times daily as needed.  Over the counter get  Vitamin D 2000 Iu daily  Turmeric 500mg  daily  Tart cherry extract any dose at night Exercises 3 times a week.  Keep hands within peripheral vision  See me again in 3-4 weeks

## 2018-03-23 ENCOUNTER — Encounter: Payer: Self-pay | Admitting: Internal Medicine

## 2018-03-23 NOTE — Telephone Encounter (Signed)
Please advise Dr Panosh, thanks.   

## 2018-03-23 NOTE — Progress Notes (Signed)
[facility] [address] [place], [place] [address] Phone: [phone] Subjective:     CC: Right-sided thoracic pain  [id number]  Dana [name] is a 62 y.o. female coming in with complaint of right-sided pain.  Found to have a right-sided scapular dyskinesis and slipped rib syndrome.  Attempted osteopathic manipulation with very minimal benefit.  Continue to have pain that seems to wrap around the chest and go to the chest wall.  On the right side.  No associated shortness of breath.  States that the pain is severe enough that it is stopping her from daily activities.  Patient has had a chest x-ray did not show any significant findings.  This was independently visualized by me.    Past Medical History:  Diagnosis Date  . Abnormal finding on MRI of brain 01/10/2015  . CVA (cerebral infarction) 2010   diplopia neg acute MRi whit matter changes neg LP   . High blood pressure    readings  . High cholesterol   . Hx of varicella   . Migraine with aura    auras  . Non-cardiac chest pain    hops and evaluation 2011 dr Riley Kill  . Other and unspecified hyperlipidemia 04/27/2012   Has been on meds for a number of years .  Low dose crestor .  Baseline reported as 280/   . PFO (patent foramen ovale)    Moderate size  . Pneumonia 2007   and flu hospitalized   . Stroke [facility])    on hrt double vision- possible blood clot on brain; Dr. Lesia Sago at [facility]   . Thyroid disease    Past Surgical History:  Procedure Laterality Date  . BREAST SURGERY     implants  . COLONOSCOPY    . FOOT SURGERY     had bunyon removed  . TONSILLECTOMY     Social History   Socioeconomic History  . Marital status: Married    Spouse name: Not on file  . Number of children: 1  . Years of education: Not on file  . Highest education level: Not on file  Occupational History  . Not on file  Social Needs  . Financial resource strain: Not on file  . Food  insecurity:    Worry: Not on file    Inability: Not on file  . Transportation needs:    Medical: Not on file    Non-medical: Not on file  Tobacco Use  . Smoking status: Never Smoker  . Smokeless tobacco: Never Used  Substance and Sexual Activity  . Alcohol use: No  . Drug use: No  . Sexual activity: Yes  Lifestyle  . Physical activity:    Days per week: Not on file    Minutes per session: Not on file  . Stress: Not on file  Relationships  . Social connections:    Talks on phone: Not on file    Gets together: Not on file    Attends religious service: Not on file    Active member of club or organization: Not on file    Attends meetings of clubs or organizations: Not on file    Relationship status: Not on file  Other Topics Concern  . 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    Neg tad   Some caffiene    Seatbelts no ets fa stored    G1P1   Physically active     5 yo grandaughter    Patient is left handed.      Allergies  Allergen Reactions  . Estrogens Other (See Comments)    Had stroke on HRT/OCP  . Prednisone Other (See Comments)    Jittery and irritable hard to sleep with oral pred  . Codeine     REACTION: nausea  . Ibuprofen     REACTION: nause   Family History  Adopted: Yes  Problem Relation Age of Onset  . Cancer Mother        pancreatic  . Alcohol abuse Mother   . Breast cancer Maternal Aunt      Past medical history, social, surgical and family history all reviewed in electronic medical record.  No pertanent information unless stated regarding to the chief complaint.   Review of Systems:Review of systems updated and as accurate as of 03/24/18  No headache, visual changes, nausea, vomiting, diarrhea, constipation, dizziness, abdominal pain, skin rash,  fevers, chills, night sweats, weight loss, swollen lymph nodes, body aches, joint swelling, chest pain, shortness of breath, mood changes.  Positive muscle aches  Objective  Blood pressure (!) 150/78, pulse 85, height  (1.651 m), weight 134 lb (60.8 kg), last menstrual period 04/25/2009, SpO2 98 %. Systems examined below as of 03/24/18   General: No apparent distress alert and oriented x3 mood and affect normal, dressed appropriately.  HEENT: Pupils equal, extraocular movements intact  Respiratory: Patient's speak in full sentences and does not appear short of breath  Cardiovascular: No lower extremity edema, non tender, no erythema  Skin: Warm dry intact with no signs of infection or rash on extremities or on axial skeleton.  Abdomen: Soft nontender  Neuro: Cranial nerves II through XII are intact, neurovascularly intact in all extremities with 2+ DTRs and 2+ pulses.  Lymph: No lymphadenopathy of posterior or anterior cervical chain or axillae bilaterally.  Gait normal with good balance and coordination.  MSK:  Non tender with full range of motion and good stability and symmetric strength and tone of shoulders, elbows, wrist, hip, knee and ankles bilaterally.  Neck exam is unremarkable with full range of motion and full strength of the upper extremities.  Deep tendon reflexes are intact.  Right scapular region does still have some trigger points noted in the area and does have some mild scapular dyskinesis.  Number verbal consent patient was prepped with alcohol swabs and with a 25-gauge 1 inch needle patient was injected into 4 distinct trigger points in the left shoulder and periscapular region.  Total of 3 cc of 0.5% Marcaine and 1 cc of Kenalog 40 mg/mL used.  No blood loss.  Band-Aid placed.  Postinjection instructions given.    Impression and Recommendations:     This case required medical decision making of moderate complexity.      Note: This dictation was prepared with  Dragon dictation along with smaller phrase technology. Any transcriptional errors that result from this process are unintentional.

## 2018-03-23 NOTE — Telephone Encounter (Unsigned)
Copied from CRM [id number]. Topic: Inquiry >> Mar 23, 2018  8:11 AM Crist Infante wrote: Reason for CRM:  pt saw Dr Katrinka Blazing on Thursday, 4/25. Pt states she is not better at all, and still has the pain in her shoulder around to her breast. Pt wants to know if Dr Katrinka Blazing wants to see her again, as he mentioned an injection in the shoulder if not better. Please advise   >> Mar 23, 2018 11:54 AM Jolayne Haines L wrote: Patient called and stated that she is still in a lot of pain with her shoulder blade. She would like a nurse to call her back

## 2018-03-23 NOTE — Telephone Encounter (Signed)
I  Am sorry you are still hurting so badly   I am going to  Contact him with this information so as to make a plan.    For more eval and treatment

## 2018-03-24 ENCOUNTER — Ambulatory Visit: Payer: BLUE CROSS/BLUE SHIELD | Admitting: Family Medicine

## 2018-03-24 ENCOUNTER — Encounter: Payer: Self-pay | Admitting: Family Medicine

## 2018-03-24 DIAGNOSIS — M25511 Pain in right shoulder: Secondary | ICD-10-CM | POA: Insufficient documentation

## 2018-03-24 NOTE — Assessment & Plan Note (Signed)
Patient given trigger point in the right shoulder region today.  Discussed with patient in great length.  Still feel that potential musculoskeletal complaint is likely still the most common problem.  Patient is having no significant shortness of breath noted today.  No fevers or chills.  Patient has not had any significant abnormal weight loss.  Did discuss with patient's own due to the longevity of the symptoms and the severity of them if he continued to have significant difficulty and possible advanced imaging such as a CT chest and possibly even abdomen with and without contrast could be done.  Patient has had elevated LFTs previously as well as even though not an elevated white blood cell count patient did have what appeared to be an left shift..  Patient is in agreement with the plan will follow-up again in 2 weeks.

## 2018-03-24 NOTE — Patient Instructions (Signed)
Good to see you  Ice is yoru friend We did trigger points and I hope it helps If not consider 2 things Prilosec daily for 2 weeks (get over the counter) Acyclovir in case low smoldering shingles which could help  Otherwise we need a CT scan of your chest  See me again in 2-3 weeks

## 2018-03-26 ENCOUNTER — Other Ambulatory Visit: Payer: Self-pay

## 2018-03-26 ENCOUNTER — Telehealth: Payer: Self-pay | Admitting: Internal Medicine

## 2018-03-26 NOTE — Telephone Encounter (Signed)
Copied from CRM [id number]. Topic: Quick Communication - See Telephone Encounter >> Mar 26, 2018 11:44 AM Marylen Ponto wrote: CRM for notification. See Telephone encounter for: 03/26/18.  Pt called in stating she is still experiencing pain in her shoulder and would like Dr. Katrinka Blazing to call in the medicine for shingles that he mentioned.  [facility] - [place] - [address] [place] [address] Phone: [phone] Fax: [phone]

## 2018-03-27 ENCOUNTER — Encounter: Payer: Self-pay | Admitting: Family Medicine

## 2018-03-27 DIAGNOSIS — R079 Chest pain, unspecified: Secondary | ICD-10-CM

## 2018-03-27 NOTE — Telephone Encounter (Signed)
Pt calling back stating she is still in pain with her shoulder.  Pt states if the injections were going to work, she thinks it would have by now.  Pt would like a call back to discuss asap.

## 2018-03-27 NOTE — Telephone Encounter (Signed)
Need CT scan with and without contrast of chest secondary to chronic pain that seems to be worsening.

## 2018-03-29 ENCOUNTER — Encounter: Payer: Self-pay | Admitting: Internal Medicine

## 2018-03-30 NOTE — Telephone Encounter (Signed)
CT ordered & sent to [place] [facility].

## 2018-03-31 ENCOUNTER — Encounter: Payer: Self-pay | Admitting: Family Medicine

## 2018-03-31 NOTE — Telephone Encounter (Signed)
Copied from CRM [id number]. Topic: General - Other >> Mar 31, 2018  9:37 AM Gerrianne [facility] wrote: Reason for CRM: Lurena Joiner from [facility] [phone] calling stating that the referral for the pt was put in wrong there appt is tomorrow at 8

## 2018-04-01 ENCOUNTER — Encounter: Payer: Self-pay | Admitting: Internal Medicine

## 2018-04-01 ENCOUNTER — Ambulatory Visit
Admission: RE | Admit: 2018-04-01 | Discharge: 2018-04-01 | Disposition: A | Discharge status: Home / Self Care | Payer: BLUE CROSS/BLUE SHIELD | Source: Ambulatory Visit | Attending: Family Medicine | Admitting: Family Medicine

## 2018-04-01 ENCOUNTER — Telehealth: Payer: Self-pay | Admitting: Family Medicine

## 2018-04-01 DIAGNOSIS — R079 Chest pain, unspecified: Secondary | ICD-10-CM | POA: Diagnosis not present

## 2018-04-01 MED ORDER — IOPAMIDOL (ISOVUE-370) INJECTION 76%
75.0000 mL | Freq: Once | INTRAVENOUS | Status: AC | PRN
  Administered 2018-04-01: 75 mL via INTRAVENOUS

## 2018-04-01 NOTE — Telephone Encounter (Signed)
Spoke with patient about her results.   Myra Rude, MD [facility] 04/01/2018, 12:04 PM

## 2018-04-01 NOTE — Telephone Encounter (Signed)
Please advise Dr Fabian Sharp if you are able to send something in for shoulder pain/symptom control or if it needs to come from Dr Katrinka Blazing since he did the trigger injection.

## 2018-04-02 NOTE — Telephone Encounter (Signed)
Pt calling to check status of request and stated her pharmacy if medication is sent in. She would like a call back if/when medication is sent. Ph# [phone]  [facility] - [place] - [address] [phone] (Phone) [phone] (Fax)

## 2018-04-02 NOTE — Telephone Encounter (Signed)
Dr. Katrinka Blazing, Please advise patient's request. Dr. Fabian Sharp would like your input since the patient did not find tramadol or trigger point helpful.

## 2018-04-03 ENCOUNTER — Encounter: Payer: Self-pay | Admitting: Internal Medicine

## 2018-04-05 MED ORDER — ACYCLOVIR 400 MG PO TABS: 400.0000 mg | ORAL_TABLET | Freq: Three times a day (TID) | ORAL | 0 refills | Status: DC

## 2018-04-06 NOTE — Telephone Encounter (Signed)
If this was some  type of  Shingles without  A rash  Antiviral wont help very much this far out .       Can review more at  Your  cpx this week .

## 2018-04-07 NOTE — Progress Notes (Signed)
Chief Complaint  Patient presents with  . Annual Exam    no new concerns    HPI: Patient  Dana [name]  62 y.o. comes in today for Preventive Health Care visit   thryoid  No change med.  Lipid taking med no se  Neuritis  "shingles without the rash"  Much better and hot flushes  On    200 mg per night and sleeping better .  Health Maintenance  Topic Date Due  . HIV Screening  08/31/1969  . TETANUS/TDAP  08/31/1973  . PAP SMEAR  04/21/2016  . INFLUENZA VACCINE  06/25/2018  . MAMMOGRAM  06/14/2019  . COLONOSCOPY  09/29/2023  . Hepatitis C Screening  Completed   Health Maintenance Review LIFESTYLE:  Exercise:    cleanig house active  Tobacco/ETS: no Alcohol:  no Sugar beverages:  no Sleep:better on the gabapentin .  On 200 mg  Drug use: no HH of  2  Chickens  Work: 40 at work   No hx of abnormal pap .  2005  Last   Bleeding. Period   Decline  Td  Shot   ROS:  GEN/ HEENT: No fever, significant weight changes sweats headaches vision problems hearing changes, CV/ PULM; No chest pain shortness of breath cough, syncope,edema  change in exercise tolerance. GI /GU: No adominal pain, vomiting, change in bowel habits. No blood in the stool. No significant GU symptoms. SKIN/HEME: ,no acute skin rashes suspicious lesions or bleeding. No lymphadenopathy, nodules, masses.  NEURO/ PSYCH:  No neurologic signs such as weakness numbness. No depression anxiety. IMM/ Allergy: No unusual infections.  Allergy .   REST of 12 system review negative except as per HPI   Past Medical History:  Diagnosis Date  . Abnormal finding on MRI of brain 01/10/2015  . CVA (cerebral infarction) 2010   diplopia neg acute MRi whit matter changes neg LP   . High blood pressure    readings  . High cholesterol   . Hx of varicella   . Migraine with aura    auras  . Non-cardiac chest pain    hops and evaluation 2011 dr Lia Foyer  . Other and unspecified hyperlipidemia 04/27/2012   Has been on meds for  a number of years .  Low dose crestor .  Baseline reported as 280/   . PFO (patent foramen ovale)    Moderate size  . Pneumonia 2007   and flu hospitalized   . Stroke [facility])    on hrt double vision- possible blood clot on brain; Dr. Floyde Parkins at [facility]   . Thyroid disease     Past Surgical History:  Procedure Laterality Date  . BREAST SURGERY     implants  . COLONOSCOPY    . FOOT SURGERY     had bunyon removed  . TONSILLECTOMY      Family History  Adopted: Yes  Problem Relation Age of Onset  . Cancer Mother        pancreatic  . Alcohol abuse Mother   . Breast cancer Maternal Aunt     Social History   Socioeconomic History  . Marital status: Married    Spouse name: Not on file  . Number of children: 1  . Years of education: Not on file  . Highest education level: Not on file  Occupational History  . Not on file  Social Needs  . Financial resource strain: Not on file  . Food insecurity:    Worry:  Not on file    Inability: Not on file  . Transportation needs:    Medical: Not on file    Non-medical: Not on file  Tobacco Use  . Smoking status: Never Smoker  . Smokeless tobacco: Never Used  Substance and Sexual Activity  . Alcohol use: No  . Drug use: No  . Sexual activity: Yes  Lifestyle  . Physical activity:    Days per week: Not on file    Minutes per session: Not on file  . Stress: Not on file  Relationships  . Social connections:    Talks on phone: Not on file    Gets together: Not on file    Attends religious service: Not on file    Active member of club or organization: Not on file    Attends meetings of clubs or organizations: Not on file    Relationship status: Not on file  Other Topics Concern  . 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    Neg tad   Some caffiene    Seatbelts no ets fa stored    G1P1   Physically active     28 yo grandaughter    Patient is left handed.       Allergies as of 04/08/2018      Reactions   Estrogens Other (See Comments)   Had stroke on HRT/OCP   Prednisone Other (See Comments)   Jittery and irritable hard to sleep with oral pred   Codeine    REACTION: nausea   Ibuprofen    REACTION: nause      Medication List        Accurate as of 04/08/18  5:13 PM. Always use your most recent med list.          aspirin 81 MG tablet Take 81 mg by mouth daily.   atorvastatin 20 MG tablet Commonly known as:  LIPITOR Take 1 tablet (20 mg total) by mouth daily.   gabapentin 100 MG capsule Commonly known as:  NEURONTIN Take 100 mg twice a day and increase to  300 mg twice a day over 3 days   levothyroxine 75 MCG tablet Commonly known as:  SYNTHROID, LEVOTHROID Take 1 tablet (75 mcg total) by mouth daily.         EXAM:  BP 124/72 (BP Location: Right Arm, Patient Position: Sitting, Cuff Size: Normal)   Pulse 83   Temp 98.4 F (36.9 C) (Oral)   Ht 5' 3.75" (1.619 m)   Wt 132 lb 12.8 oz (60.2 kg)   LMP 04/25/2009   BMI 22.97 kg/m   Body mass index is 22.97 kg/m. Wt Readings from Last 3 Encounters:  04/08/18 132 lb 12.8 oz (60.2 kg)  03/24/18 134 lb (60.8 kg)  03/19/18 134 lb (60.8 kg)    Physical Exam: Vital signs reviewed OBS:JGGE is a well-developed well-nourished alert cooperative    who appearsr stated age in no acute distress.  HEENT: normocephalic atraumatic , Eyes: PERRL EOM's full, conjunctiva clear, Nares: paten,t no deformity discharge or tenderness., Ears: no deformity EAC's clear TMs with normal landmarks. Mouth: clear OP, no lesions, edema.  Moist mucous membranes. Dentition in adequate repair. NECK:  supple without masses, thyromegaly or bruits. CHEST/PULM:  Clear to auscultation and percussion breath sounds equal no wheeze , rales or rhonchi. No  chest wall deformities or tenderness. Breast: normal by inspection . No dimpling, discharge, masses, tenderness or discharge . CV: PMI is nondisplaced, S1 S2 no gallops, murmurs, rubs. Peripheral pulses are full without delay.No JVD .  ABDOMEN: Bowel sounds normal nontender  No guard or rebound, no hepato splenomegal no CVA tenderness.  No hernia. Extremtities:  No clubbing cyanosis or edema, no acute joint swelling or redness no focal atrophy NEURO:  Oriented x3, cranial nerves 3-12 appear to be intact, no obvious focal weakness,gait within normal limits no abnormal reflexes or asymmetrical SKIN: No acute rashes normal turgor, color,no or petechiae. PSYCH: Oriented, good eye contact, no obvious depression anxiety, cognition and judgment appear normal. LN: no cervical axillary inguinal adenopathy Pelvic: NL ext GU, labia clear without lesions or rash . Vagina no lesions .Cervix: clear  UTERUS: Neg CMT Adnexa:  clear no masses . PAP done w high risk hpv    Lab Results  Component Value Date   WBC 10.4 03/11/2018   HGB 13.4 03/11/2018   HCT 39.8 03/11/2018   PLT 275.0 03/11/2018   GLUCOSE 90 03/11/2018   CHOL 168 03/11/2018   TRIG 37.0 03/11/2018   HDL 85.20 03/11/2018   LDLCALC 76 03/11/2018   ALT 31 03/11/2018   AST 26 03/11/2018   NA 137 03/11/2018   K 4.3 03/11/2018   CL 102 03/11/2018   CREATININE 0.86 03/11/2018   BUN 14 03/11/2018   CO2 26 03/11/2018   TSH 0.90 03/11/2018   INR 0.97 03/01/2010    BP Readings from Last 3 Encounters:  04/08/18 124/72  03/24/18 (!) 150/78  03/19/18 132/90    Lab results reviewed with patient   ASSESSMENT AND PLAN:  Discussed the following assessment and plan:  Visit for preventive health examination  Alkaline phosphatase elevation - Plan: Gamma GT, Hepatic function panel, Alkaline Phosphatase, Isoenzymes, Alkaline Phosphatase, Isoenzymes, CANCELED: Alkaline Phosphatase, Isoenzymes  Pap smear for cervical cancer screening - Plan:  Cytology - PAP  Menopausal hot flushes - better on 200 mg gaba per night  ok to continue   Medication management  Hypothyroidism, unspecified type  Hyperlipidemia, unspecified hyperlipidemia type  Tetanus, diphtheria, and acellular pertussis (Tdap) vaccination declined Uncertain cause ele alk phos  Waxing and waning   Plan recheck lab as above  Can remain on the gabapentin  As lng as helping  Patient Care Team: Gertie Broerman, Standley Brooking, MD as PCP - General (Internal Medicine) Hillary Bow, MD (Cardiology) Brien Few, MD as Attending Physician (Obstetrics and Gynecology) Kathrynn Ducking, MD as Consulting Physician (Neurology) Patient Instructions  Glad you are doing better . Will notify you  of labs/PAP when available.  Repeating alk phos   And will follow .  Your exam is normal    Health Maintenance, Female Adopting a healthy lifestyle and getting preventive care can go a long way to promote health and wellness. Talk with your health care provider about what schedule of regular examinations is right for you. This is a good chance for you to check in with your provider about disease prevention and staying healthy. In between checkups, there are plenty of things you can do on your own. Experts have done a lot of research about which lifestyle changes and preventive measures are most likely to keep you healthy. Ask your health care provider for more information. Weight and  diet Eat a healthy diet  Be sure to include plenty of vegetables, fruits, low-fat dairy products, and lean protein.  Do not eat a lot of foods high in solid fats, added sugars, or salt.  Get regular exercise. This is one of the most important things you can do for your health. ? Most adults should exercise for at least 150 minutes each week. The exercise should increase your heart rate and make you sweat (moderate-intensity exercise). ? Most adults should also do strengthening exercises at least twice a week.  This is in addition to the moderate-intensity exercise.  Maintain a healthy weight  Body mass index (BMI) is a measurement that can be used to identify possible weight problems. It estimates body fat based on height and weight. Your health care provider can help determine your BMI and help you achieve or maintain a healthy weight.  For females 71 years of age and older: ? A BMI below 18.5 is considered underweight. ? A BMI of 18.5 to 24.9 is normal. ? A BMI of 25 to 29.9 is considered overweight. ? A BMI of 30 and above is considered obese.  Watch levels of cholesterol and blood lipids  You should start having your blood tested for lipids and cholesterol at 64 years of age, then have this test every 5 years.  You may need to have your cholesterol levels checked more often if: ? Your lipid or cholesterol levels are high. ? You are older than 64 years of age. ? You are at high risk for heart disease.  Cancer screening Lung Cancer  Lung cancer screening is recommended for adults 79-67 years old who are at high risk for lung cancer because of a history of smoking.  A yearly low-dose CT scan of the lungs is recommended for people who: ? Currently smoke. ? Have quit within the past 15 years. ? Have at least a 30-pack-year history of smoking. A pack year is smoking an average of one pack of cigarettes a day for 1 year.  Yearly screening should continue until it has been 15 years since you quit.  Yearly screening should stop if you develop a health problem that would prevent you from having lung cancer treatment.  Breast Cancer  Practice breast self-awareness. This means understanding how your breasts normally appear and feel.  It also means doing regular breast self-exams. Let your health care provider know about any changes, no matter how small.  If you are in your 20s or 30s, you should have a clinical breast exam (CBE) by a health care provider every 1-3 years as part of a  regular health exam.  If you are 5 or older, have a CBE every year. Also consider having a breast X-ray (mammogram) every year.  If you have a family history of breast cancer, talk to your health care provider about genetic screening.  If you are at high risk for breast cancer, talk to your health care provider about having an MRI and a mammogram every year.  Breast cancer gene (BRCA) assessment is recommended for women who have family members with BRCA-related cancers. BRCA-related cancers include: ? Breast. ? Ovarian. ? Tubal. ? Peritoneal cancers.  Results of the assessment will determine the need for genetic counseling and BRCA1 and BRCA2 testing.  Cervical Cancer Your health care provider may recommend that you be screened regularly for cancer of the pelvic organs (ovaries, uterus, and vagina). This screening involves a pelvic examination, including checking for microscopic changes to  the surface of your cervix (Pap test). You may be encouraged to have this screening done every 3 years, beginning at age 61.  For women ages 59-65, health care providers may recommend pelvic exams and Pap testing every 3 years, or they may recommend the Pap and pelvic exam, combined with testing for human papilloma virus (HPV), every 5 years. Some types of HPV increase your risk of cervical cancer. Testing for HPV may also be done on women of any age with unclear Pap test results.  Other health care providers may not recommend any screening for nonpregnant women who are considered low risk for pelvic cancer and who do not have symptoms. Ask your health care provider if a screening pelvic exam is right for you.  If you have had past treatment for cervical cancer or a condition that could lead to cancer, you need Pap tests and screening for cancer for at least 20 years after your treatment. If Pap tests have been discontinued, your risk factors (such as having a new sexual partner) need to be reassessed to  determine if screening should resume. Some women have medical problems that increase the chance of getting cervical cancer. In these cases, your health care provider may recommend more frequent screening and Pap tests.  Colorectal Cancer  This type of cancer can be detected and often prevented.  Routine colorectal cancer screening usually begins at 64 years of age and continues through 64 years of age.  Your health care provider may recommend screening at an earlier age if you have risk factors for colon cancer.  Your health care provider may also recommend using home test kits to check for hidden blood in the stool.  A small camera at the end of a tube can be used to examine your colon directly (sigmoidoscopy or colonoscopy). This is done to check for the earliest forms of colorectal cancer.  Routine screening usually begins at age 6.  Direct examination of the colon should be repeated every 5-10 years through 64 years of age. However, you may need to be screened more often if early forms of precancerous polyps or small growths are found.  Skin Cancer  Check your skin from head to toe regularly.  Tell your health care provider about any new moles or changes in moles, especially if there is a change in a mole's shape or color.  Also tell your health care provider if you have a mole that is larger than the size of a pencil eraser.  Always use sunscreen. Apply sunscreen liberally and repeatedly throughout the day.  Protect yourself by wearing long sleeves, pants, a wide-brimmed hat, and sunglasses whenever you are outside.  Heart disease, diabetes, and high blood pressure  High blood pressure causes heart disease and increases the risk of stroke. High blood pressure is more likely to develop in: ? People who have blood pressure in the high end of the normal range (130-139/85-89 mm Hg). ? People who are overweight or obese. ? People who are African American.  If you are 18-39 years  of age, have your blood pressure checked every 3-5 years. If you are 33 years of age or older, have your blood pressure checked every year. You should have your blood pressure measured twice-once when you are at a hospital or clinic, and once when you are not at a hospital or clinic. Record the average of the two measurements. To check your blood pressure when you are not at a hospital or clinic, you  can use: ? An automated blood pressure machine at a pharmacy. ? A home blood pressure monitor.  If you are between 72 years and 48 years old, ask your health care provider if you should take aspirin to prevent strokes.  Have regular diabetes screenings. This involves taking a blood sample to check your fasting blood sugar level. ? If you are at a normal weight and have a low risk for diabetes, have this test once every three years after 64 years of age. ? If you are overweight and have a high risk for diabetes, consider being tested at a younger age or more often. Preventing infection Hepatitis B  If you have a higher risk for hepatitis B, you should be screened for this virus. You are considered at high risk for hepatitis B if: ? You were born in a country where hepatitis B is common. Ask your health care provider which countries are considered high risk. ? Your parents were born in a high-risk country, and you have not been immunized against hepatitis B (hepatitis B vaccine). ? You have HIV or AIDS. ? You use needles to inject street drugs. ? You live with someone who has hepatitis B. ? You have had sex with someone who has hepatitis B. ? You get hemodialysis treatment. ? You take certain medicines for conditions, including cancer, organ transplantation, and autoimmune conditions.  Hepatitis C  Blood testing is recommended for: ? Everyone born from [birth year] through 1967. ? Anyone with known risk factors for hepatitis C.  Sexually transmitted infections (STIs)  You should be screened for  sexually transmitted infections (STIs) including gonorrhea and chlamydia if: ? You are sexually active and are younger than 64 years of age. ? You are older than 64 years of age and your health care provider tells you that you are at risk for this type of infection. ? Your sexual activity has changed since you were last screened and you are at an increased risk for chlamydia or gonorrhea. Ask your health care provider if you are at risk.  If you do not have HIV, but are at risk, it may be recommended that you take a prescription medicine daily to prevent HIV infection. This is called pre-exposure prophylaxis (PrEP). You are considered at risk if: ? You are sexually active and do not regularly use condoms or know the HIV status of your partner(s). ? You take drugs by injection. ? You are sexually active with a partner who has HIV.  Talk with your health care provider about whether you are at high risk of being infected with HIV. If you choose to begin PrEP, you should first be tested for HIV. You should then be tested every 3 months for as long as you are taking PrEP. Pregnancy  If you are premenopausal and you may become pregnant, ask your health care provider about preconception counseling.  If you may become pregnant, take 400 to 800 micrograms (mcg) of folic acid every day.  If you want to prevent pregnancy, talk to your health care provider about birth control (contraception). Osteoporosis and menopause  Osteoporosis is a disease in which the bones lose minerals and strength with aging. This can result in serious bone fractures. Your risk for osteoporosis can be identified using a bone density scan.  If you are 100 years of age or older, or if you are at risk for osteoporosis and fractures, ask your health care provider if you should be screened.  Ask your health care  provider whether you should take a calcium or vitamin D supplement to lower your risk for osteoporosis.  Menopause may  have certain physical symptoms and risks.  Hormone replacement therapy may reduce some of these symptoms and risks. Talk to your health care provider about whether hormone replacement therapy is right for you. Follow these instructions at home:  Schedule regular health, dental, and eye exams.  Stay current with your immunizations.  Do not use any tobacco products including cigarettes, chewing tobacco, or electronic cigarettes.  If you are pregnant, do not drink alcohol.  If you are breastfeeding, limit how much and how often you drink alcohol.  Limit alcohol intake to no more than 1 drink per day for nonpregnant women. One drink equals 12 ounces of beer, 5 ounces of wine, or 1 ounces of hard liquor.  Do not use street drugs.  Do not share needles.  Ask your health care provider for help if you need support or information about quitting drugs.  Tell your health care provider if you often feel depressed.  Tell your health care provider if you have ever been abused or do not feel safe at home. This information is not intended to replace advice given to you by your health care provider. Make sure you discuss any questions you have with your health care provider. Document Released: 05/27/2011 Document Revised: 04/18/2016 Document Reviewed: 08/15/2015 Elsevier Interactive Patient Education  2018 [place]. Ellisha Bankson M.D.

## 2018-04-08 ENCOUNTER — Ambulatory Visit (INDEPENDENT_AMBULATORY_CARE_PROVIDER_SITE_OTHER): Payer: BLUE CROSS/BLUE SHIELD | Admitting: Internal Medicine

## 2018-04-08 ENCOUNTER — Other Ambulatory Visit (HOSPITAL_COMMUNITY)
Admission: RE | Admit: 2018-04-08 | Discharge: 2018-04-08 | Disposition: A | Discharge status: Home / Self Care | Payer: BLUE CROSS/BLUE SHIELD | Source: Ambulatory Visit | Attending: Internal Medicine | Admitting: Internal Medicine

## 2018-04-08 ENCOUNTER — Encounter: Payer: Self-pay | Admitting: Internal Medicine

## 2018-04-08 VITALS — BP 124/72 | HR 83 | Temp 98.4°F | Ht 63.75 in | Wt 132.8 lb

## 2018-04-08 DIAGNOSIS — R748 Abnormal levels of other serum enzymes: Secondary | ICD-10-CM

## 2018-04-08 DIAGNOSIS — Z79899 Other long term (current) drug therapy: Secondary | ICD-10-CM

## 2018-04-08 DIAGNOSIS — Z124 Encounter for screening for malignant neoplasm of cervix: Secondary | ICD-10-CM | POA: Insufficient documentation

## 2018-04-08 DIAGNOSIS — K743 Primary biliary cirrhosis: Secondary | ICD-10-CM

## 2018-04-08 DIAGNOSIS — Z2821 Immunization not carried out because of patient refusal: Secondary | ICD-10-CM | POA: Diagnosis not present

## 2018-04-08 DIAGNOSIS — E785 Hyperlipidemia, unspecified: Secondary | ICD-10-CM

## 2018-04-08 DIAGNOSIS — E039 Hypothyroidism, unspecified: Secondary | ICD-10-CM

## 2018-04-08 DIAGNOSIS — N951 Menopausal and female climacteric states: Secondary | ICD-10-CM | POA: Diagnosis not present

## 2018-04-08 DIAGNOSIS — Z0000 Encounter for general adult medical examination without abnormal findings: Secondary | ICD-10-CM | POA: Diagnosis not present

## 2018-04-08 HISTORY — DX: Primary biliary cirrhosis: K74.3

## 2018-04-08 MED ORDER — GABAPENTIN 100 MG PO CAPS: ORAL_CAPSULE | ORAL | 1 refills | Status: DC

## 2018-04-08 NOTE — Patient Instructions (Signed)
Glad you are doing better . Will notify you  of labs/PAP when available.  Repeating alk phos   And will follow .  Your exam is normal    Health Maintenance, Female Adopting a healthy lifestyle and getting preventive care can go a long way to promote health and wellness. Talk with your health care provider about what schedule of regular examinations is right for you. This is a good chance for you to check in with your provider about disease prevention and staying healthy. In between checkups, there are plenty of things you can do on your own. Experts have done a lot of research about which lifestyle changes and preventive measures are most likely to keep you healthy. Ask your health care provider for more information. Weight and diet Eat a healthy diet  Be sure to include plenty of vegetables, fruits, low-fat dairy products, and lean protein.  Do not eat a lot of foods high in solid fats, added sugars, or salt.  Get regular exercise. This is one of the most important things you can do for your health. ? Most adults should exercise for at least 150 minutes each week. The exercise should increase your heart rate and make you sweat (moderate-intensity exercise). ? Most adults should also do strengthening exercises at least twice a week. This is in addition to the moderate-intensity exercise.  Maintain a healthy weight  Body mass index (BMI) is a measurement that can be used to identify possible weight problems. It estimates body fat based on height and weight. Your health care provider can help determine your BMI and help you achieve or maintain a healthy weight.  For females 25 years of age and older: ? A BMI below 18.5 is considered underweight. ? A BMI of 18.5 to 24.9 is normal. ? A BMI of 25 to 29.9 is considered overweight. ? A BMI of 30 and above is considered obese.  Watch levels of cholesterol and blood lipids  You should start having your blood tested for lipids and cholesterol  at 64 years of age, then have this test every 5 years.  You may need to have your cholesterol levels checked more often if: ? Your lipid or cholesterol levels are high. ? You are older than 64 years of age. ? You are at high risk for heart disease.  Cancer screening Lung Cancer  Lung cancer screening is recommended for adults 55-50 years old who are at high risk for lung cancer because of a history of smoking.  A yearly low-dose CT scan of the lungs is recommended for people who: ? Currently smoke. ? Have quit within the past 15 years. ? Have at least a 30-pack-year history of smoking. A pack year is smoking an average of one pack of cigarettes a day for 1 year.  Yearly screening should continue until it has been 15 years since you quit.  Yearly screening should stop if you develop a health problem that would prevent you from having lung cancer treatment.  Breast Cancer  Practice breast self-awareness. This means understanding how your breasts normally appear and feel.  It also means doing regular breast self-exams. Let your health care provider know about any changes, no matter how small.  If you are in your 20s or 30s, you should have a clinical breast exam (CBE) by a health care provider every 1-3 years as part of a regular health exam.  If you are 25 or older, have a CBE every year. Also consider having  a breast X-ray (mammogram) every year.  If you have a family history of breast cancer, talk to your health care provider about genetic screening.  If you are at high risk for breast cancer, talk to your health care provider about having an MRI and a mammogram every year.  Breast cancer gene (BRCA) assessment is recommended for women who have family members with BRCA-related cancers. BRCA-related cancers include: ? Breast. ? Ovarian. ? Tubal. ? Peritoneal cancers.  Results of the assessment will determine the need for genetic counseling and BRCA1 and BRCA2  testing.  Cervical Cancer Your health care provider may recommend that you be screened regularly for cancer of the pelvic organs (ovaries, uterus, and vagina). This screening involves a pelvic examination, including checking for microscopic changes to the surface of your cervix (Pap test). You may be encouraged to have this screening done every 3 years, beginning at age 64.  For women ages 77-65, health care providers may recommend pelvic exams and Pap testing every 3 years, or they may recommend the Pap and pelvic exam, combined with testing for human papilloma virus (HPV), every 5 years. Some types of HPV increase your risk of cervical cancer. Testing for HPV may also be done on women of any age with unclear Pap test results.  Other health care providers may not recommend any screening for nonpregnant women who are considered low risk for pelvic cancer and who do not have symptoms. Ask your health care provider if a screening pelvic exam is right for you.  If you have had past treatment for cervical cancer or a condition that could lead to cancer, you need Pap tests and screening for cancer for at least 20 years after your treatment. If Pap tests have been discontinued, your risk factors (such as having a new sexual partner) need to be reassessed to determine if screening should resume. Some women have medical problems that increase the chance of getting cervical cancer. In these cases, your health care provider may recommend more frequent screening and Pap tests.  Colorectal Cancer  This type of cancer can be detected and often prevented.  Routine colorectal cancer screening usually begins at 64 years of age and continues through 64 years of age.  Your health care provider may recommend screening at an earlier age if you have risk factors for colon cancer.  Your health care provider may also recommend using home test kits to check for hidden blood in the stool.  A small camera at the end of a  tube can be used to examine your colon directly (sigmoidoscopy or colonoscopy). This is done to check for the earliest forms of colorectal cancer.  Routine screening usually begins at age 75.  Direct examination of the colon should be repeated every 5-10 years through 64 years of age. However, you may need to be screened more often if early forms of precancerous polyps or small growths are found.  Skin Cancer  Check your skin from head to toe regularly.  Tell your health care provider about any new moles or changes in moles, especially if there is a change in a mole's shape or color.  Also tell your health care provider if you have a mole that is larger than the size of a pencil eraser.  Always use sunscreen. Apply sunscreen liberally and repeatedly throughout the day.  Protect yourself by wearing long sleeves, pants, a wide-brimmed hat, and sunglasses whenever you are outside.  Heart disease, diabetes, and high blood pressure  High blood pressure causes heart disease and increases the risk of stroke. High blood pressure is more likely to develop in: ? People who have blood pressure in the high end of the normal range (130-139/85-89 mm Hg). ? People who are overweight or obese. ? People who are African American.  If you are 97-43 years of age, have your blood pressure checked every 3-5 years. If you are 34 years of age or older, have your blood pressure checked every year. You should have your blood pressure measured twice-once when you are at a hospital or clinic, and once when you are not at a hospital or clinic. Record the average of the two measurements. To check your blood pressure when you are not at a hospital or clinic, you can use: ? An automated blood pressure machine at a pharmacy. ? A home blood pressure monitor.  If you are between 39 years and 85 years old, ask your health care provider if you should take aspirin to prevent strokes.  Have regular diabetes screenings. This  involves taking a blood sample to check your fasting blood sugar level. ? If you are at a normal weight and have a low risk for diabetes, have this test once every three years after 64 years of age. ? If you are overweight and have a high risk for diabetes, consider being tested at a younger age or more often. Preventing infection Hepatitis B  If you have a higher risk for hepatitis B, you should be screened for this virus. You are considered at high risk for hepatitis B if: ? You were born in a country where hepatitis B is common. Ask your health care provider which countries are considered high risk. ? Your parents were born in a high-risk country, and you have not been immunized against hepatitis B (hepatitis B vaccine). ? You have HIV or AIDS. ? You use needles to inject street drugs. ? You live with someone who has hepatitis B. ? You have had sex with someone who has hepatitis B. ? You get hemodialysis treatment. ? You take certain medicines for conditions, including cancer, organ transplantation, and autoimmune conditions.  Hepatitis C  Blood testing is recommended for: ? Everyone born from [birth year] through 1967. ? Anyone with known risk factors for hepatitis C.  Sexually transmitted infections (STIs)  You should be screened for sexually transmitted infections (STIs) including gonorrhea and chlamydia if: ? You are sexually active and are younger than 64 years of age. ? You are older than 64 years of age and your health care provider tells you that you are at risk for this type of infection. ? Your sexual activity has changed since you were last screened and you are at an increased risk for chlamydia or gonorrhea. Ask your health care provider if you are at risk.  If you do not have HIV, but are at risk, it may be recommended that you take a prescription medicine daily to prevent HIV infection. This is called pre-exposure prophylaxis (PrEP). You are considered at risk if: ? You are  sexually active and do not regularly use condoms or know the HIV status of your partner(s). ? You take drugs by injection. ? You are sexually active with a partner who has HIV.  Talk with your health care provider about whether you are at high risk of being infected with HIV. If you choose to begin PrEP, you should first be tested for HIV. You should then be tested every 3 months  for as long as you are taking PrEP. Pregnancy  If you are premenopausal and you may become pregnant, ask your health care provider about preconception counseling.  If you may become pregnant, take 400 to 800 micrograms (mcg) of folic acid every day.  If you want to prevent pregnancy, talk to your health care provider about birth control (contraception). Osteoporosis and menopause  Osteoporosis is a disease in which the bones lose minerals and strength with aging. This can result in serious bone fractures. Your risk for osteoporosis can be identified using a bone density scan.  If you are 40 years of age or older, or if you are at risk for osteoporosis and fractures, ask your health care provider if you should be screened.  Ask your health care provider whether you should take a calcium or vitamin D supplement to lower your risk for osteoporosis.  Menopause may have certain physical symptoms and risks.  Hormone replacement therapy may reduce some of these symptoms and risks. Talk to your health care provider about whether hormone replacement therapy is right for you. Follow these instructions at home:  Schedule regular health, dental, and eye exams.  Stay current with your immunizations.  Do not use any tobacco products including cigarettes, chewing tobacco, or electronic cigarettes.  If you are pregnant, do not drink alcohol.  If you are breastfeeding, limit how much and how often you drink alcohol.  Limit alcohol intake to no more than 1 drink per day for nonpregnant women. One drink equals 12 ounces of  beer, 5 ounces of wine, or 1 ounces of hard liquor.  Do not use street drugs.  Do not share needles.  Ask your health care provider for help if you need support or information about quitting drugs.  Tell your health care provider if you often feel depressed.  Tell your health care provider if you have ever been abused or do not feel safe at home. This information is not intended to replace advice given to you by your health care provider. Make sure you discuss any questions you have with your health care provider. Document Released: 05/27/2011 Document Revised: 04/18/2016 Document Reviewed: 08/15/2015 Elsevier Interactive Patient Education  [employer].

## 2018-04-09 LAB — HEPATIC FUNCTION PANEL
ALK PHOS: 165 U/L — AB (ref 39–117)
ALT: 58 U/L — ABNORMAL HIGH (ref 0–35)
AST: 50 U/L — ABNORMAL HIGH (ref 0–37)
Albumin: 3.8 g/dL (ref 3.5–5.2)
BILIRUBIN DIRECT: 0.1 mg/dL (ref 0.0–0.3)
Total Bilirubin: 0.4 mg/dL (ref 0.2–1.2)
Total Protein: 6.9 g/dL (ref 6.0–8.3)

## 2018-04-09 LAB — CYTOLOGY - PAP
Diagnosis: NEGATIVE
HPV: NOT DETECTED

## 2018-04-09 LAB — GAMMA GT: GGT: 92 U/L — AB (ref 7–51)

## 2018-04-13 ENCOUNTER — Encounter: Payer: Self-pay | Admitting: Internal Medicine

## 2018-04-13 LAB — ALKALINE PHOSPHATASE, ISOENZYMES
Alkaline Phosphatase: 182 IU/L — ABNORMAL HIGH (ref 39–117)
BONE FRACTION: 24 % (ref 14–68)
INTESTINAL FRAC.: 0 % (ref 0–18)
LIVER FRACTION: 76 % (ref 18–85)

## 2018-04-15 ENCOUNTER — Other Ambulatory Visit: Payer: Self-pay | Admitting: Internal Medicine

## 2018-04-15 DIAGNOSIS — R748 Abnormal levels of other serum enzymes: Secondary | ICD-10-CM

## 2018-04-15 DIAGNOSIS — R7989 Other specified abnormal findings of blood chemistry: Secondary | ICD-10-CM

## 2018-04-15 DIAGNOSIS — R945 Abnormal results of liver function studies: Secondary | ICD-10-CM

## 2018-04-22 ENCOUNTER — Encounter: Payer: Self-pay | Admitting: Gastroenterology

## 2018-04-22 ENCOUNTER — Ambulatory Visit: Payer: BLUE CROSS/BLUE SHIELD | Admitting: Gastroenterology

## 2018-04-22 ENCOUNTER — Other Ambulatory Visit (INDEPENDENT_AMBULATORY_CARE_PROVIDER_SITE_OTHER): Payer: BLUE CROSS/BLUE SHIELD

## 2018-04-22 ENCOUNTER — Ambulatory Visit: Payer: BLUE CROSS/BLUE SHIELD | Admitting: Family Medicine

## 2018-04-22 VITALS — BP 140/88 | HR 72 | Ht 64.0 in | Wt 132.0 lb

## 2018-04-22 DIAGNOSIS — R945 Abnormal results of liver function studies: Secondary | ICD-10-CM

## 2018-04-22 DIAGNOSIS — R7989 Other specified abnormal findings of blood chemistry: Secondary | ICD-10-CM

## 2018-04-22 DIAGNOSIS — K5909 Other constipation: Secondary | ICD-10-CM | POA: Diagnosis not present

## 2018-04-22 LAB — FERRITIN: FERRITIN: 122.9 ng/mL (ref 10.0–291.0)

## 2018-04-22 LAB — IBC PANEL
Iron: 43 ug/dL (ref 42–145)
Saturation Ratios: 11 % — ABNORMAL LOW (ref 20.0–50.0)
Transferrin: 280 mg/dL (ref 212.0–360.0)

## 2018-04-22 LAB — IGA: IGA: 133 mg/dL (ref 68–378)

## 2018-04-22 NOTE — Progress Notes (Addendum)
04/22/2018 Dana [name] [id number] 1956-05-12   HISTORY OF PRESENT ILLNESS:  This is a 62 year old female who is a patient of Dr. Marvell Fuller.  She has been referred here on this occasion by her PCP, Dr. Fabian Sharp for elevated LFT's, mostly elevated ALP, which has actually been elevated for a couple of years.  Others have been normal until recently and on most recent labs AST and ALT mildly elevated.  ALP max of 165.  ALT now 58 and AST 50.  Isoenzymes all normal.  Actually had ultrasound in 2017 that was normal.  Viral Hep B and C studies negative in 2015.  AMA normal a couple of years ago.  No symptoms really except does get occasional pain in right shoulder blade.    Also mentions her constipation briefly.  Tells me it is chronic.  Says that she has a BM about every 3-4 days.  Then I tried to discuss use of daily Miralax or other options but patient did not really seem interested in trying any type of regimen at this point.  Says that it has always been like that.   Past Medical History:  Diagnosis Date  . Abnormal finding on MRI of brain 01/10/2015  . CVA (cerebral infarction) 2010   diplopia neg acute MRi whit matter changes neg LP   . High blood pressure    readings  . High cholesterol   . Hx of varicella   . Migraine with aura    auras  . Non-cardiac chest pain    hops and evaluation 2011 dr Riley Kill  . Other and unspecified hyperlipidemia 04/27/2012   Has been on meds for a number of years .  Low dose crestor .  Baseline reported as 280/   . PFO (patent foramen ovale)    Moderate size  . Pneumonia 2007   and flu hospitalized   . Stroke [facility])    on hrt double vision- possible blood clot on brain; Dr. Lesia Sago at [facility]   . Thyroid disease    Past Surgical History:  Procedure Laterality Date  . BREAST SURGERY     implants  . COLONOSCOPY    . FOOT SURGERY     had bunyon removed  . TONSILLECTOMY      reports that she has never smoked. She has never used  smokeless tobacco. She reports that she does not drink alcohol or use drugs. family history includes Alcohol abuse in her mother; Breast cancer in her maternal aunt; Cancer in her mother. She was adopted. Allergies  Allergen Reactions  . Estrogens Other (See Comments)    Had stroke on HRT/OCP  . Prednisone Other (See Comments)    Jittery and irritable hard to sleep with oral pred  . Codeine     REACTION: nausea  . Ibuprofen     REACTION: nause      Outpatient Encounter Medications as of 04/22/2018  Medication Sig  . aspirin 81 MG tablet Take 81 mg by mouth daily.  Marland Kitchen atorvastatin (LIPITOR) 20 MG tablet Take 1 tablet (20 mg total) by mouth daily.  Marland Kitchen gabapentin (NEURONTIN) 100 MG capsule Take  200 - 300 mg per night or as directed (Patient taking differently: 300 mg. Take  200 - 300 mg per night or as directed)  . levothyroxine (SYNTHROID, LEVOTHROID) 75 MCG tablet Take 1 tablet (75 mcg total) by mouth daily.   No facility-administered encounter medications on file as of 04/22/2018.  REVIEW OF SYSTEMS  : All other systems reviewed and negative except where noted in the History of Present Illness.   PHYSICAL EXAM: BP 140/88   Pulse 72   Ht 5\' 4"  (1.626 m)   Wt 132 lb (59.9 kg)   LMP 04/25/2009   BMI 22.66 kg/m  General: Well developed white female in no acute distress Head: Normocephalic and atraumatic Eyes:  Sclerae anicteric, conjunctiva pink. Ears: Normal auditory acuity Lungs: Clear throughout to auscultation; no increased WOB. Heart: Regular rate and rhythm; no M/R/G. Abdomen: Soft, non-distended.  BS present.  Non-tender. Musculoskeletal: Symmetrical with no gross deformities  Skin: No lesions on visible extremities Extremities: No edema  Neurological: Alert oriented x 4, grossly non-focal Psychological:  Alert and cooperative. Normal mood and affect  ASSESSMENT AND PLAN: *Elevated LFT's:  Mostly elevated ALP, which has actually been elevated for a couple of  years.  Others have been normal until recently and on most recent labs AST and ALT mildly elevated.  Had ultrasound in 2017 that was normal.  Viral Hep B and C studies negative in 2015.  No symptoms really except does get occasional pain in right shoulder blade.   *Chronic constipation:  Discussed use of daily Miralax but patient did not really seem interested in trying any type of regimen at this point.  -Will check remaining liver serologies to rule out other causes of chronic liver disease. -Will repeat abdominal ultrasound.   CC:  Panosh, Neta Mends, MD Agree with Ms. Lawrence Roldan's management.  Iva Boop, MD, Clementeen Graham

## 2018-04-22 NOTE — Patient Instructions (Signed)
Your provider has requested that you go to the basement level for lab work before leaving today. Press "B" on the elevator. The lab is located at the first door on the left as you exit the elevator.  You have been scheduled for an abdominal ultrasound at [facility] (1st floor of hospital) on 04/30/2018 at 10:30am. Please arrive 15 minutes prior to your appointment for registration. Make certain not to have anything to eat or drink after midnight prior to your appointment. Should you need to reschedule your appointment, please contact radiology at [phone]. This test typically takes about 30 minutes to perform.

## 2018-04-26 LAB — ANTI-NUCLEAR AB-TITER (ANA TITER)

## 2018-04-26 LAB — ANTI-SMOOTH MUSCLE ANTIBODY, IGG: Actin (Smooth Muscle) Antibody (IGG): <20 U (ref <20)

## 2018-04-26 LAB — ANA: Anti Nuclear Antibody(ANA): POSITIVE — AB

## 2018-04-26 LAB — CERULOPLASMIN: CERULOPLASMIN: 39 mg/dL (ref 18–53)

## 2018-04-26 LAB — ALPHA-1-ANTITRYPSIN: A-1 Antitrypsin, Ser: 203 mg/dL — ABNORMAL HIGH (ref 83–199)

## 2018-04-26 LAB — TISSUE TRANSGLUTAMINASE, IGA: (tTG) Ab, IgA: 1 U/mL

## 2018-04-26 LAB — MITOCHONDRIAL ANTIBODIES: MITOCHONDRIAL M2 AB, IGG: 61 U — AB

## 2018-04-27 ENCOUNTER — Encounter: Payer: Self-pay | Admitting: Gastroenterology

## 2018-04-28 ENCOUNTER — Encounter: Payer: Self-pay | Admitting: Gastroenterology

## 2018-04-29 ENCOUNTER — Encounter: Payer: Self-pay | Admitting: Internal Medicine

## 2018-04-29 NOTE — Progress Notes (Signed)
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 She should know that this is an autoimmune disorder that attacks bile ducts - at this point seems like things not too bad and the urso definitely helps  She is getting an [facility]inary [facility]ound tomorrow. Will see what that shows also Liver biopsy seems optional in her case I think.  I do want her to have an INR done (could do in 1 month with repeat LFT's) Explain diarrhea can occur with urso She can have an REV me (August is ok) Prognosis should be good  Also recheck a vit D level - has hx vit d deficiency and may have had fat malabsorption causing this  I would have her do a DEXA also - post-menopausal may be best dx to use  Have cced Dr. Fabian SharpPanosh

## 2018-04-30 ENCOUNTER — Ambulatory Visit (HOSPITAL_COMMUNITY)
Admission: RE | Admit: 2018-04-30 | Discharge: 2018-04-30 | Disposition: A | Discharge status: Home / Self Care | Payer: BLUE CROSS/BLUE SHIELD | Source: Ambulatory Visit | Attending: Gastroenterology | Admitting: Gastroenterology

## 2018-04-30 ENCOUNTER — Ambulatory Visit (HOSPITAL_COMMUNITY): Payer: BLUE CROSS/BLUE SHIELD

## 2018-04-30 DIAGNOSIS — R945 Abnormal results of liver function studies: Secondary | ICD-10-CM | POA: Insufficient documentation

## 2018-04-30 DIAGNOSIS — R7989 Other specified abnormal findings of blood chemistry: Secondary | ICD-10-CM

## 2018-04-30 DIAGNOSIS — R749 Abnormal serum enzyme level, unspecified: Secondary | ICD-10-CM | POA: Diagnosis not present

## 2018-05-01 ENCOUNTER — Telehealth: Payer: Self-pay | Admitting: Gastroenterology

## 2018-05-01 DIAGNOSIS — R7989 Other specified abnormal findings of blood chemistry: Secondary | ICD-10-CM

## 2018-05-01 DIAGNOSIS — R945 Abnormal results of liver function studies: Principal | ICD-10-CM

## 2018-05-01 DIAGNOSIS — K743 Primary biliary cirrhosis: Secondary | ICD-10-CM

## 2018-05-01 NOTE — Telephone Encounter (Signed)
Patient wanting results from US done yesterday 6.7.19.

## 2018-05-01 NOTE — Telephone Encounter (Signed)
Dana [name] the pt has called regarding US results.  Please advise

## 2018-05-04 ENCOUNTER — Ambulatory Visit: Payer: BLUE CROSS/BLUE SHIELD | Admitting: Gastroenterology

## 2018-05-04 MED ORDER — URSODIOL 500 MG PO TABS: 500.0000 mg | ORAL_TABLET | Freq: Two times a day (BID) | ORAL | 3 refills | Status: AC

## 2018-05-04 NOTE — Telephone Encounter (Signed)
Pt calling again regarding lab and US results.

## 2018-05-04 NOTE — Telephone Encounter (Signed)
Shanda BumpsJessica have you reviewed labs and US ?

## 2018-05-04 NOTE — Telephone Encounter (Signed)
The pt has been scheduled for bone density scan on 05/11/18 at 930 am tried to call pt back and no answer and voice mail is full .

## 2018-05-04 NOTE — Telephone Encounter (Signed)
Please let her know there ultrasound looks good but by her labs it looks like she has a condition called primary biliary cholangitis.  This is an autoimmune disorder that attacks bile ducts - at this point seems like things not too bad, however since ultrasound looks good, etc.  But the treatment is a medication called ursodiol and we would like to get her started on that.  The medication is usually very well tolerated without many side effects, but occasionally can cause some diarrhea.  Please prescribe at dose of 500 mg BID with food.  I have discussed this all with Dr. Leone PayorGessner and he is in agreement.  We would like her to get repeat LFT's in one month and please order a PT/INR and a Vitamin D level as well.  Needs a follow-up OV with Dr. Leone PayorGessner (he said August is fine).  He also wants her to have a bone density test (DEXA) as well due to her history of low Vitamin D-please also schedule that if agreeable.  Thank you,  Jess

## 2018-05-05 NOTE — Telephone Encounter (Signed)
The pt has been advised of the information and verbalized understanding.    

## 2018-05-07 ENCOUNTER — Telehealth: Payer: Self-pay | Admitting: Gastroenterology

## 2018-05-07 ENCOUNTER — Other Ambulatory Visit: Payer: Self-pay | Admitting: Internal Medicine

## 2018-05-07 NOTE — Telephone Encounter (Signed)
The pt wanted to confirm her US results were normal.  We discussed the req's per Shanda BumpsJessica on 05/01/18 (see below)  Please let her know there ultrasound looks good but by her labs it looks like she has a condition called primary biliary cholangitis.  This is an autoimmune disorder that attacks bile ducts - at this point seems like things not too bad, however since ultrasound looks good, etc.  But the treatment is a medication called ursodiol and we would like to get her started on that.  The medication is usually very well tolerated without many side effects, but occasionally can cause some diarrhea.  Please prescribe at dose of 500 mg BID with food.  I have discussed this all with Dr. Leone PayorGessner and he is in agreement.  We would like her to get repeat LFT's in one month and please order a PT/INR and a Vitamin D level as well.  Needs a follow-up OV with Dr. Leone PayorGessner (he said August is fine).  He also wants her to have a bone density test (DEXA) as well due to her history of low Vitamin D-please also schedule that if agreeable.  Thank you,  Jess   The pt has been advised of the information and verbalized understanding.

## 2018-05-08 DIAGNOSIS — M546 Pain in thoracic spine: Secondary | ICD-10-CM | POA: Diagnosis not present

## 2018-05-11 ENCOUNTER — Other Ambulatory Visit: Payer: BLUE CROSS/BLUE SHIELD

## 2018-05-21 ENCOUNTER — Ambulatory Visit: Payer: BLUE CROSS/BLUE SHIELD | Admitting: Gastroenterology

## 2018-06-03 ENCOUNTER — Other Ambulatory Visit (INDEPENDENT_AMBULATORY_CARE_PROVIDER_SITE_OTHER): Payer: BLUE CROSS/BLUE SHIELD

## 2018-06-03 DIAGNOSIS — K743 Primary biliary cirrhosis: Secondary | ICD-10-CM | POA: Diagnosis not present

## 2018-06-03 DIAGNOSIS — R945 Abnormal results of liver function studies: Secondary | ICD-10-CM | POA: Diagnosis not present

## 2018-06-03 DIAGNOSIS — R7989 Other specified abnormal findings of blood chemistry: Secondary | ICD-10-CM

## 2018-06-03 LAB — PROTIME-INR
INR: 1.1 ratio — ABNORMAL HIGH (ref 0.8–1.0)
PROTHROMBIN TIME: 12.4 s (ref 9.6–13.1)

## 2018-06-03 LAB — HEPATIC FUNCTION PANEL
ALT: 30 U/L (ref 0–35)
AST: 29 U/L (ref 0–37)
Albumin: 4 g/dL (ref 3.5–5.2)
Alkaline Phosphatase: 113 U/L (ref 39–117)
BILIRUBIN TOTAL: 0.5 mg/dL (ref 0.2–1.2)
Bilirubin, Direct: 0.1 mg/dL (ref 0.0–0.3)
Total Protein: 7.1 g/dL (ref 6.0–8.3)

## 2018-06-03 LAB — VITAMIN D 25 HYDROXY (VIT D DEFICIENCY, FRACTURES): VITD: 27.09 ng/mL — AB (ref 30.00–100.00)

## 2018-06-09 ENCOUNTER — Encounter: Payer: Self-pay | Admitting: Gastroenterology

## 2018-06-24 ENCOUNTER — Encounter: Payer: Self-pay | Admitting: Gastroenterology

## 2018-06-24 NOTE — Telephone Encounter (Signed)
Shanda BumpsJessica I am not sure how to answer this question.  Please advise?

## 2018-07-06 ENCOUNTER — Encounter: Payer: Self-pay | Admitting: Internal Medicine

## 2018-07-06 ENCOUNTER — Ambulatory Visit: Payer: BLUE CROSS/BLUE SHIELD | Admitting: Internal Medicine

## 2018-07-06 ENCOUNTER — Other Ambulatory Visit (INDEPENDENT_AMBULATORY_CARE_PROVIDER_SITE_OTHER): Payer: BLUE CROSS/BLUE SHIELD

## 2018-07-06 ENCOUNTER — Ambulatory Visit (INDEPENDENT_AMBULATORY_CARE_PROVIDER_SITE_OTHER)
Admission: RE | Admit: 2018-07-06 | Discharge: 2018-07-06 | Disposition: A | Discharge status: Home / Self Care | Payer: BLUE CROSS/BLUE SHIELD | Source: Ambulatory Visit | Attending: Internal Medicine | Admitting: Internal Medicine

## 2018-07-06 ENCOUNTER — Encounter

## 2018-07-06 VITALS — BP 132/82 | HR 79 | Ht 63.75 in | Wt 134.0 lb

## 2018-07-06 DIAGNOSIS — R1011 Right upper quadrant pain: Secondary | ICD-10-CM

## 2018-07-06 DIAGNOSIS — K743 Primary biliary cirrhosis: Secondary | ICD-10-CM | POA: Diagnosis not present

## 2018-07-06 DIAGNOSIS — M3500 Sicca syndrome, unspecified: Secondary | ICD-10-CM | POA: Diagnosis not present

## 2018-07-06 DIAGNOSIS — E559 Vitamin D deficiency, unspecified: Secondary | ICD-10-CM

## 2018-07-06 DIAGNOSIS — M8580 Other specified disorders of bone density and structure, unspecified site: Secondary | ICD-10-CM

## 2018-07-06 DIAGNOSIS — Z1231 Encounter for screening mammogram for malignant neoplasm of breast: Secondary | ICD-10-CM | POA: Diagnosis not present

## 2018-07-06 DIAGNOSIS — M859 Disorder of bone density and structure, unspecified: Secondary | ICD-10-CM

## 2018-07-06 LAB — COMPREHENSIVE METABOLIC PANEL
ALBUMIN: 4.3 g/dL (ref 3.5–5.2)
ALT: 21 U/L (ref 0–35)
AST: 23 U/L (ref 0–37)
Alkaline Phosphatase: 108 U/L (ref 39–117)
BUN: 14 mg/dL (ref 6–23)
CHLORIDE: 105 meq/L (ref 96–112)
CO2: 28 mEq/L (ref 19–32)
Calcium: 9.8 mg/dL (ref 8.4–10.5)
Creatinine, Ser: 0.97 mg/dL (ref 0.40–1.20)
GFR: 61.48 mL/min (ref >60.00)
Glucose, Bld: 98 mg/dL (ref 70–99)
POTASSIUM: 3.6 meq/L (ref 3.5–5.1)
SODIUM: 140 meq/L (ref 135–145)
Total Bilirubin: 0.5 mg/dL (ref 0.2–1.2)
Total Protein: 7.4 g/dL (ref 6.0–8.3)

## 2018-07-06 LAB — HM MAMMOGRAPHY

## 2018-07-06 MED ORDER — VITAMIN D (ERGOCALCIFEROL) 1.25 MG (50000 UNIT) PO CAPS: 50000.0000 [IU] | ORAL_CAPSULE | ORAL | 0 refills | Status: DC

## 2018-07-06 NOTE — Patient Instructions (Addendum)
Your provider has requested that you go to the basement level for lab work before leaving today. Press "B" on the elevator. The lab is located at the first door on the left as you exit the elevator.   We have sent the following medications to your pharmacy for you to pick up at your convenience: Vitamin D  Normal BMI (Body Mass Index- based on height and weight) is between 19 and 25. Your BMI today is Body mass index is 23.18 kg/m. Marland Kitchen. Please consider follow up  regarding your BMI with your Primary Care Provider.   Please also purchase over the counter Vitamin D 1000IU and take one daily along with your weekly Vitamin D 50, 000IU.  We have given you information to read on PBC.   Please stop by the x-ray department today and get your DEXA scan done today.   [facility]. Your appointment time is 1:50pm. Please arrive 15 minutes prior to your appointment time for registration purposes. Please make certain not to have anything to eat or drink 4 hours prior to your test. In addition, if you have any metal in your body, have a pacemaker or defibrillator, please be sure to let your ordering physician know. This test typically takes 45 minutes to 1 hour to complete. Should you need to reschedule, please call [phone] to do so.   I appreciate the opportunity to care for you. Stan Headarl  Gessner, MD, FACG g

## 2018-07-06 NOTE — Progress Notes (Signed)
Dana [name] 62 y.o. [birth year] [id number]  Assessment & Plan:   Encounter Diagnoses  Name Primary?  . Primary biliary cholangitis ([place]) Yes  . RUQ pain   . Vitamin D deficiency   . Sicca syndrome, unspecified ([place])   . Low bone density     Plan is for MRI MRCP of the liver. Vitamin D treatment as below. Meds ordered this encounter  Medications  . Vitamin D, Ergocalciferol, (DRISDOL) 50000 units CAPS capsule    Sig: Take 1 capsule (50,000 Units total) by mouth every 7 (seven) days. For 8 weeks and stop    Dispense:  8 capsule    Refill:  0   The MRI results have returned to the time of this dictation and there was some motion artifact but there were no abnormalities detected on the liver she has some lower lumbar degenerative disc disease and aortic atherosclerosis as well.  This is reassuring.  She has had a very nice biochemical response to the ursodeoxycholic acid and we will continue that.  I think it may be worth looking into the possibility of Sjogren's disease with her given her dry eyes and dry mouth.  There can be overlap with PBC and Sjogren's.  I do not think she has seen a rheumatologist but given at least one diagnosis of an autoimmune disease i.e. the primary biliary cholangitis I think it is worthwhile and we will refer.   DEXA scan shows low bone density, not osteoporosis her 10-year hip fracture risk is 2.4% major osteoporotic risk 11.9%.  We will continue with the vitamin D I recommended to thousand units daily in addition to the 8 weeks supplementation, I have also asked her to take about 5 Tums a day to increase her calcium intake.  I plan to recheck her blood work with LFTs and a vitamin D level in early November and she is aware.   I appreciate the opportunity to care for this patient. CC: Panosh, Standley Brooking, MD   Subjective:   Chief Complaint: Primary biliary cholangitis first visit with me  HPI The patient is here with her husband, after being  diagnosed with primary biliary cholangitis by abnormal mitochondrial antibody testing in the setting of an elevated alkaline phosphatase.  She was seen by Alonza Bogus, PA-C on Apr 22, 2018, Noting that she had a alkaline phosphatase and some occasional mild transaminase elevations.  Dr. Regis Bill sent her to [place].  Liver fraction was high on alk phos.  Ceruloplasmin ferritin celiac studies alpha-1 antitrypsin were all okay.  She did have a positive ANA with a dual pattern of homogeneous and multiple spots and the multiple spot titer was higher at 1-12 80 than the homogeneous at 1-80.  Mitochondrial antibodies which were negative in 2017 were now positive at 61.  Review of the current literature suggested to me that she did not need a biopsy to confirm this diagnosis.  Complete abdominal ultrasound done in June was normal.  Liver looked okay.  She was started on ursodeoxycholic acid and has felt okay since then.  I had sent a message for her to get an explanation of her problem but unfortunately she has not had that yet but I did review the nature etiology and clinical course of primary biliary cholangitis with her today.  She does suffer with dry eyes and mouth.  She has had elevated lipids also.  These can both be seen in [place].  Some fatigue but apparently not terrible.  Vitamin D  level was mildly low at 27 her INR was normal and alkaline phosphatase normalized.  She is having some mild intermittent right flank pain also.  Her work-up originally was triggered by right upper quadrant area pain that was thought to possibly be shingles without a rash.  That pain is improving.  Gabapentin has helped.   Allergies  Allergen Reactions  . Estrogens Other (See Comments)    Had stroke on HRT/OCP  . Prednisone Other (See Comments)    Jittery and irritable hard to sleep with oral pred  . Codeine     REACTION: nausea  . Ibuprofen     REACTION: nause   Current Meds  Medication Sig  . aspirin 81 MG tablet Take  81 mg by mouth daily.  Marland Kitchen atorvastatin (LIPITOR) 20 MG tablet Take 1 tablet (20 mg total) by mouth daily.  . cholecalciferol (VITAMIN D) 1000 units tablet Take 1,000 Units by mouth daily.  . cycloSPORINE (RESTASIS) 0.05 % ophthalmic emulsion Place 1 drop into both eyes every morning.  . gabapentin (NEURONTIN) 100 MG capsule Take  200 - 300 mg per night or as directed (Patient taking differently: 200 mg at bedtime. Take  200 - 300 mg per night or as directed)  . levothyroxine (SYNTHROID, LEVOTHROID) 75 MCG tablet Take 1 tablet (75 mcg total) by mouth daily.  . ursodiol (ACTIGALL) 250 MG tablet Take 250 mg by mouth 2 (two) times daily.   Past Medical History:  Diagnosis Date  . Abnormal finding on MRI of brain 01/10/2015  . CVA (cerebral infarction) 2010   diplopia neg acute MRi whit matter changes neg LP   . High blood pressure    readings  . High cholesterol   . Hx of varicella   . Low bone density 07/13/2018  . Migraine with aura    auras  . Non-cardiac chest pain    hops and evaluation 2011 dr Lia Foyer  . Other and unspecified hyperlipidemia 04/27/2012   Has been on meds for a number of years .  Low dose crestor .  Baseline reported as 280/   . PFO (patent foramen ovale)    Moderate size  . Pneumonia 2007   and flu hospitalized   . Primary biliary cholangitis ([place]) 04/08/2018  . Stroke [facility])    on hrt double vision- possible blood clot on brain; Dr. Floyde Parkins at [facility]   . Thyroid disease    Past Surgical History:  Procedure Laterality Date  . BREAST SURGERY     implants  . COLONOSCOPY    . FOOT SURGERY     had bunyon removed  . 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    Neg tad   Some caffiene    Seatbelts no ets fa stored    G1P1    Physically active     53 yo grandaughter    Patient is left handed.      family history includes Alcohol abuse in her mother; Breast cancer in her maternal aunt; Cancer in her mother. She was adopted.   Review of Systems As per HPI no urinary symptoms reported  Objective:   Physical Exam _0   132/82   Pulse 79   Ht 5' 3.75" (1.619 m)   Wt 134 lb (60.8 kg)   LMP 04/25/2009   BMI 23.18 kg/m @  General:  NAD Eyes:   anicteric Lungs:  clear Heart::  S1S2 no rubs, murmurs or gallops Abdomen:  soft and nontender, BS+ Ext:   no edema, cyanosis or clubbing    Data Reviewed:   See HPI

## 2018-07-06 NOTE — Assessment & Plan Note (Signed)
MR/MRCP CMET

## 2018-07-06 NOTE — Assessment & Plan Note (Signed)
50 k U weekly x 8 1000 IU qd DEXA

## 2018-07-12 ENCOUNTER — Ambulatory Visit
Admission: RE | Admit: 2018-07-12 | Discharge: 2018-07-12 | Disposition: A | Discharge status: Home / Self Care | Payer: BLUE CROSS/BLUE SHIELD | Source: Ambulatory Visit | Attending: Internal Medicine | Admitting: Internal Medicine

## 2018-07-12 DIAGNOSIS — E559 Vitamin D deficiency, unspecified: Secondary | ICD-10-CM | POA: Diagnosis not present

## 2018-07-12 DIAGNOSIS — R932 Abnormal findings on diagnostic imaging of liver and biliary tract: Secondary | ICD-10-CM | POA: Diagnosis not present

## 2018-07-12 DIAGNOSIS — K743 Primary biliary cirrhosis: Secondary | ICD-10-CM

## 2018-07-12 DIAGNOSIS — R1011 Right upper quadrant pain: Secondary | ICD-10-CM

## 2018-07-12 MED ORDER — GADOBENATE DIMEGLUMINE 529 MG/ML IV SOLN
12.0000 mL | Freq: Once | INTRAVENOUS | Status: AC | PRN
  Administered 2018-07-12: 12 mL via INTRAVENOUS

## 2018-07-13 ENCOUNTER — Other Ambulatory Visit: Payer: Self-pay

## 2018-07-13 ENCOUNTER — Encounter: Payer: Self-pay | Admitting: Internal Medicine

## 2018-07-13 DIAGNOSIS — M3500 Sicca syndrome, unspecified: Secondary | ICD-10-CM

## 2018-07-13 DIAGNOSIS — M810 Age-related osteoporosis without current pathological fracture: Secondary | ICD-10-CM

## 2018-07-13 DIAGNOSIS — M859 Disorder of bone density and structure, unspecified: Secondary | ICD-10-CM

## 2018-07-13 DIAGNOSIS — K743 Primary biliary cirrhosis: Secondary | ICD-10-CM

## 2018-07-13 DIAGNOSIS — M8580 Other specified disorders of bone density and structure, unspecified site: Secondary | ICD-10-CM

## 2018-07-13 HISTORY — DX: Disorder of bone density and structure, unspecified: M85.9

## 2018-07-13 HISTORY — DX: Age-related osteoporosis without current pathological fracture: M81.0

## 2018-07-13 HISTORY — DX: Other specified disorders of bone density and structure, unspecified site: M85.80

## 2018-07-13 NOTE — Progress Notes (Signed)
Results reviewed with patient.  She is being referred to rheumatology also because of sicca syndrome question Sjogren's overlap.

## 2018-07-13 NOTE — Progress Notes (Signed)
Results reviewed with patient see MRI and DEXA scan results

## 2018-07-13 NOTE — Progress Notes (Signed)
Results reviewed with patient, she needs a vitamin D level and LFTs ordered for early November she is aware to come then. She also needs referral to Dr. Loni Beckwitheveswhar of rheumatology because of sicca syndrome, primary biliary cholangitis question Sjogren's syndrome also

## 2018-07-13 NOTE — Progress Notes (Signed)
mb ref  

## 2018-07-13 NOTE — Addendum Note (Signed)
Addended by: Annett FabianJONES, Lanny Donoso L on: 07/13/2018 03:04 PM   Modules accepted: Orders

## 2018-07-14 ENCOUNTER — Encounter: Payer: Self-pay | Admitting: Internal Medicine

## 2018-07-29 DIAGNOSIS — H5789 Other specified disorders of eye and adnexa: Secondary | ICD-10-CM | POA: Diagnosis not present

## 2018-07-29 DIAGNOSIS — H16223 Keratoconjunctivitis sicca, not specified as Sjogren's, bilateral: Secondary | ICD-10-CM | POA: Diagnosis not present

## 2018-07-30 NOTE — Progress Notes (Signed)
Office Visit Note  Patient: Dana [name]             Date of Birth: Jun 16, 1956           MRN: [id number]             PCP: Burnis Medin, MD Referring: Burnis Medin, MD Visit Date: 08/11/2018 Occupation: Web designer  Subjective:  Dry mouth and dry eyes. +ANA.   History of Present Illness: Dana [name] is a 62 y.o. female seen in consultation per request of her PCP.  According to patient her symptoms started few months back with the tingling sensation on the right shoulder blade radiating to the front of her chest.  She states at that time Dr. Regis Bill gave her gabapentin.  The gabapentin helped her sleep but symptoms were not completely alleviated.  She was also referred to sports medicine who did some adjustments and also trigger point injection without much relief.  She has some lab work which showed elevated LFTs.  She was referred to Dr. Carlean Purl who diagnosed her with PBC.  Patient states after starting on medication her liver functions normalized.  And her shoulder joint pain is better.  She has been having some insomnia despite taking gabapentin.  She gives history of sicca symptoms for many years.  She has been treated with Restasis and eyedrops by her ophthalmologist.  She also gives history of dry mouth and dry skin.  She had recent labs done by Dr. Autumn Patty which came positive for ANA and was referred to me for further evaluation of possible Sjogren's.  Activities of Daily Living:  Patient reports morning stiffness for 0 minute.   Patient Denies nocturnal pain.  Difficulty dressing/grooming: Denies Difficulty climbing stairs: Denies Difficulty getting out of chair: Denies Difficulty using hands for taps, buttons, cutlery, and/or writing: Denies  Review of Systems  Constitutional: Negative for fatigue, night sweats, weight gain and weight loss.  HENT: Negative for mouth sores, trouble swallowing, trouble swallowing, mouth dryness and nose dryness.   Eyes: Negative  for pain, redness, visual disturbance and dryness.  Respiratory: Negative for cough, shortness of breath and difficulty breathing.   Cardiovascular: Negative for chest pain, palpitations, hypertension, irregular heartbeat and swelling in legs/feet.  Gastrointestinal: Positive for constipation. Negative for blood in stool and diarrhea.  Endocrine: Negative for increased urination.  Genitourinary: Negative for vaginal dryness.  Musculoskeletal: Negative for arthralgias, joint pain, joint swelling, myalgias, muscle weakness, morning stiffness, muscle tenderness and myalgias.  Skin: Negative for color change, rash, hair loss, skin tightness, ulcers and sensitivity to sunlight.  Allergic/Immunologic: Negative for susceptible to infections.  Neurological: Negative for dizziness, memory loss, night sweats and weakness.  Hematological: Negative for swollen glands.  Psychiatric/Behavioral: Positive for sleep disturbance. Negative for depressed mood. The patient is not nervous/anxious.     PMFS History:  Patient Active Problem List   Diagnosis Date Noted  . Onychomycosis 08/05/2018  . Sicca syndrome, unspecified ([place]) 07/13/2018  . Low bone density 07/13/2018  . Vitamin D deficiency 07/06/2018  . Primary biliary cholangitis ([place]) 04/08/2018  . Tetanus, diphtheria, and acellular pertussis (Tdap) vaccination declined 04/08/2018  . Trigger point of right shoulder region 03/24/2018  . Slipped rib syndrome 03/19/2018  . Nonallopathic lesion of rib cage 03/19/2018  . Nonallopathic lesion of thoracic region 03/19/2018  . Nonallopathic lesion of cervical region 03/19/2018  . Low serum vitamin D 04/09/2016  . PFO (patent foramen ovale)   . Abnormal finding on MRI of  brain 01/10/2015  . Visit for preventive health examination 11/24/2014  . Bilateral arm weakness  hx of  11/23/2014  . Elevated LFTs 11/23/2014  . Essential hypertension 11/23/2014  . Thyroid activity decreased 11/23/2014  . Hip pain,  acute 10/13/2013  . Left paraspinal back pain 10/13/2013  . Chronic constipation 09/16/2013  . Abdominal bloating 08/23/2013  . ESR raised 08/23/2013  . Right lower quadrant abdominal tenderness 04/05/2013  . Menopausal hot flushes 04/27/2012  . Other and unspecified hyperlipidemia 04/27/2012  . Migraine with aura   . Hyperlipidemia 01/03/2012  . CHEST PAIN, PRECORDIAL 03/22/2010  . Hypothyroidism 03/21/2010  . HYPERTENSION, UNSPECIFIED 03/21/2010  . CEREBROVASCULAR ACCIDENT, HX OF 03/21/2010    Past Medical History:  Diagnosis Date  . Abnormal finding on MRI of brain 01/10/2015  . CVA (cerebral infarction) 2010   diplopia neg acute MRi whit matter changes neg LP   . High blood pressure    readings  . High cholesterol   . Hx of varicella   . Low bone density 07/13/2018  . Migraine with aura    auras  . Non-cardiac chest pain    hops and evaluation 2011 dr Lia Foyer  . Other and unspecified hyperlipidemia 04/27/2012   Has been on meds for a number of years .  Low dose crestor .  Baseline reported as 280/   . PFO (patent foramen ovale)    Moderate size  . Pneumonia 2007   and flu hospitalized   . Primary biliary cholangitis ([place]) 04/08/2018  . Stroke [facility])    on hrt double vision- possible blood clot on brain; Dr. Floyde Parkins at [facility]   . Thyroid disease     Family History  Adopted: Yes  Problem Relation Age of Onset  . Cancer Mother        pancreatic  . Alcohol abuse Mother   . Breast cancer Maternal Aunt   . Healthy Daughter    Past Surgical History:  Procedure Laterality Date  . BREAST SURGERY     implants  . COLONOSCOPY    . FOOT SURGERY     had bunyon removed  . TONSILLECTOMY    . 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    Neg  tad   Some caffiene    Seatbelts no ets fa stored    G1P1   Physically active     35 yo grandaughter    Patient is left handed.       Objective: Vital Signs: BP 135/81 (BP Location: Right Arm, Patient Position: Sitting, Cuff Size: Normal)   Pulse 76   Resp 13   Ht 5' 4"  (1.626 m)   Wt 133 lb 9.6 oz (60.6 kg)   LMP 04/25/2009   BMI 22.93 kg/m    Physical Exam  Constitutional: She is oriented to person, place, and time. She appears well-developed and well-nourished.  HENT:  Head: Normocephalic and atraumatic.  Eyes: Conjunctivae and EOM are normal.  Neck: Normal range of motion.  Cardiovascular: Normal rate, regular rhythm, normal heart sounds and intact distal pulses.  Pulmonary/Chest: Effort normal and breath sounds normal.  Abdominal: Soft. Bowel sounds are normal.  Lymphadenopathy:    She has no cervical adenopathy.  Neurological: She is alert and oriented to person, place, and time.  Skin: Skin is warm and dry. Capillary refill takes less than 2 seconds.  Psychiatric: She has a normal mood and affect. Her behavior is normal.  Nursing note and vitals reviewed.    Musculoskeletal Exam: C-spine thoracic lumbar spine good range of motion.  Shoulder joints, elbow joints, wrist joints, MCPs and PIPs were in good range of motion with no synovitis.  Hip joints knee joints ankles MTPs PIPs and DIPs were in good range of motion with no synovitis.  CDAI Exam: CDAI Score: Not documented Patient Global Assessment: Not documented; Provider Global Assessment: Not documented Swollen: Not documented; Tender: Not documented Joint Exam   Not documented   There is currently no information documented on the homunculus. Go to the Rheumatology activity and complete the homunculus joint exam.  Investigation: No additional findings.  MULTIPLE NUCLEAR DOTS  Titer: >1:1280  .  Pattern:HOMOGENEOUS  Titer: 1:80  Component     Latest Ref Rng & Units 04/08/2018 04/22/2018  Alkaline  Phosphatase     39 - 117 IU/L 182 (H)   LIVER FRACTION     18 - 85 % 76   BONE FRACTION     14 - 68 % 24   INTESTINAL FRAC.     0 - 18 % 0   Iron     42 - 145 ug/dL  43  Transferrin     212.0 - 360.0 mg/dL  280.0  Saturation Ratios     20.0 - 50.0 %  11.0 (L)  ANA Pattern 1       DUAL (A)  ANA Titer 1     titer  SEE BELOW (A)  Anti Nuclear Antibody(ANA)     NEGATIVE  POSITIVE (A)  Ceruloplasmin     18 - 53 mg/dL  39  Mitochondrial M2 Ab, IgG     U  61.0 (H)  Actin (Smooth Muscle) Antibody (IGG)     <20 U  <20  A-1 Antitrypsin, Ser     83 - 199 mg/dL  203 (H)  IgA     68 - 378 mg/dL  133  (tTG) Ab, IgA     U/mL  1  Ferritin     10.0 - 291.0 ng/mL  122.9   Imaging: Mr Abdomen Mrcp W Wo Contast  Result Date: 07/13/2018 CLINICAL DATA:  Primary biliary cholangitis.   Right flank pain. EXAM: MRI ABDOMEN WITHOUT AND WITH CONTRAST (INCLUDING MRCP) TECHNIQUE: Multiplanar multisequence MR imaging of the abdomen was performed both before and after the administration of intravenous contrast. Heavily T2-weighted images of the biliary and pancreatic ducts were obtained, and three-dimensional MRCP images were rendered by post processing. CONTRAST:  71m MULTIHANCE GADOBENATE DIMEGLUMINE 529 MG/ML IV SOLN COMPARISON:  CT scan 04/06/2013 FINDINGS: Despite efforts by the technologist and patient, motion artifact is present on today's exam and could not be eliminated. This reduces exam sensitivity and specificity. Lower chest: Bilateral breast implants are observed. Hepatobiliary: No appreciable siderotic nodules, caudate lobe hypertrophy, or periportal halo or reticular fibrosis on T2 weighted images. No intrahepatic or extrahepatic biliary dilatation currently. No obvious active abnormal enhancement along the extrahepatic biliary tree. No abnormal enhancing lesion in the liver to suggest hepatocellular carcinoma. An indistinct 6 by 2 mm hypoenhancing lesion in the dome of the right hepatic lobe  for example on image 16/14 is probably a cyst or similar  benign lesion although technically nonspecific. Mildly lobular contour of the liver. No gallstones are observed. Pancreas:  Unremarkable Spleen:  Unremarkable.  No splenomegaly. Adrenals/Urinary Tract:  Unremarkable Stomach/Bowel: Unremarkable Vascular/Lymphatic: Aortoiliac atherosclerotic vascular disease. No significant adenopathy in the porta hepatis. Other:  No supplemental non-categorized findings. Musculoskeletal: Probable hemangioma in the T10 vertebral body. Degenerative disc disease at L4-5 at L5-S1 with mild levoconvex lumbar scoliosis. IMPRESSION: 1. No specific characteristic MRI findings of primary biliary cholangitis are currently identified. There is no evidence of hepatocellular carcinoma. A specific cause for the patient's right flank pain is not observed. 2. Lower lumbar degenerative disc disease. 3.  Aortic Atherosclerosis (ICD10-I70.0). Electronically Signed   By: Van Clines M.D.   On: 07/13/2018 08:28    Recent Labs: Lab Results  Component Value Date   WBC 10.4 03/11/2018   HGB 13.4 03/11/2018   PLT 275.0 03/11/2018   NA 140 07/06/2018   K 3.6 07/06/2018   CL 105 07/06/2018   CO2 28 07/06/2018   GLUCOSE 98 07/06/2018   BUN 14 07/06/2018   CREATININE 0.97 07/06/2018   BILITOT 0.5 07/06/2018   ALKPHOS 108 07/06/2018   AST 23 07/06/2018   ALT 21 07/06/2018   PROT 7.4 07/06/2018   ALBUMIN 4.3 07/06/2018   CALCIUM 9.8 07/06/2018   GFRAA  11/05/2010    >60        The eGFR has been calculated using the MDRD equation. This calculation has not been validated in all clinical situations. eGFR's persistently <60 mL/min signify possible Chronic Kidney Disease.    Speciality Comments: No specialty comments available.  Procedures:  No procedures performed Allergies: Estrogens; Prednisone; Codeine; and Ibuprofen   Assessment / Plan:     Visit Diagnoses: Sicca syndrome ([place]) -patient complains of sicca  symptoms going on for the last few years.  She is tried Restasis without results.  She has been using over-the-counter products.  She had positive ANA done by Dr. Carlean Purl.  I will obtain following labs today.  Over-the-counter products were discussed.  Increase intake of fluids was discussed.  We also discussed possible use of pilocarpine.  She wants to try pilocarpine.  Have given her prescription for Alcortin 5 mg p.o. 3 times daily PRN.  She will try half tablet and then increase as tolerated.  Positive ANA (antinuclear antibody) - ANA 1:1280 Nuclear dots and !:80 Homogeneous -I will obtainAVISE labs.  Plan: CBC with Differential/Platelet, Urinalysis, Routine w reflex microscopic, Sedimentation rate, Angiotensin converting enzyme, Serum protein electrophoresis with reflex, IgG, IgA, IgM, Hepatitis B core antibody, IgM, Hepatitis B surface antigen, Hepatitis C antibody, Hepatitis B core antibody, IgM, Hepatitis C antibody, Hepatitis B surface antigen, IgG, IgA, IgM, Serum protein electrophoresis with reflex, Angiotensin converting enzyme, Sedimentation rate, Urinalysis, Routine w reflex microscopic, CBC with Differential/Platelet  Primary biliary cholangitis ([facility])-her LFTs are normal now with medications.  She has been followed by Dr. Carlean Purl.  History of stroke  Patent foramen ovale  History of hypothyroidism  History of hyperlipidemia  Vitamin D deficiency -she is on vitamin D supplement.  Orders: Orders Placed This Encounter  Procedures  . CBC with Differential/Platelet  . Urinalysis, Routine w reflex microscopic  . Sedimentation rate  . Angiotensin converting enzyme  . Serum protein electrophoresis with reflex  . IgG, IgA, IgM  . Hepatitis B core antibody, IgM  . Hepatitis B surface antigen  . Hepatitis C antibody   Meds ordered this encounter  Medications  . pilocarpine (SALAGEN) 5 MG tablet  Sig: Take 1 tablet (5 mg total) by mouth 3 (three) times daily as needed.     Dispense:  90 tablet    Refill:  1    Face-to-face time spent with patient was 50 minutes. Greater than 50% of time was spent in counseling and coordination of care.  Follow-Up Instructions: Return for Sicca syndrome, positive ANA.   Bo Merino, MD  Note - This record has been created using Editor, commissioning.  Chart creation errors have been sought, but may not always  have been located. Such creation errors do not reflect on  the standard of medical care.

## 2018-08-04 ENCOUNTER — Encounter: Payer: Self-pay | Admitting: Podiatry

## 2018-08-04 ENCOUNTER — Ambulatory Visit: Payer: BLUE CROSS/BLUE SHIELD | Admitting: Podiatry

## 2018-08-04 DIAGNOSIS — B351 Tinea unguium: Secondary | ICD-10-CM | POA: Diagnosis not present

## 2018-08-05 DIAGNOSIS — B351 Tinea unguium: Secondary | ICD-10-CM | POA: Insufficient documentation

## 2018-08-05 NOTE — Progress Notes (Signed)
Subjective: 62 year old female presents the office today for concerns of toenail fungus the right third toe.  Also last saw her she use the topical antifungal through [facility] and did very well and she is asking for refill today to the right third toe.  She has not the toenail trimmed or touched and only asking for refill the medication.  She has no pain in the nail or redness or drainage or any other signs of infection. Denies any systemic complaints such as fevers, chills, nausea, vomiting. No acute changes since last appointment, and no other complaints at this time.   Objective: AAO x3, NAD DP/PT pulses palpable bilaterally, CRT less than 3 seconds Nose bradycardia much improved however the right third toenail is somewhat discolored with white discoloration of the toenail.  There is no pain in the nails there is no surrounding redness or drainage. No open lesions or pre-ulcerative lesions.  No pain with calf compression, swelling, warmth, erythema  Assessment: Onychomycosis   Plan: -All treatment options discussed with the patient including all alternatives, risks, complications.  -Discussed options.  Refill for antifungal medicine for her today.  Discussed application instructions and side effects.  She has no further questions or concerns today. -Patient encouraged to call the office with any questions, concerns, change in symptoms.   Vivi Barrack DPM

## 2018-08-11 ENCOUNTER — Ambulatory Visit: Payer: BLUE CROSS/BLUE SHIELD | Admitting: Rheumatology

## 2018-08-11 ENCOUNTER — Encounter: Payer: Self-pay | Admitting: Rheumatology

## 2018-08-11 VITALS — BP 135/81 | HR 76 | Resp 13 | Ht 64.0 in | Wt 133.6 lb

## 2018-08-11 DIAGNOSIS — K743 Primary biliary cirrhosis: Secondary | ICD-10-CM | POA: Diagnosis not present

## 2018-08-11 DIAGNOSIS — M3500 Sicca syndrome, unspecified: Secondary | ICD-10-CM | POA: Diagnosis not present

## 2018-08-11 DIAGNOSIS — Z8639 Personal history of other endocrine, nutritional and metabolic disease: Secondary | ICD-10-CM

## 2018-08-11 DIAGNOSIS — R768 Other specified abnormal immunological findings in serum: Secondary | ICD-10-CM | POA: Diagnosis not present

## 2018-08-11 DIAGNOSIS — Q211 Atrial septal defect: Secondary | ICD-10-CM

## 2018-08-11 DIAGNOSIS — E559 Vitamin D deficiency, unspecified: Secondary | ICD-10-CM

## 2018-08-11 DIAGNOSIS — Z8673 Personal history of transient ischemic attack (TIA), and cerebral infarction without residual deficits: Secondary | ICD-10-CM | POA: Diagnosis not present

## 2018-08-11 DIAGNOSIS — Q2112 Patent foramen ovale: Secondary | ICD-10-CM

## 2018-08-11 MED ORDER — PILOCARPINE HCL 5 MG PO TABS: 5.0000 mg | ORAL_TABLET | Freq: Three times a day (TID) | ORAL | 1 refills | Status: DC | PRN

## 2018-08-14 LAB — CBC WITH DIFFERENTIAL/PLATELET
Basophils Absolute: 48 cells/uL (ref 0–200)
Basophils Relative: 0.9 %
EOS ABS: 148 {cells}/uL (ref 15–500)
Eosinophils Relative: 2.8 %
HCT: 42.8 % (ref 35.0–45.0)
Hemoglobin: 14.1 g/dL (ref 11.7–15.5)
Lymphs Abs: 1102 cells/uL (ref 850–3900)
MCH: 30.1 pg (ref 27.0–33.0)
MCHC: 32.9 g/dL (ref 32.0–36.0)
MCV: 91.5 fL (ref 80.0–100.0)
MPV: 12.1 fL (ref 7.5–12.5)
Monocytes Relative: 11.5 %
NEUTROS PCT: 64 %
Neutro Abs: 3392 cells/uL (ref 1500–7800)
PLATELETS: 254 10*3/uL (ref 140–400)
RBC: 4.68 10*6/uL (ref 3.80–5.10)
RDW: 12.8 % (ref 11.0–15.0)
Total Lymphocyte: 20.8 %
WBC mixed population: 610 cells/uL (ref 200–950)
WBC: 5.3 10*3/uL (ref 3.8–10.8)

## 2018-08-14 LAB — PROTEIN ELECTROPHORESIS, SERUM, WITH REFLEX
ALBUMIN ELP: 4.1 g/dL (ref 3.8–4.8)
Alpha 1: 0.4 g/dL — ABNORMAL HIGH (ref 0.2–0.3)
Alpha 2: 0.8 g/dL (ref 0.5–0.9)
Beta 2: 0.4 g/dL (ref 0.2–0.5)
Beta Globulin: 0.5 g/dL (ref 0.4–0.6)
Gamma Globulin: 0.9 g/dL (ref 0.8–1.7)
TOTAL PROTEIN: 7.1 g/dL (ref 6.1–8.1)

## 2018-08-14 LAB — URINALYSIS, ROUTINE W REFLEX MICROSCOPIC
BILIRUBIN URINE: NEGATIVE
GLUCOSE, UA: NEGATIVE
Hgb urine dipstick: NEGATIVE
Ketones, ur: NEGATIVE
Leukocytes, UA: NEGATIVE
Nitrite: NEGATIVE
PH: 8 (ref 5.0–8.0)
Protein, ur: NEGATIVE
SPECIFIC GRAVITY, URINE: 1.013 (ref 1.001–1.03)

## 2018-08-14 LAB — HEPATITIS C ANTIBODY
HEP C AB: NONREACTIVE
SIGNAL TO CUT-OFF: 0.04 (ref <1.00)

## 2018-08-14 LAB — ANGIOTENSIN CONVERTING ENZYME: ANGIOTENSIN-CONVERTING ENZYME: 37 U/L (ref 9–67)

## 2018-08-14 LAB — SEDIMENTATION RATE: Sed Rate: 25 mm/h (ref 0–30)

## 2018-08-14 LAB — IGG, IGA, IGM
IMMUNOGLOBULIN A: 127 mg/dL (ref 20–320)
IgG (Immunoglobin G), Serum: 965 mg/dL (ref 600–1540)
IgM, Serum: 216 mg/dL (ref 50–300)

## 2018-08-14 LAB — IFE INTERPRETATION: Immunofix Electr Int: NOT DETECTED

## 2018-08-14 LAB — HEPATITIS B CORE ANTIBODY, IGM: Hep B C IgM: NONREACTIVE

## 2018-08-14 LAB — HEPATITIS B SURFACE ANTIGEN: Hepatitis B Surface Ag: NONREACTIVE

## 2018-08-14 NOTE — Progress Notes (Signed)
We will discuss at the follow-up visit.

## 2018-08-20 ENCOUNTER — Encounter: Payer: Self-pay | Admitting: Rheumatology

## 2018-08-20 NOTE — Telephone Encounter (Signed)
Spoke with patient and schedule patient for a sooner new patient follow up appointment

## 2018-08-22 ENCOUNTER — Other Ambulatory Visit: Payer: Self-pay | Admitting: Gastroenterology

## 2018-08-24 NOTE — Progress Notes (Signed)
Office Visit Note  Patient: Dana [name]             Date of Birth: 23-Aug-1956           MRN: [id number]             PCP: Burnis Medin, MD Referring: Burnis Medin, MD Visit Date: 08/27/2018 Occupation: @GUAROCC @  Subjective:  Dry mouth and dry eyes.   History of Present Illness: Dana [name] is a 62 y.o. female with Sjogren's syndrome.  She states she continues to have dry mouth and dry eyes.  She has been also having intermittent pain in her left wrist.  She has been having some tingling sensation between her shoulder blades.  She denies any joint swelling.  Activities of Daily Living:  Patient reports morning stiffness for 0 minute.   Patient Denies nocturnal pain.  Difficulty dressing/grooming: Denies Difficulty climbing stairs: Denies Difficulty getting out of chair: Denies Difficulty using hands for taps, buttons, cutlery, and/or writing: Denies  Review of Systems  Constitutional: Positive for fatigue. Negative for night sweats, weight gain and weight loss.  HENT: Positive for mouth dryness. Negative for mouth sores, trouble swallowing, trouble swallowing and nose dryness.   Eyes: Positive for dryness. Negative for pain, redness and visual disturbance.  Respiratory: Negative for cough, shortness of breath and difficulty breathing.   Cardiovascular: Negative for chest pain, palpitations, hypertension, irregular heartbeat and swelling in legs/feet.  Gastrointestinal: Positive for constipation. Negative for blood in stool and diarrhea.  Endocrine: Negative for increased urination.  Genitourinary: Negative for vaginal dryness.  Musculoskeletal: Positive for arthralgias and joint pain. Negative for joint swelling, myalgias, muscle weakness, morning stiffness, muscle tenderness and myalgias.  Skin: Negative for color change, rash, hair loss, skin tightness, ulcers and sensitivity to sunlight.  Allergic/Immunologic: Negative for susceptible to infections.  Neurological:  Negative for dizziness, memory loss, night sweats and weakness.  Hematological: Negative for swollen glands.  Psychiatric/Behavioral: Negative for depressed mood and sleep disturbance. The patient is not nervous/anxious.     PMFS History:  Patient Active Problem List   Diagnosis Date Noted  . Onychomycosis 08/05/2018  . Sicca syndrome, unspecified ([place]) 07/13/2018  . Low bone density 07/13/2018  . Vitamin D deficiency 07/06/2018  . Primary biliary cholangitis ([place]) 04/08/2018  . Tetanus, diphtheria, and acellular pertussis (Tdap) vaccination declined 04/08/2018  . Trigger point of right shoulder region 03/24/2018  . Slipped rib syndrome 03/19/2018  . Nonallopathic lesion of rib cage 03/19/2018  . Nonallopathic lesion of thoracic region 03/19/2018  . Nonallopathic lesion of cervical region 03/19/2018  . Low serum vitamin D 04/09/2016  . PFO (patent foramen ovale)   . Abnormal finding on MRI of brain 01/10/2015  . Visit for preventive health examination 11/24/2014  . Bilateral arm weakness  hx of  11/23/2014  . Elevated LFTs 11/23/2014  . Essential hypertension 11/23/2014  . Thyroid activity decreased 11/23/2014  . Hip pain, acute 10/13/2013  . Left paraspinal back pain 10/13/2013  . Chronic constipation 09/16/2013  . Abdominal bloating 08/23/2013  . ESR raised 08/23/2013  . Right lower quadrant abdominal tenderness 04/05/2013  . Menopausal hot flushes 04/27/2012  . Other and unspecified hyperlipidemia 04/27/2012  . Migraine with aura   . Hyperlipidemia 01/03/2012  . CHEST PAIN, PRECORDIAL 03/22/2010  . Hypothyroidism 03/21/2010  . HYPERTENSION, UNSPECIFIED 03/21/2010  . CEREBROVASCULAR ACCIDENT, HX OF 03/21/2010    Past Medical History:  Diagnosis Date  . Abnormal finding on MRI of brain 01/10/2015  .  CVA (cerebral infarction) 2010   diplopia neg acute MRi whit matter changes neg LP   . High blood pressure    readings  . High cholesterol   . Hx of varicella   . Low  bone density 07/13/2018  . Migraine with aura    auras  . Non-cardiac chest pain    hops and evaluation 2011 dr Lia Foyer  . Other and unspecified hyperlipidemia 04/27/2012   Has been on meds for a number of years .  Low dose crestor .  Baseline reported as 280/   . PFO (patent foramen ovale)    Moderate size  . Pneumonia 2007   and flu hospitalized   . Primary biliary cholangitis ([place]) 04/08/2018  . Stroke [facility])    on hrt double vision- possible blood clot on brain; Dr. Floyde Parkins at [facility]   . Thyroid disease     Family History  Adopted: Yes  Problem Relation Age of Onset  . Cancer Mother        pancreatic  . Alcohol abuse Mother   . Breast cancer Maternal Aunt   . Healthy Daughter    Past Surgical History:  Procedure Laterality Date  . BREAST SURGERY     implants  . COLONOSCOPY    . FOOT SURGERY     had bunyon removed  . TONSILLECTOMY    . 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    Neg tad   Some caffiene    Seatbelts no ets fa stored    G1P1   Physically active     17 yo grandaughter    Patient is left handed.       Objective: Vital Signs: BP 114/76 (BP Location: Left Arm, Patient Position: Sitting, Cuff Size: Normal)   Pulse 76   Resp 16   Ht 5' 4"  (1.626 m)   Wt 135 lb (61.2 kg)   LMP 04/25/2009   BMI 23.17 kg/m    Physical Exam  Constitutional: She is oriented to person, place, and time. She appears well-developed and well-nourished.  HENT:  Head: Normocephalic and atraumatic.  Eyes: Conjunctivae and EOM are normal.  Neck: Normal range of motion.  Cardiovascular: Normal rate, regular rhythm, normal heart sounds and intact distal pulses.  Pulmonary/Chest: Effort normal and breath sounds normal.  Abdominal: Soft. Bowel  sounds are normal.  Lymphadenopathy:    She has no cervical adenopathy.  Neurological: She is alert and oriented to person, place, and time.  Skin: Skin is warm and dry. Capillary refill takes less than 2 seconds.  Psychiatric: She has a normal mood and affect. Her behavior is normal.  Nursing note and vitals reviewed.    Musculoskeletal Exam: C-spine thoracic lumbar spine good range of motion.  Shoulder joints elbow joints wrist joint MCPs PIPs DIPs were in good range of motion with no synovitis.  Hip joints knee joints ankles MTPs PIPs DIPs been good range of motion with no synovitis.  CDAI Exam: CDAI Score: Not documented Patient Global Assessment: Not documented; Provider Global Assessment: Not documented Swollen: Not documented; Tender: Not documented Joint Exam   Not documented   There is currently no  information documented on the homunculus. Go to the Rheumatology activity and complete the homunculus joint exam.  Investigation: Findings:  August 11, 2018 AVISE index 0.2, ANA 1: 5120 homogeneous, SSA positive, dsDNA negative, Smith negative, RNP negative, SSB Negative, SCL 70 is negative, centromere negative, Jo 1-, CB CAP negative, anticardiolipin negative, beta-2 GP 1-, RF positive, anti-CCP negative, antithyroglobulin negative, antithyroid peroxidase negative   Imaging: No results found.  Recent Labs: Lab Results  Component Value Date   WBC 5.3 08/11/2018   HGB 14.1 08/11/2018   PLT 254 08/11/2018   NA 140 07/06/2018   K 3.6 07/06/2018   CL 105 07/06/2018   CO2 28 07/06/2018   GLUCOSE 98 07/06/2018   BUN 14 07/06/2018   CREATININE 0.97 07/06/2018   BILITOT 0.5 07/06/2018   ALKPHOS 108 07/06/2018   AST 23 07/06/2018   ALT 21 07/06/2018   PROT 7.1 08/11/2018   ALBUMIN 4.3 07/06/2018   CALCIUM 9.8 07/06/2018   GFRAA  11/05/2010    >60        The eGFR has been calculated using the MDRD equation. This calculation has not been validated in all  clinical situations. eGFR's persistently <60 mL/min signify possible Chronic Kidney Disease.  August 11, 2018 IFE negative, hepatitis B negative, hepatitis C negative, immunoglobulins normal UA negative, ESR 25, ACE 37  Speciality Comments: No specialty comments available.  Procedures:  No procedures performed Allergies: Estrogens; Prednisone; Codeine; and Ibuprofen   Assessment / Plan:     Visit Diagnoses: Sjogren's syndrome with keratoconjunctivitis sicca ([place]) - Prescription for pilocarpine was given last visit.  She has noticed improvement in her dry eyes and dry mouth symptoms.  She is also on Restasis.  Positive ANA, positive Ro, positive RF.  Detailed counseling was provided.  Labs were discussed at length.  We discussed possible benefit from Plaquenil as regards to her Sjogren's symptoms.  Indications side effects contraindications of the medication were discussed.  She is ready to proceed with Plaquenil.  Handout was given and consent was taken.  We will start her on Plaquenil 200 mg p.o. twice daily Monday through Friday.  She will need a baseline eye examination and then eye examination on yearly basis.  She is seen also advised to get pneumococcal, Shingrix and flu vaccine.  We will check her labs which will include CBC and CMP in 1 month and then every 3 months to monitor for drug toxicity.  Patient was counseled on the purpose, proper use, and adverse effects of hydroxychloroquine including nausea/diarrhea, skin rash, headaches, and sun sensitivity.  Discussed importance of annual eye exams while on hydroxychloroquine to monitor to ocular toxicity and discussed importance of frequent laboratory monitoring.  Provided patient with eye exam form for baseline ophthalmologic exam.  Provided patient with educational materials on hydroxychloroquine and answered all questions.  Patient consented to hydroxychloroquine.  Will upload consent in the media tab.      Positive ANA  (antinuclear antibody) - ANA 1: 5120 homogeneous, SSA positive  Primary biliary cholangitis ([facility]) - On Ursodiol by Dr. Carlean Purl  Vitamin D deficiency -she is on vitamin D supplement.  History of stroke  Patent foramen ovale  History of hypothyroidism  History of hyperlipidemia  Orders: No orders of the defined types were placed in this encounter.  Meds ordered this encounter  Medications  . hydroxychloroquine (PLAQUENIL) 200 MG tablet    Sig: Take 233m by mouth twice daily, Monday-Friday only. None on Saturday or Sunday.    Dispense:  40 tablet    Refill:  0    Face-to-face time spent with patient was 30 minutes. Greater than 50% of time was spent in counseling and coordination of care.  Follow-Up Instructions: Return in about 3 months (around 11/27/2018) for Sjogren's.   Bo Merino, MD  Note - This record has been created using Editor, commissioning.  Chart creation errors have been sought, but may not always  have been located. Such creation errors do not reflect on  the standard of medical care.

## 2018-08-25 ENCOUNTER — Telehealth: Payer: Self-pay | Admitting: Internal Medicine

## 2018-08-25 MED ORDER — URSODIOL 250 MG PO TABS: 250.0000 mg | ORAL_TABLET | Freq: Two times a day (BID) | ORAL | 11 refills | Status: DC

## 2018-08-25 NOTE — Telephone Encounter (Signed)
In patient's last office note from August it says to continue her ursodiol so it has been refilled as requested.

## 2018-08-27 ENCOUNTER — Ambulatory Visit: Payer: BLUE CROSS/BLUE SHIELD | Admitting: Rheumatology

## 2018-08-27 ENCOUNTER — Ambulatory Visit: Payer: BLUE CROSS/BLUE SHIELD

## 2018-08-27 ENCOUNTER — Encounter: Payer: Self-pay | Admitting: Physician Assistant

## 2018-08-27 VITALS — BP 114/76 | HR 76 | Resp 16 | Ht 64.0 in | Wt 135.0 lb

## 2018-08-27 DIAGNOSIS — R768 Other specified abnormal immunological findings in serum: Secondary | ICD-10-CM

## 2018-08-27 DIAGNOSIS — M3501 Sicca syndrome with keratoconjunctivitis: Secondary | ICD-10-CM | POA: Diagnosis not present

## 2018-08-27 DIAGNOSIS — K743 Primary biliary cirrhosis: Secondary | ICD-10-CM | POA: Diagnosis not present

## 2018-08-27 DIAGNOSIS — E559 Vitamin D deficiency, unspecified: Secondary | ICD-10-CM | POA: Diagnosis not present

## 2018-08-27 MED ORDER — HYDROXYCHLOROQUINE SULFATE 200 MG PO TABS: ORAL_TABLET | ORAL | 0 refills | Status: DC

## 2018-08-27 NOTE — Patient Instructions (Addendum)
Hydroxychloroquine tablets What is this medicine? HYDROXYCHLOROQUINE (hye drox ee KLOR oh kwin) is used to treat rheumatoid arthritis and systemic lupus erythematosus. It is also used to treat malaria. This medicine may be used for other purposes; ask your health care provider or pharmacist if you have questions. COMMON BRAND NAME(S): Plaquenil, Quineprox What should I tell my health care provider before I take this medicine? They need to know if you have any of these conditions: -diabetes -eye disease, vision problems -G6PD deficiency -history of blood diseases -history of irregular heartbeat -if you often drink alcohol -kidney disease -liver disease -porphyria -psoriasis -seizures -an unusual or allergic reaction to chloroquine, hydroxychloroquine, other medicines, foods, dyes, or preservatives -pregnant or trying to get pregnant -breast-feeding How should I use this medicine? Take this medicine by mouth with a glass of water. Follow the directions on the prescription label. Avoid taking antacids within 4 hours of taking this medicine. It is best to separate these medicines by at least 4 hours. Do not cut, crush or chew this medicine. You can take it with or without food. If it upsets your stomach, take it with food. Take your medicine at regular intervals. Do not take your medicine more often than directed. Take all of your medicine as directed even if you think you are better. Do not skip doses or stop your medicine early. Talk to your pediatrician regarding the use of this medicine in children. While this drug may be prescribed for selected conditions, precautions do apply. Overdosage: If you think you have taken too much of this medicine contact a poison control center or emergency room at once. NOTE: This medicine is only for you. Do not share this medicine with others. What if I miss a dose? If you miss a dose, take it as soon as you can. If it is almost time for your next dose,  take only that dose. Do not take double or extra doses. What may interact with this medicine? Do not take this medicine with any of the following medications: -cisapride -dofetilide -dronedarone -live virus vaccines -penicillamine -pimozide -thioridazine -ziprasidone This medicine may also interact with the following medications: -ampicillin -antacids -cimetidine -cyclosporine -digoxin -medicines for diabetes, like insulin, glipizide, glyburide -medicines for seizures like carbamazepine, phenobarbital, phenytoin -mefloquine -methotrexate -other medicines that prolong the QT interval (cause an abnormal heart rhythm) -praziquantel This list may not describe all possible interactions. Give your health care provider a list of all the medicines, herbs, non-prescription drugs, or dietary supplements you use. Also tell them if you smoke, drink alcohol, or use illegal drugs. Some items may interact with your medicine. What should I watch for while using this medicine? Tell your doctor or healthcare professional if your symptoms do not start to get better or if they get worse. Avoid taking antacids within 4 hours of taking this medicine. It is best to separate these medicines by at least 4 hours. Tell your doctor or health care professional right away if you have any change in your eyesight. Your vision and blood may be tested before and during use of this medicine. This medicine can make you more sensitive to the sun. Keep out of the sun. If you cannot avoid being in the sun, wear protective clothing and use sunscreen. Do not use sun lamps or tanning beds/booths. What side effects may I notice from receiving this medicine? Side effects that you should report to your doctor or health care professional as soon as possible: -allergic reactions like skin rash,  itching or hives, swelling of the face, lips, or tongue -changes in vision -decreased hearing or ringing of the ears -redness,  blistering, peeling or loosening of the skin, including inside the mouth -seizures -sensitivity to light -signs and symptoms of a dangerous change in heartbeat or heart rhythm like chest pain; dizziness; fast or irregular heartbeat; palpitations; feeling faint or lightheaded, falls; breathing problems -signs and symptoms of liver injury like dark yellow or brown urine; general ill feeling or flu-like symptoms; light-colored stools; loss of appetite; nausea; right upper belly pain; unusually weak or tired; yellowing of the eyes or skin -signs and symptoms of low blood sugar such as feeling anxious; confusion; dizziness; increased hunger; unusually weak or tired; sweating; shakiness; cold; irritable; headache; blurred vision; fast heartbeat; loss of consciousness -uncontrollable head, mouth, neck, arm, or leg movements Side effects that usually do not require medical attention (report to your doctor or health care professional if they continue or are bothersome): -anxious -diarrhea -dizziness -hair loss -headache -irritable -loss of appetite -nausea, vomiting -stomach pain This list may not describe all possible side effects. Call your doctor for medical advice about side effects. You may report side effects to FDA at 1-800-FDA-1088. Where should I keep my medicine? Keep out of the reach of children. In children, this medicine can cause overdose with small doses. Store at room temperature between 15 and 30 degrees C (59 and 86 degrees F). Protect from moisture and light. Throw away any unused medicine after the expiration date. NOTE: This sheet is a summary. It may not cover all possible information. If you have questions about this medicine, talk to your doctor, pharmacist, or health care provider.  2018 Elsevier/Gold Standard (2016-06-26 14:16:15)   Sjogren Syndrome Sjogren syndrome is a disease in which the body's disease-fighting system (immune system) attacks the glands that produce  tears (lacrimal glands) and the glands that produce saliva (salivary glands). This makes the eyes and mouth very dry. Sjogren syndrome is a long-term (chronic) disorder that has no cure. In some cases, it is linked to other disorders (rheumatic disorders), such as rheumatoid arthritis and systemic lupus erythematosus (SLE). It may affect other parts of the body, such as:  Kidneys.  Blood vessels.  Joints.  Lungs.  Liver.  Pancreas.  Brain.  Nerves.  Spinal cord.  What are the causes? The cause of this condition is not known. It may be passed along from parent to child (inherited), or it may be a symptom of a rheumatic disorder. What increases the risk? This condition is more likely to develop in:  Women.  People who are 68-58 years old.  People who have recently had a viral infection or currently have a viral infection.  What are the signs or symptoms? The main symptoms of this condition are:  Dry mouth. This may include: ? A chalky feeling. ? Difficulty swallowing, speaking, or tasting. ? Frequent cavities in teeth. ? Frequent mouth infections.  Dry eyes. This may include: ? Burning, redness, and itching. ? Blurry vision. ? Light sensitivity.  Other symptoms may include:  Dryness of the skin and the inside of the nose.  Eyelid infections.  Vaginal dryness, if this applies.  Joint pain and stiffness.  Muscle pain and stiffness.  How is this diagnosed? This condition is diagnosed based on:  Your symptoms.  Your medical history.  A physical exam of your eyes and mouth.  You may have tests, including:  Schirmer test. This tests your tear production.  An eye  exam that is done with a magnifying device (slit-lamp exam).  An eye test that temporarily stains your eye with dye. This shows the extent of eye damage.  Tests to check your salivary gland function.  Biopsy. This is a removal of part of a salivary gland from inside your lower lip to be  studied under a microscope.  Chest X-rays.  Blood tests.  Urine tests.  How is this treated? There is no cure for this condition, but treatment can help you manage your symptoms. Treatment options may include:  Moisture replacement therapies to help relieve dryness in your skin, mouth, and eyes.  NSAIDs to help relieve pain and stiffness.  Medicines to help relieve inflammation in your body(corticosteroids). These are usually for severe cases.  Medicines to help reduce the activity of your immune system (immunosuppressants).  Surgery or insertion of plugs to close the lacrimal glands (punctal occlusion). Thishelps keep more natural tears in your eyes.  Follow these instructions at home:  Take over-the-counter and prescription medicines only as told by your health care provider.  Take these actions to care for your eyes: ? Use eye drops as told by your health care provider. ? Blink at least 5-6 times a minute. ? Protect your eyes from drafts and breezes. ? Maintain properly humidified air. You may want to use a humidifier at home. ? Avoid smoke.  Take these actions to care for your mouth: ? Brush your teeth and floss after every meal. ? Chew sugar-free gum or suck on hard candy. For some people, this can help to relieve dry mouth. ? Use antimicrobial mouthwash daily. ? Take frequent sips of water or sugar-free drinks. ? Use saliva substitutes or lip balm as told by your health care provider.  Drink enough fluid to keep your urine clear or pale yellow.  Schedule and attend dentist visits every six months.  Keep all follow-up visits as told by your health care provider. This is important. Contact a health care provider if:  You have a fever.  You have night sweats.  You are always tired.  You have unexplained weight loss.  You develop itchy skin.  You have red patches on your skin.  You have a lump or swelling on your neck. This information is not intended to  replace advice given to you by your health care provider. Make sure you discuss any questions you have with your health care provider. Document Released: 11/01/2002 Document Revised: 07/07/2016 Document Reviewed: 07/20/2015 Elsevier Interactive Patient Education  2018 [employer].  [employer] We placed an order today for your standing lab work.    Please come back and get your standing labs in 1 month and then every 3 months after starting Plaquenil  We have open lab Monday through Friday from 8:30-11:30 AM and 1:30-4:00 PM  at the office of Dr. Pollyann Savoy.   You may experience shorter wait times on Monday and Friday afternoons. The office is located at [address], [address], [place], [place] [address] No appointment is necessary.   Labs are drawn by [facility].  You may receive a bill from [facility] for your lab work. If you have any questions regarding directions or hours of operation,  please call [phone].   Just as a reminder please drink plenty of water prior to coming for your lab work. Thanks!  Please get baseline eye examination with your ophthalmologist and then every year.  Please get following immunization with your PCP: Pneumococcal vaccine, flu vaccine, Shingrix  vaccine

## 2018-08-28 ENCOUNTER — Telehealth: Payer: Self-pay | Admitting: *Deleted

## 2018-08-28 NOTE — Telephone Encounter (Signed)
Go ahead and give her the pneumo 23 thanks

## 2018-08-28 NOTE — Telephone Encounter (Signed)
Patient had 1 dose of Prevnar 13 in 2012. Does she need Pneumovax 23 prior to age 62?  I do not see any indication on Problem List besides possibly PFO.

## 2018-08-28 NOTE — Telephone Encounter (Signed)
Copied from CRM [id number]. Topic: General - Other >> Aug 28, 2018  9:31 AM Percival Spanish wrote:  Pt is asking if she can get her  pneumonia shot when she get her flu shot on 09/08/18

## 2018-08-28 NOTE — Telephone Encounter (Signed)
Appt notes updated

## 2018-09-02 ENCOUNTER — Telehealth: Payer: Self-pay | Admitting: Internal Medicine

## 2018-09-02 NOTE — Telephone Encounter (Signed)
Patient was seen by Dr. Corliss Skains and found to have Sjogren's Syndrome.  She is questioning if she has Sjogren's and not PBC all along .  She is taking the ursodiol.  She is not wanting to continue unless absolutely necessary.  Does she need an appt to discuss further?

## 2018-09-02 NOTE — Telephone Encounter (Signed)
I believe she has both and should stay on the ursodiol. These disorders can and do coexist.  It is safe and effective.  If she has specific concerns otherwise she can schedule an OV to review

## 2018-09-03 NOTE — Telephone Encounter (Signed)
Patient notified of the recommendations She is okay to continue PBC tx.  She will call back if she has additional questions

## 2018-09-08 ENCOUNTER — Telehealth: Payer: Self-pay | Admitting: Podiatry

## 2018-09-08 ENCOUNTER — Ambulatory Visit (INDEPENDENT_AMBULATORY_CARE_PROVIDER_SITE_OTHER): Payer: BLUE CROSS/BLUE SHIELD

## 2018-09-08 DIAGNOSIS — Z23 Encounter for immunization: Secondary | ICD-10-CM | POA: Diagnosis not present

## 2018-09-08 NOTE — Telephone Encounter (Signed)
Patient is interested in Laser treatment, just wondering if you think her nails are too thin for the treatment. Because you didn't mention that as a option.

## 2018-09-09 ENCOUNTER — Telehealth: Payer: Self-pay | Admitting: Podiatry

## 2018-09-09 NOTE — Telephone Encounter (Signed)
Patient called again interested in Laser treatment, just wondering if you think her nails are too thin for the treatment. Because you didn't mention that as a option.

## 2018-09-10 NOTE — Telephone Encounter (Signed)
Called patient already today. Dana [name]

## 2018-09-10 NOTE — Telephone Encounter (Signed)
She should be OK to start Laser but we can check when she comes in. If she wants to have it done, please go ahead and schedule her with JQ. Thanks.

## 2018-09-10 NOTE — Telephone Encounter (Signed)
Patient called a few days ago and wanted to know could she get the laser treatment and was concerned with the 1/4 of a nail that she has left and the lacquer seems to be helping and I called patient back today and stated per Dr Ardelle Anton as long as there is a nail then the laser can be done and patient stated that there is some discoloration to one of the toenails and I stated that we could get Dr Ardelle Anton to see patient on the same day as doing the laser and I told patient I would call her back about an appointment. Misty Stanley

## 2018-09-11 DIAGNOSIS — L570 Actinic keratosis: Secondary | ICD-10-CM | POA: Diagnosis not present

## 2018-09-11 DIAGNOSIS — L905 Scar conditions and fibrosis of skin: Secondary | ICD-10-CM | POA: Diagnosis not present

## 2018-09-11 DIAGNOSIS — X32XXXD Exposure to sunlight, subsequent encounter: Secondary | ICD-10-CM | POA: Diagnosis not present

## 2018-09-11 DIAGNOSIS — L820 Inflamed seborrheic keratosis: Secondary | ICD-10-CM | POA: Diagnosis not present

## 2018-09-11 DIAGNOSIS — L821 Other seborrheic keratosis: Secondary | ICD-10-CM | POA: Diagnosis not present

## 2018-09-21 ENCOUNTER — Encounter: Payer: Self-pay | Admitting: Rheumatology

## 2018-09-21 ENCOUNTER — Other Ambulatory Visit: Payer: Self-pay | Admitting: Rheumatology

## 2018-09-21 NOTE — Telephone Encounter (Signed)
Last Visit: 08/27/18 Next visit: 12/01/18 Labs: 08/11/18 cbc wnl 07/06/18 cmp wnl Patient will have her PLQ eye exam on 09/25/18. Will have form sent to our office.   Okay to refill 30 day supply per Dr. Corliss Skains

## 2018-09-22 ENCOUNTER — Ambulatory Visit: Payer: BLUE CROSS/BLUE SHIELD | Admitting: Physician Assistant

## 2018-09-25 DIAGNOSIS — H04123 Dry eye syndrome of bilateral lacrimal glands: Secondary | ICD-10-CM | POA: Diagnosis not present

## 2018-09-25 DIAGNOSIS — H2513 Age-related nuclear cataract, bilateral: Secondary | ICD-10-CM | POA: Diagnosis not present

## 2018-09-25 DIAGNOSIS — M3500 Sicca syndrome, unspecified: Secondary | ICD-10-CM | POA: Diagnosis not present

## 2018-09-25 DIAGNOSIS — Z79899 Other long term (current) drug therapy: Secondary | ICD-10-CM | POA: Diagnosis not present

## 2018-09-30 ENCOUNTER — Telehealth: Payer: Self-pay | Admitting: Internal Medicine

## 2018-09-30 DIAGNOSIS — K743 Primary biliary cirrhosis: Secondary | ICD-10-CM

## 2018-09-30 NOTE — Telephone Encounter (Signed)
Patient advised that I will call her as soon as Dr. Leone Payor can review and advise.

## 2018-09-30 NOTE — Telephone Encounter (Signed)
Ok to add the CBC and cmet

## 2018-09-30 NOTE — Telephone Encounter (Signed)
Patient notified  Orders placed Hepatic cancelled and replaced with CMET

## 2018-10-01 ENCOUNTER — Other Ambulatory Visit (INDEPENDENT_AMBULATORY_CARE_PROVIDER_SITE_OTHER): Payer: BLUE CROSS/BLUE SHIELD

## 2018-10-01 DIAGNOSIS — K743 Primary biliary cirrhosis: Secondary | ICD-10-CM | POA: Diagnosis not present

## 2018-10-01 DIAGNOSIS — L905 Scar conditions and fibrosis of skin: Secondary | ICD-10-CM | POA: Diagnosis not present

## 2018-10-01 DIAGNOSIS — M3500 Sicca syndrome, unspecified: Secondary | ICD-10-CM

## 2018-10-01 DIAGNOSIS — L820 Inflamed seborrheic keratosis: Secondary | ICD-10-CM | POA: Diagnosis not present

## 2018-10-01 LAB — CBC WITH DIFFERENTIAL/PLATELET
Basophils Absolute: 0.1 10*3/uL (ref 0.0–0.1)
Basophils Relative: 0.8 % (ref 0.0–3.0)
Eosinophils Absolute: 0.3 10*3/uL (ref 0.0–0.7)
Eosinophils Relative: 4.4 % (ref 0.0–5.0)
HEMATOCRIT: 37.8 % (ref 36.0–46.0)
Hemoglobin: 12.6 g/dL (ref 12.0–15.0)
LYMPHS PCT: 16.3 % (ref 12.0–46.0)
Lymphs Abs: 1 10*3/uL (ref 0.7–4.0)
MCHC: 33.4 g/dL (ref 30.0–36.0)
MCV: 90.9 fl (ref 78.0–100.0)
MONOS PCT: 10.5 % (ref 3.0–12.0)
Monocytes Absolute: 0.7 10*3/uL (ref 0.1–1.0)
Neutro Abs: 4.2 10*3/uL (ref 1.4–7.7)
Neutrophils Relative %: 68 % (ref 43.0–77.0)
Platelets: 208 10*3/uL (ref 150.0–400.0)
RBC: 4.16 Mil/uL (ref 3.87–5.11)
RDW: 13.1 % (ref 11.5–15.5)
WBC: 6.2 10*3/uL (ref 4.0–10.5)

## 2018-10-01 LAB — COMPREHENSIVE METABOLIC PANEL
ALBUMIN: 4.1 g/dL (ref 3.5–5.2)
ALK PHOS: 112 U/L (ref 39–117)
ALT: 17 U/L (ref 0–35)
AST: 20 U/L (ref 0–37)
BILIRUBIN TOTAL: 0.5 mg/dL (ref 0.2–1.2)
BUN: 12 mg/dL (ref 6–23)
CALCIUM: 9.8 mg/dL (ref 8.4–10.5)
CO2: 30 meq/L (ref 19–32)
Chloride: 104 mEq/L (ref 96–112)
Creatinine, Ser: 0.93 mg/dL (ref 0.40–1.20)
GFR: 64.49 mL/min (ref >60.00)
Glucose, Bld: 83 mg/dL (ref 70–99)
Potassium: 4.6 mEq/L (ref 3.5–5.1)
Sodium: 140 mEq/L (ref 135–145)
TOTAL PROTEIN: 7.1 g/dL (ref 6.0–8.3)

## 2018-10-01 LAB — VITAMIN D 25 HYDROXY (VIT D DEFICIENCY, FRACTURES): VITD: 51.36 ng/mL (ref 30.00–100.00)

## 2018-10-06 ENCOUNTER — Encounter: Payer: Self-pay | Admitting: Podiatry

## 2018-10-06 ENCOUNTER — Ambulatory Visit: Payer: Self-pay | Admitting: Podiatry

## 2018-10-06 DIAGNOSIS — B351 Tinea unguium: Secondary | ICD-10-CM

## 2018-10-06 NOTE — Patient Instructions (Signed)

## 2018-10-07 NOTE — Progress Notes (Signed)
Subjective: 62 year old female presents the office today for follow-up evaluation of nail fungus.  She is been using a topical antifungal.  She thinks that the fungus is getting any other toenails and she was discussed laser.  Also she states that the cuticle started to pull away from the nail in the right side but denies any drainage or pus coming from the right third toe.  No pain in the nails. Denies any systemic complaints such as fevers, chills, nausea, vomiting. No acute changes since last appointment, and no other complaints at this time.   Objective: AAO x3, NAD DP/PT pulses palpable bilaterally, CRT less than 3 seconds Nails are mildly dystrophic and discolored with yellow to brown discoloration mildly hypertrophic.  The right third digit toenail the cuticle is pulled away but there is no drainage or pus there is no pain to the nail there is no fluctuation or crepitation or any signs of infection. No open lesions or pre-ulcerative lesions.  No pain with calf compression, swelling, warmth, erythema  Assessment: Onychomycosis  Plan: -All treatment options discussed with the patient including all alternatives, risks, complications.  -She had laser therapy before she was to proceed with this.  She had her first treatment today performed by Shanda BumpsJessica without any complications.  We will hold off on the topical antifungal for now as I think she is getting a reaction but she is to monitor closely for any signs or symptoms of infection that we discussed today.  She understands this.  [facility]  Consider restarting topical next appointment. -Patient encouraged to call the office with any questions, concerns, change in symptoms.   Vivi BarrackMatthew R Ryer Asato DPM

## 2018-10-08 NOTE — Progress Notes (Signed)
Labs great My Chart message She should see me in Late winter (Feb/March)

## 2018-10-12 ENCOUNTER — Encounter: Payer: Self-pay | Admitting: Rheumatology

## 2018-10-12 NOTE — Telephone Encounter (Signed)
Patient last seen in the office on 08/27/18. Patient is treated for Sjogren's syndrome with keratoconjunctivitis sicca  Patient was started on PLQ. Patient reports severe constipation for 3 weeks. Patient does see a GI specialist and has not followed which him regarding the constipation. Patient states she has also had ringing in her hears which happened twice. Patient states she stopped taking the PLQ today. Patient states she would like to continue the pilocarpine.

## 2018-10-12 NOTE — Telephone Encounter (Signed)
Plaquenil usually causes diarrhea and not constipation.  It is okay for her to discontinue pilocarpine for right now.

## 2018-11-02 ENCOUNTER — Other Ambulatory Visit: Payer: Self-pay | Admitting: Rheumatology

## 2018-11-02 ENCOUNTER — Other Ambulatory Visit: Payer: Self-pay | Admitting: Internal Medicine

## 2018-11-02 NOTE — Telephone Encounter (Signed)
Last Visit: 08/27/18 Next visit: 12/01/18 Labs: 08/11/18 cbc wnl 07/06/18 cmp wnl LQ Eye Exam: 09/25/18 WNL  Okay to refill per Dr. Corliss Skainseveshwar

## 2018-11-06 ENCOUNTER — Ambulatory Visit: Payer: Self-pay

## 2018-11-06 DIAGNOSIS — B351 Tinea unguium: Secondary | ICD-10-CM

## 2018-11-09 NOTE — Progress Notes (Signed)
Pt presents with mycotic infection of nails 1-5 bilateral.  All other systems are negative  Laser therapy administered to affected nails and tolerated well. All safety precautions were in place.  3rd treatment.  Follow up in 4 weeks     

## 2018-11-16 ENCOUNTER — Other Ambulatory Visit: Payer: Self-pay | Admitting: Rheumatology

## 2018-11-16 NOTE — Telephone Encounter (Signed)
Last Visit: 08/27/18 Next visit: 12/01/18  Okay to refill per Dr. Corliss Skainseveshwar

## 2018-11-20 NOTE — Progress Notes (Signed)
Office Visit Note  Patient: Dana [name]             Date of Birth: [birth year]           MRN: [id number]             PCP: Burnis Medin, MD Referring: Burnis Medin, MD Visit Date: 12/01/2018 Occupation: @GUAROCC @  Subjective:  Sicca symptoms    History of Present Illness: Dana [name] is a 62 y.o. female with history of Sjogren's syndrome and positive ANA.  After her last visit she was started on PLQ, which she took for a couple weeks but develops ringing in the ears and constipation so she discontinued.  She is taking Pilocarpine 5 mg BID which has been effective.  She continues to have some mouth dryness and using lozenges.  Her eye dryness has improved.  She reports she continues to go to the dentist every 6 months.  She states she has a tooth extraction yesterday due to a crack in the root.  She states she eats a lot of hard candy.   She denies any joint pain or joint swelling at this time.  She denies any recent rashes.  She denies any raynaud's.    Activities of Daily Living:  Patient reports morning stiffness for 0 minutes.   Patient Denies nocturnal pain.  Difficulty dressing/grooming: Denies Difficulty climbing stairs: Denies Difficulty getting out of chair: Denies Difficulty using hands for taps, buttons, cutlery, and/or writing: Denies  Review of Systems  Constitutional: Negative for fatigue.  HENT: Positive for mouth dryness. Negative for mouth sores, trouble swallowing, trouble swallowing and nose dryness.   Eyes: Positive for dryness. Negative for pain, redness, itching and visual disturbance.  Respiratory: Negative for cough, hemoptysis, shortness of breath, wheezing and difficulty breathing.   Cardiovascular: Negative for chest pain, palpitations, hypertension and swelling in legs/feet.  Gastrointestinal: Positive for constipation. Negative for blood in stool and diarrhea.  Endocrine: Negative for increased urination.  Genitourinary: Negative for painful  urination, nocturia and pelvic pain.  Musculoskeletal: Negative for arthralgias, joint pain, joint swelling, myalgias, muscle weakness, morning stiffness, muscle tenderness and myalgias.  Skin: Negative for color change, pallor, rash, hair loss, nodules/bumps, skin tightness, ulcers and sensitivity to sunlight.  Allergic/Immunologic: Negative for susceptible to infections.  Neurological: Negative for dizziness, light-headedness, memory loss and weakness.  Hematological: Negative for swollen glands.  Psychiatric/Behavioral: Negative for depressed mood, confusion and sleep disturbance. The patient is not nervous/anxious.     PMFS History:  Patient Active Problem List   Diagnosis Date Noted  . Onychomycosis 08/05/2018  . Sicca syndrome, unspecified ([place]) 07/13/2018  . Low bone density 07/13/2018  . Vitamin D deficiency 07/06/2018  . Primary biliary cholangitis ([place]) 04/08/2018  . Tetanus, diphtheria, and acellular pertussis (Tdap) vaccination declined 04/08/2018  . Trigger point of right shoulder region 03/24/2018  . Slipped rib syndrome 03/19/2018  . Nonallopathic lesion of rib cage 03/19/2018  . Nonallopathic lesion of thoracic region 03/19/2018  . Nonallopathic lesion of cervical region 03/19/2018  . Low serum vitamin D 04/09/2016  . PFO (patent foramen ovale)   . Abnormal finding on MRI of brain 01/10/2015  . Visit for preventive health examination 11/24/2014  . Bilateral arm weakness  hx of  11/23/2014  . Elevated LFTs 11/23/2014  . Essential hypertension 11/23/2014  . Thyroid activity decreased 11/23/2014  . Hip pain, acute 10/13/2013  . Left paraspinal back pain 10/13/2013  . Chronic constipation 09/16/2013  . Abdominal  bloating 08/23/2013  . ESR raised 08/23/2013  . Right lower quadrant abdominal tenderness 04/05/2013  . Menopausal hot flushes 04/27/2012  . Other and unspecified hyperlipidemia 04/27/2012  . Migraine with aura   . Hyperlipidemia 01/03/2012  . CHEST PAIN,  PRECORDIAL 03/22/2010  . Hypothyroidism 03/21/2010  . HYPERTENSION, UNSPECIFIED 03/21/2010  . CEREBROVASCULAR ACCIDENT, HX OF 03/21/2010    Past Medical History:  Diagnosis Date  . Abnormal finding on MRI of brain 01/10/2015  . CVA (cerebral infarction) 2010   diplopia neg acute MRi whit matter changes neg LP   . High blood pressure    readings  . High cholesterol   . Hx of varicella   . Low bone density 07/13/2018  . Migraine with aura    auras  . Non-cardiac chest pain    hops and evaluation 2011 dr Lia Foyer  . Other and unspecified hyperlipidemia 04/27/2012   Has been on meds for a number of years .  Low dose crestor .  Baseline reported as 280/   . PFO (patent foramen ovale)    Moderate size  . Pneumonia 2007   and flu hospitalized   . Primary biliary cholangitis ([place]) 04/08/2018  . Stroke [facility])    on hrt double vision- possible blood clot on brain; Dr. Floyde Parkins at [facility]   . Thyroid disease     Family History  Adopted: Yes  Problem Relation Age of Onset  . Cancer Mother        pancreatic  . Alcohol abuse Mother   . Breast cancer Maternal Aunt   . Healthy Daughter    Past Surgical History:  Procedure Laterality Date  . BREAST SURGERY     implants  . COLONOSCOPY    . FOOT SURGERY     had bunyon removed  . TONSILLECTOMY    . TOOTH EXTRACTION  11/30/2018  . 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    Neg tad   Some caffiene    Seatbelts no ets fa stored    G1P1   Physically active     64 yo grandaughter    Patient is left handed.      Immunization History  Administered Date(s) Administered  . Influenza Split 09/08/2012  . Influenza,inj,Quad PF,6+ Mos 08/23/2013, 11/16/2014, 09/07/2015, 08/23/2016, 09/02/2017, 09/08/2018  .  Pneumococcal Conjugate-13 04/26/2011   Objective: Vital Signs: BP (!) 146/83 (BP Location: Right Arm, Patient Position: Sitting, Cuff Size: Normal)   Pulse (!) 59   Resp 12   Ht 5' 3"  (1.6 m)   Wt 134 lb (60.8 kg)   LMP 04/25/2009   BMI 23.74 kg/m    Physical Exam Vitals signs and nursing note reviewed.  Constitutional:      Appearance: She is well-developed.  HENT:     Head: Normocephalic and atraumatic.     Comments: No parotid swelling or tenderness. Eyes:     Conjunctiva/sclera: Conjunctivae normal.  Neck:     Musculoskeletal: Normal range of motion.  Cardiovascular:     Rate and Rhythm: Normal rate and regular rhythm.     Heart sounds: Normal heart sounds.  Pulmonary:     Effort:  Pulmonary effort is normal.     Breath sounds: Normal breath sounds.  Abdominal:     General: Bowel sounds are normal.     Palpations: Abdomen is soft.  Lymphadenopathy:     Cervical: No cervical adenopathy.  Skin:    General: Skin is warm and dry.     Capillary Refill: Capillary refill takes less than 2 seconds.  Neurological:     Mental Status: She is alert and oriented to person, place, and time.  Psychiatric:        Behavior: Behavior normal.      Musculoskeletal Exam: C-spine, thoracic spine, and lumbar spine good ROM.  No midline spinal tenderness.  No SI joint tenderness.  Shoulder joints, elbow joints wrist joints, MCPs,PIPs, and DIPs good ROM with no synovitis.  Complete fist formation bilaterally.  Hip joints, knee joints, ankle joints, MTPs, PIPs, and DIPs good ROM with no synovitis.  No warmth or effusion of knee joints.  No tenderness or swelling of ankle joints.   CDAI Exam: CDAI Score: Not documented Patient Global Assessment: Not documented; Provider Global Assessment: Not documented Swollen: Not documented; Tender: Not documented Joint Exam   Not documented   There is currently no information documented on the homunculus. Go to the Rheumatology activity and complete  the homunculus joint exam.  Investigation: No additional findings.  Imaging: No results found.  Recent Labs: Lab Results  Component Value Date   WBC 6.2 10/01/2018   HGB 12.6 10/01/2018   PLT 208.0 10/01/2018   NA 140 10/01/2018   K 4.6 10/01/2018   CL 104 10/01/2018   CO2 30 10/01/2018   GLUCOSE 83 10/01/2018   BUN 12 10/01/2018   CREATININE 0.93 10/01/2018   BILITOT 0.5 10/01/2018   ALKPHOS 112 10/01/2018   AST 20 10/01/2018   ALT 17 10/01/2018   PROT 7.1 10/01/2018   ALBUMIN 4.1 10/01/2018   CALCIUM 9.8 10/01/2018   GFRAA  11/05/2010    >60        The eGFR has been calculated using the MDRD equation. This calculation has not been validated in all clinical situations. eGFR's persistently <60 mL/min signify possible Chronic Kidney Disease.    Speciality Comments: PLQ Eye Exam: 09/25/18 WNL @ [facility] follow up 1 year  Procedures:  No procedures performed Allergies: Estrogens; Prednisone; Codeine; and Ibuprofen    Assessment / Plan:     Visit Diagnoses: Sjogren's syndrome with keratoconjunctivitis sicca ([place]): She continues to have sicca symptoms.  She has noticed improvement since starting on Pilocarpine 5 mg BID.   She has no parotid swelling on exam. She was started on PLQ in October 2019 but she discontinued after 2 weeks due to not being able to tolerate side effects.  She does not want to restart at this time.  A refill of Pilocarpine was sent to the pharmacy.  She was advised to continue to go to the dentist every 6 months.She had a tooth extraction yesterday.   We discussed the association with Sjogren's and poor dental hygiene.  She would like to follow up in a yearly basis.     High risk medication use - She was started on PLQ in October 2019 but discontinued due to not being able to tolerate tinnitus and constipation.  She took it for about 2 weeks.  She continues to take Pilocarpine 5 mg BID for sicca symptoms. PLQ eye was normal on  09/26/18. CBC and CMP WNL on 10/01/18.   Positive ANA (  antinuclear antibody): August 11, 2018 AVISE index 0.2, ANA 1: 5120 homogeneous, SSA positive, dsDNA negative, Smith negative, RNP negative, SSB Negative, SCL 70 is negative, centromere negative, Jo 1-, CB CAP negative, anticardiolipin negative, beta-2 GP 1-, RF positive, anti-CCP negative, antithyroglobulin negative, antithyroid peroxidase negative. She has a features of autoimmune disease besides sicca symptoms at this time.   Primary biliary cholangitis [facility]): She is on Ursodiol by Dr. Carlean Purl.   Vitamin D deficiency: She is taking a calcium and vitamin D supplement daily.   Other medical conditions are listed as follows:   History of stroke  Patent foramen ovale  History of hypothyroidism  History of hyperlipidemia   Orders: No orders of the defined types were placed in this encounter.  Meds ordered this encounter  Medications  . pilocarpine (SALAGEN) 5 MG tablet    Sig: TAKE  (1)  TABLET  THREE TIMES DAILY AS NEEDED.    Dispense:  90 tablet    Refill:  1     Follow-Up Instructions: Return in about 6 months (around 06/01/2019) for Sjogren's syndrome.   Ofilia Neas, PA-C  Note - This record has been created using Dragon software.  Chart creation errors have been sought, but may not always  have been located. Such creation errors do not reflect on  the standard of medical care.

## 2018-11-30 HISTORY — PX: TOOTH EXTRACTION: SUR596

## 2018-12-01 ENCOUNTER — Encounter: Payer: Self-pay | Admitting: Physician Assistant

## 2018-12-01 ENCOUNTER — Ambulatory Visit: Payer: BLUE CROSS/BLUE SHIELD | Admitting: Physician Assistant

## 2018-12-01 VITALS — BP 146/83 | HR 59 | Resp 12 | Ht 63.0 in | Wt 134.0 lb

## 2018-12-01 DIAGNOSIS — Z79899 Other long term (current) drug therapy: Secondary | ICD-10-CM

## 2018-12-01 DIAGNOSIS — R768 Other specified abnormal immunological findings in serum: Secondary | ICD-10-CM | POA: Diagnosis not present

## 2018-12-01 DIAGNOSIS — Z8673 Personal history of transient ischemic attack (TIA), and cerebral infarction without residual deficits: Secondary | ICD-10-CM

## 2018-12-01 DIAGNOSIS — E559 Vitamin D deficiency, unspecified: Secondary | ICD-10-CM

## 2018-12-01 DIAGNOSIS — M3501 Sicca syndrome with keratoconjunctivitis: Secondary | ICD-10-CM | POA: Diagnosis not present

## 2018-12-01 DIAGNOSIS — K743 Primary biliary cirrhosis: Secondary | ICD-10-CM

## 2018-12-01 DIAGNOSIS — Z8639 Personal history of other endocrine, nutritional and metabolic disease: Secondary | ICD-10-CM

## 2018-12-01 DIAGNOSIS — Q211 Atrial septal defect: Secondary | ICD-10-CM

## 2018-12-01 DIAGNOSIS — Q2112 Patent foramen ovale: Secondary | ICD-10-CM

## 2018-12-01 MED ORDER — PILOCARPINE HCL 5 MG PO TABS: ORAL_TABLET | ORAL | 1 refills | Status: DC

## 2018-12-04 ENCOUNTER — Ambulatory Visit: Payer: Self-pay

## 2018-12-04 DIAGNOSIS — B351 Tinea unguium: Secondary | ICD-10-CM

## 2018-12-04 NOTE — Patient Instructions (Signed)
3 tablespoon of: coconut oil, olive oil, or almond oil ( any carrier oil) 10-15 drops of tea tree oil  Use mixture, rub into feet and toenails 2-3 times a week3 tablespoon of: coconut oil, olive oil, or almond oil ( any carrier oil) 10-15 drops of tea tree oil  Use mixture, rub into feet and toenails 2-3 times a week

## 2018-12-14 NOTE — Progress Notes (Signed)
Pt presents with mycotic infection of nails 1-5 bilateral  All other systems are negative  Laser therapy administered to affected nails and tolerated well. All safety precautions were in place. 4th treatment.  Follow up in 4 weeks     

## 2019-01-07 ENCOUNTER — Other Ambulatory Visit: Payer: Self-pay | Admitting: Internal Medicine

## 2019-02-01 ENCOUNTER — Encounter: Payer: Self-pay | Admitting: Internal Medicine

## 2019-02-01 ENCOUNTER — Other Ambulatory Visit (INDEPENDENT_AMBULATORY_CARE_PROVIDER_SITE_OTHER): Payer: BLUE CROSS/BLUE SHIELD

## 2019-02-01 ENCOUNTER — Ambulatory Visit (INDEPENDENT_AMBULATORY_CARE_PROVIDER_SITE_OTHER): Payer: BLUE CROSS/BLUE SHIELD | Admitting: Internal Medicine

## 2019-02-01 VITALS — BP 122/74 | HR 84 | Ht 64.0 in | Wt 135.2 lb

## 2019-02-01 DIAGNOSIS — K743 Primary biliary cirrhosis: Secondary | ICD-10-CM | POA: Diagnosis not present

## 2019-02-01 DIAGNOSIS — K5909 Other constipation: Secondary | ICD-10-CM | POA: Diagnosis not present

## 2019-02-01 LAB — HEPATIC FUNCTION PANEL
ALT: 19 U/L (ref 0–35)
AST: 19 U/L (ref 0–37)
Albumin: 4.3 g/dL (ref 3.5–5.2)
Alkaline Phosphatase: 114 U/L (ref 39–117)
Bilirubin, Direct: 0 mg/dL (ref 0.0–0.3)
Total Bilirubin: 0.4 mg/dL (ref 0.2–1.2)
Total Protein: 7.5 g/dL (ref 6.0–8.3)

## 2019-02-01 NOTE — Progress Notes (Signed)
Normal LFT's Let's recheck in 3 months please  Dx primary biliary cholangitis

## 2019-02-01 NOTE — Progress Notes (Addendum)
Dana YRIGOYEN 62 y.o. [birth year] [id number]  Assessment & Plan:   Primary biliary cholangitis (HCC) Has had great response to ursodiol Recheck LFT's today   Chronic constipation We discussed options of Rx vs OTC Tx  Try MiraLax purge Then 2 doses Miralax qd and 1 tbsp Benefiber daily High-fiber diet and increased fluids If that fails to work consider Linzess, other phramacologic Tx Bisacodyl prn also an option Does not seem to have dyssynergic defecation or pelvic floor dysfx based upon rectal exam  CC: Panosh, Neta Mends, MD   Subjective:   Chief Complaint: constipation  HPI The patient is here because of chronic constipation issues that remain bothersome.  In general she does not bring this up but she has suffered with infrequent defecation for many years but now the stools seem harder and perhaps more ball like, little balls of stool coming out.  It feels like there is a part of the bowel movement that just does not come out.  She is not having any bleeding.  She had a colonoscopy in 2014 with sigmoid diverticulosis but no other abnormalities.  In the past she has tried increasing water she is tried daily MiraLAX Metamucil supplementation for weeks at a time but did not feel like it provided much benefit.  She does not tend to get bloated.  She can eat vegetables but does not like to eat fruit because she is concerned it would put weight on her.  She does not have any urinary difficulty or any history of significant birth trauma.  Primary biliary cholangitis is responded well to ursodiol.  It has not affected her bowel movements at all.  Dry mouth perhaps slightly better on pilocarpine, she did end up with a diagnosis of Sjogren's disease, through Dr. Corliss Skains  of rheumatology.  She does have persistent fatigue  Allergies  Allergen Reactions  . Estrogens Other (See Comments)    Had stroke on HRT/OCP  . Prednisone Other (See Comments)    Jittery and irritable hard to sleep  with oral pred  . Codeine     REACTION: nausea  . Ibuprofen     REACTION: nause   Current Meds  Medication Sig  . aspirin 81 MG tablet Take 81 mg by mouth daily.  Marland Kitchen atorvastatin (LIPITOR) 20 MG tablet Take 1 tablet (20 mg total) by mouth daily.  . Calcium Carbonate-Vit D-Min (CALCIUM 1200 PO) Take by mouth daily.  . cholecalciferol (VITAMIN D) 1000 units tablet Take 1,000 Units by mouth daily.  Marland Kitchen gabapentin (NEURONTIN) 100 MG capsule TAKE 1 CAPSULE TWICE A DAY AND INCREASE TO 3 CAPSULES TWICE A DAY OVER 3 DAYS  . levothyroxine (SYNTHROID, LEVOTHROID) 75 MCG tablet Take 1 tablet (75 mcg total) by mouth daily.  . pilocarpine (SALAGEN) 5 MG tablet TAKE  (1)  TABLET  THREE TIMES DAILY AS NEEDED.  Marland Kitchen ursodiol (ACTIGALL) 250 MG tablet Take 1 tablet (250 mg total) by mouth 2 (two) times daily.   Past Medical History:  Diagnosis Date  . Abnormal finding on MRI of brain 01/10/2015  . CVA (cerebral infarction) 2010   diplopia neg acute MRi whit matter changes neg LP   . High blood pressure    readings  . High cholesterol   . Hx of varicella   . Low bone density 07/13/2018  . Migraine with aura    auras  . Non-cardiac chest pain    hops and evaluation 2011 dr Riley Kill  . Other and unspecified hyperlipidemia 04/27/2012  Has been on meds for a number of years .  Low dose crestor .  Baseline reported as 280/   . PFO (patent foramen ovale)    Moderate size  . Pneumonia 2007   and flu hospitalized   . Primary biliary cholangitis (HCC) 04/08/2018  . Sjogren's disease (HCC)   . Stroke [facility])    on hrt double vision- possible blood clot on brain; Dr. Lesia Sago at [facility]   . Thyroid disease    Past Surgical History:  Procedure Laterality Date  . BREAST SURGERY     implants  . COLONOSCOPY    . FOOT SURGERY     had bunyon removed  . TONSILLECTOMY    . TOOTH EXTRACTION  11/30/2018  . 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    Neg tad   Some caffiene    Seatbelts no ets fa stored    G1P1   Physically active     5 yo grandaughter    Patient is left handed.      family history includes Alcohol abuse in her mother; Breast cancer in her maternal aunt; Cancer in her mother; Healthy in her daughter. She was adopted.   Review of Systems See HPI  Objective:   Physical Exam @BP  122/74 (BP Location: Left Arm, Patient Position: Sitting, Cuff Size: Normal)   Pulse 84   Ht 5\' 4"  (1.626 m) Comment: height measured without shoes  Wt 135 lb 4 oz (61.3 kg)   LMP 04/25/2009   BMI 23.22 kg/m @ Patti [place], CMA present.  General:  NAD Eyes:   anicteric Lungs:  clear Heart::  S1S2 no rubs, murmurs or gallops Abdomen:  soft and nontender, BS+ Ext:   no edema, cyanosis or clubbing  Patti [place], CMA present.  Rectal exam reveals a normal anoderm, anal wink absent, normal resting tone nontender no rectocele or mass.  There are firm ball like pieces of brown stool present.  Voluntary anal squeeze is intact and appropriate, simulated defecation reveals appropriate relaxation and descent and abdominal contraction.

## 2019-02-01 NOTE — Patient Instructions (Signed)
If you are age 65 or older, your body mass index should be between 23-30. Your Body mass index is 23.22 kg/m. If this is out of the aforementioned range listed, please consider follow up with your Primary Care Provider.  If you are age 14 or younger, your body mass index should be between 19-25. Your Body mass index is 23.22 kg/m. If this is out of the aformentioned range listed, please consider follow up with your Primary Care Provider.   Your provider has requested that you go to the basement level for lab work before leaving today. Press "B" on the elevator. The lab is located at the first door on the left as you exit the elevator.  Dr Leone Payor recommends that you complete a bowel purge (to clean out your bowels). Please do the following: Purchase a bottle of Miralax over the counter as well as a box of 5 mg dulcolax tablets. Take 4 dulcolax tablets. Wait 1 hour. You will then drink 6-8 capfuls of Miralax mixed in an adequate amount of water/juice/gatorade (you may choose which of these liquids to drink) over the next 2-3 hours. You should expect results within 1 to 6 hours after completing the bowel purge.  After the purge take 2 doses of Miralax every day.  Take one tablespoon of Benefiber daily.  We are giving you a handout on High Fiber Diet to read and follow.  I appreciate the opportunity to care for you. Stan Head, MD, [facility]

## 2019-02-01 NOTE — Assessment & Plan Note (Signed)
Has had great response to ursodiol Recheck LFT's today

## 2019-02-01 NOTE — Assessment & Plan Note (Addendum)
We discussed options of Rx vs OTC Tx  Try MiraLax purge Then 2 doses Miralax qd and 1 tbsp Benefiber daily High-fiber diet and increased fluids If that fails to work consider Linzess, other phramacologic Tx Bisacodyl prn also an option Does not seem to have dyssynergic defecation or pelvic floor dysfx based upon rectal exam

## 2019-02-02 ENCOUNTER — Other Ambulatory Visit: Payer: Self-pay

## 2019-02-02 DIAGNOSIS — K743 Primary biliary cirrhosis: Secondary | ICD-10-CM

## 2019-02-02 NOTE — Progress Notes (Signed)
Hepatic  

## 2019-02-05 ENCOUNTER — Other Ambulatory Visit: Payer: Self-pay | Admitting: Internal Medicine

## 2019-02-05 ENCOUNTER — Other Ambulatory Visit: Payer: Self-pay | Admitting: Physician Assistant

## 2019-02-05 NOTE — Telephone Encounter (Signed)
Last Visit: 12/01/18 Next Visit due in July 2020.   Okay to refill per Dr. Corliss Skains

## 2019-02-16 ENCOUNTER — Telehealth: Payer: Self-pay

## 2019-02-16 NOTE — Telephone Encounter (Signed)
Please place order for tsh and free t4  And  Lipid panel  Dx  hypothryoid and hld    And then OV or web televisit  to evaluate if not feeling well .

## 2019-02-16 NOTE — Telephone Encounter (Signed)
Copied from CRM [id number]. Topic: Appointment Scheduling - Scheduling Inquiry for Clinic >> Feb 16, 2019 10:00 AM Arlyss Gandy, NT wrote: Reason for CRM: Pt states she believes she needs her thyroid levels checked for the headaches and not feeling well she has been experiencing. She states normally she has a full panel. Please advise.

## 2019-02-18 ENCOUNTER — Other Ambulatory Visit: Payer: Self-pay

## 2019-02-18 DIAGNOSIS — E785 Hyperlipidemia, unspecified: Secondary | ICD-10-CM

## 2019-02-18 DIAGNOSIS — E039 Hypothyroidism, unspecified: Secondary | ICD-10-CM

## 2019-02-18 NOTE — Telephone Encounter (Signed)
Pt states she is feeling better today. And does not need to come do labs or ov

## 2019-02-18 NOTE — Telephone Encounter (Signed)
Pt states she will wait till may the 15th around when she needs her cpe

## 2019-03-08 ENCOUNTER — Other Ambulatory Visit: Payer: Self-pay | Admitting: Rheumatology

## 2019-03-08 ENCOUNTER — Telehealth: Payer: Self-pay | Admitting: Rheumatology

## 2019-03-08 NOTE — Telephone Encounter (Signed)
-----   Message from Ellen Henri, CMA sent at 03/08/2019  8:42 AM EDT ----- Please call to schedule follow up. Patient is due 05/2019. Thanks!

## 2019-03-08 NOTE — Telephone Encounter (Signed)
Tried to contact patient to schedule a follow-up appointment / voicemail is full and cannot accept messages at this time.

## 2019-03-08 NOTE — Telephone Encounter (Signed)
Last Visit: 12/01/2018 Next Visit: message sent to the front desk to schedule.   Okay to refill per Dr. Corliss Skains.

## 2019-03-27 ENCOUNTER — Other Ambulatory Visit: Payer: Self-pay | Admitting: Internal Medicine

## 2019-04-24 ENCOUNTER — Other Ambulatory Visit: Payer: Self-pay | Admitting: Rheumatology

## 2019-04-24 ENCOUNTER — Other Ambulatory Visit: Payer: Self-pay | Admitting: Internal Medicine

## 2019-04-26 NOTE — Telephone Encounter (Signed)
Please schedule patient for a follow up visit. Patient due July 2020. Thanks! 

## 2019-04-26 NOTE — Telephone Encounter (Signed)
Last Visit: 12/01/2018 Next Visit: follow up appointment due  July 2020. Message sent to the front to schedule.   Okay to refill per Dr. Corliss Skains

## 2019-04-29 ENCOUNTER — Other Ambulatory Visit (INDEPENDENT_AMBULATORY_CARE_PROVIDER_SITE_OTHER): Payer: BC Managed Care – PPO

## 2019-04-29 DIAGNOSIS — E039 Hypothyroidism, unspecified: Secondary | ICD-10-CM

## 2019-04-29 DIAGNOSIS — E785 Hyperlipidemia, unspecified: Secondary | ICD-10-CM | POA: Diagnosis not present

## 2019-04-29 DIAGNOSIS — K743 Primary biliary cirrhosis: Secondary | ICD-10-CM

## 2019-04-30 LAB — TSH: TSH: 1.46 u[IU]/mL (ref 0.35–4.50)

## 2019-04-30 LAB — LIPID PANEL
Cholesterol: 159 mg/dL (ref 0–200)
HDL: 77.1 mg/dL (ref >39.00)
LDL Cholesterol: 62 mg/dL (ref 0–99)
NonHDL: 81.58
Total CHOL/HDL Ratio: 2
Triglycerides: 100 mg/dL (ref 0.0–149.0)
VLDL: 20 mg/dL (ref 0.0–40.0)

## 2019-04-30 LAB — HEPATIC FUNCTION PANEL
ALT: 15 U/L (ref 0–35)
AST: 17 U/L (ref 0–37)
Albumin: 4.1 g/dL (ref 3.5–5.2)
Alkaline Phosphatase: 106 U/L (ref 39–117)
Bilirubin, Direct: 0 mg/dL (ref 0.0–0.3)
Total Bilirubin: 0.4 mg/dL (ref 0.2–1.2)
Total Protein: 7.1 g/dL (ref 6.0–8.3)

## 2019-04-30 LAB — T4, FREE: Free T4: 1.07 ng/dL (ref 0.60–1.60)

## 2019-05-03 NOTE — Telephone Encounter (Signed)
Pharmacy calling to follow up on this med request from 04/24/19.  Please advise, thanks

## 2019-05-04 ENCOUNTER — Other Ambulatory Visit: Payer: Self-pay | Admitting: Internal Medicine

## 2019-05-04 DIAGNOSIS — H2513 Age-related nuclear cataract, bilateral: Secondary | ICD-10-CM | POA: Diagnosis not present

## 2019-05-04 DIAGNOSIS — Z79899 Other long term (current) drug therapy: Secondary | ICD-10-CM | POA: Diagnosis not present

## 2019-05-04 DIAGNOSIS — H43812 Vitreous degeneration, left eye: Secondary | ICD-10-CM | POA: Diagnosis not present

## 2019-05-04 DIAGNOSIS — M3500 Sicca syndrome, unspecified: Secondary | ICD-10-CM | POA: Diagnosis not present

## 2019-05-04 DIAGNOSIS — K743 Primary biliary cirrhosis: Secondary | ICD-10-CM

## 2019-05-05 NOTE — Telephone Encounter (Signed)
Pt has been scheduled for virtual visit on Friday

## 2019-05-06 NOTE — Progress Notes (Signed)
Virtual Visit via Video Note  I connected with@ on 05/07/19 at  9:45 AM EDT by a video enabled telemedicine application and verified that I am speaking with the correct person using two identifiers. Location patient: home Location provider:work  office Persons participating in the virtual visit: patient, provider  WIth national recommendations  regarding COVID 19 pandemic   video visit is advised over in office visit for this patient.  Patient aware  of the limitations of evaluation and management by telemedicine and  availability of in person appointments. and agreed to proceed.   HPI: Dana [name] presents for video visit  Due for yearly visit but in covid restrictions  Needs med refilled    Thyroid taking daily  No problem  Lipids taking atorva no sx  Or se noted   PBC stable   Sees rhyum ofr sicca  ?yearsy on pilocarpine  Asks about us rxing this med    No new sx   Working at work but only 5 peropl isolated and feels safety  precautions are good.    ROS: See pertinent positives and negatives per HPI. No systemic sx of concern today  bp ok   Past Medical History:  Diagnosis Date  . Abnormal finding on MRI of brain 01/10/2015  . CVA (cerebral infarction) 2010   diplopia neg acute MRi whit matter changes neg LP   . Elevated LFTs 11/23/2014  . High blood pressure    readings  . High cholesterol   . Hip pain, acute 10/13/2013  . Hx of varicella   . Low bone density 07/13/2018  . Migraine with aura    auras  . Non-cardiac chest pain    hops and evaluation 2011 dr Riley KillStuckey  . Other and unspecified hyperlipidemia 04/27/2012   Has been on meds for a number of years .  Low dose crestor .  Baseline reported as 280/   . PFO (patent foramen ovale)    Moderate size  . Pneumonia 2007   and flu hospitalized   . Primary biliary cholangitis (HCC) 04/08/2018  . Sjogren's disease (HCC)   . Stroke [facility](HCC)    on hrt double vision- possible blood clot on brain; [facility] Les[facility]Keith Willis [facility]   . Thyroid disease     Past Surgical History:  Procedure Laterality Date  . BREAST SURGERY     implants  . COLONOSCOPY    . FOOT SURGERY     had bunyon removed  . TONSILLECTOMY    . TOOTH EXTRACTION  11/30/2018  . TUBAL LIGATION      Family History  Adopted: Yes  Problem Relation Age of Onset  . Cancer Mother        pancreatic  . Alcohol abuse Mother   . Breast cancer Maternal Aunt   . Healthy Daughter     Social History   Tobacco Use  . Smoking status: Never Smoker  . Smokeless tobacco: Never Used  Substance Use Topics  . Alcohol use: No  . Drug use: No      Current Outpatient Medications:  .  aspirin 81 MG tablet, Take 81 mg by mouth daily., Disp: , Rfl:  .  atorvastatin (LIPITOR) 20 MG tablet, Take 1 tablet (20 mg total) by mouth daily., Disp: 90 tablet, Rfl: 3 .  Calcium Carbonate-Vit D-Min (CALCIUM 1200 PO), Take by mouth daily., Disp: , Rfl:  .  cholecalciferol (VITAMIN D) 1000 units tablet, Take 1,000 Units by mouth daily., Disp: , Rfl:  .  levothyroxine (SYNTHROID) 75 MCG tablet, Take 1 tablet (75 mcg total) by mouth daily., Disp: 90 tablet, Rfl: 3 .  pilocarpine (SALAGEN) 5 MG tablet, TAKE (1) TABLET THREE TIMES DAILY AS NEEDED., Disp: 90 tablet, Rfl: 0 .  ursodiol (ACTIGALL) 250 MG tablet, Take 1 tablet (250 mg total) by mouth 2 (two) times daily., Disp: 60 tablet, Rfl: 11 .  gabapentin (NEURONTIN) 100 MG capsule, TAKE 1 CAPSULE TWICE A DAY AND INCREASE TO 3 CAPSULES TWICE A DAY OVER 3 DAYS (Patient not taking: Reported on 05/07/2019), Disp: 60 capsule, Rfl: 0  EXAM: BP Readings from Last 3 Encounters:  02/01/19 122/74  12/01/18 (!) 146/83  08/27/18 114/76   Wt Readings from Last 3 Encounters:  02/01/19 135 lb 4 oz (61.3 kg)  12/01/18 134 lb (60.8 kg)  08/27/18 135 lb (61.2 kg)    VITALS per patient if applicable: nl   GENERAL: alert, oriented, appears well and in no acute distress  HEENT: atraumatic, conjunttiva clear,  no obvious abnormalities on inspection of external nose and ears  NECK: normal movements of the head and neck  LUNGS: on inspection no signs of respiratory distress, breathing rate appears normal, no obvious gross SOB, gasping or wheezing  CV: no obvious cyanosis  PSYCH/NEURO: pleasant and cooperative, no obvious depression or anxiety, speech and thought processing grossly intact Lab Results  Component Value Date   WBC 6.2 10/01/2018   HGB 12.6 10/01/2018   HCT 37.8 10/01/2018   PLT 208.0 10/01/2018   GLUCOSE 83 10/01/2018   CHOL 159 04/29/2019   TRIG 100.0 04/29/2019   HDL 77.10 04/29/2019   LDLCALC 62 04/29/2019   ALT 15 04/29/2019   AST 17 04/29/2019   NA 140 10/01/2018   K 4.6 10/01/2018   CL 104 10/01/2018   CREATININE 0.93 10/01/2018   BUN 12 10/01/2018   CO2 30 10/01/2018   TSH 1.46 04/29/2019   INR 1.1 (H) 06/03/2018    ASSESSMENT AND PLAN:  Discussed the following assessment and plan: 1. Hypothyroidism, unspecified type   2. Hyperlipidemia, unspecified hyperlipidemia type   3. Medication management   4. Sjogren's syndrome, with unspecified organ involvement ([place])   5. Primary biliary cholangitis (HCC) ttsh and lipids are in range   Refill med  advise we can  Order meds for sicca but  Since she has risk of systemic autoimmune difficulties advise appropriate fu with rheum to be able to pick up on important signs   Early if rx needed  At this time she seems to be doing well. Will keep appt with dr D in July at this time  Counseled.   Expectant management and discussion of plan and treatment with opportunity to ask questions and all were answered. The patient agreed with the plan and demonstrated an understanding of the instructions.   Advised to call back or seek an in-person evaluation if worsening  or having  further concerns . In interim   Plan cpx exam in fall oct  Or thereabouts.  Shanon Ace, MD

## 2019-05-07 ENCOUNTER — Other Ambulatory Visit: Payer: Self-pay

## 2019-05-07 ENCOUNTER — Encounter: Payer: Self-pay | Admitting: Internal Medicine

## 2019-05-07 ENCOUNTER — Ambulatory Visit (INDEPENDENT_AMBULATORY_CARE_PROVIDER_SITE_OTHER): Payer: BC Managed Care – PPO | Admitting: Internal Medicine

## 2019-05-07 DIAGNOSIS — M3500 Sicca syndrome, unspecified: Secondary | ICD-10-CM | POA: Diagnosis not present

## 2019-05-07 DIAGNOSIS — E785 Hyperlipidemia, unspecified: Secondary | ICD-10-CM

## 2019-05-07 DIAGNOSIS — Z79899 Other long term (current) drug therapy: Secondary | ICD-10-CM | POA: Diagnosis not present

## 2019-05-07 DIAGNOSIS — K743 Primary biliary cirrhosis: Secondary | ICD-10-CM

## 2019-05-07 DIAGNOSIS — E039 Hypothyroidism, unspecified: Secondary | ICD-10-CM | POA: Diagnosis not present

## 2019-05-07 MED ORDER — LEVOTHYROXINE SODIUM 75 MCG PO TABS: 75.0000 ug | ORAL_TABLET | Freq: Every day | ORAL | 3 refills | Status: DC

## 2019-05-07 MED ORDER — ATORVASTATIN CALCIUM 20 MG PO TABS: 20.0000 mg | ORAL_TABLET | Freq: Every day | ORAL | 3 refills | Status: DC

## 2019-05-11 DIAGNOSIS — H43812 Vitreous degeneration, left eye: Secondary | ICD-10-CM | POA: Diagnosis not present

## 2019-05-11 DIAGNOSIS — Z79899 Other long term (current) drug therapy: Secondary | ICD-10-CM | POA: Diagnosis not present

## 2019-05-11 DIAGNOSIS — H2513 Age-related nuclear cataract, bilateral: Secondary | ICD-10-CM | POA: Diagnosis not present

## 2019-05-11 DIAGNOSIS — M3500 Sicca syndrome, unspecified: Secondary | ICD-10-CM | POA: Diagnosis not present

## 2019-05-25 ENCOUNTER — Encounter: Payer: Self-pay | Admitting: Rheumatology

## 2019-06-07 NOTE — Progress Notes (Signed)
Office Visit Note  Patient: Dana [name]             Date of Birth: 15-Aug-1956           MRN: [id number]             PCP: Burnis Medin, MD Referring: Burnis Medin, MD Visit Date: 06/21/2019 Occupation: @GUAROCC @  Subjective:  Sicca symptoms   History of Present Illness: Dana [name] is a 63 y.o. female with history of Sjogren's and positive ANA. She takes pilocarpine 5 mg BID prn for sicca symptoms.  She continues to have chronic sicca symptoms.  She reports that she often wakes up in the night with eye dryness.  She states that her husband sleeps with a fan which worsens for sicca symptoms.  She denies any sores in her mouth or nose.  She has not had any recent rashes or photosensitivity.  She denies any symptoms of Raynaud's.  She denies any palpitations or shortness of breath.  She denies any joint pain or joint swelling at this time.  She denies any morning stiffness.  She has not had any fevers or swollen joints but she continues to have chronic fatigue   Activities of Daily Living:  Patient reports morning stiffness for 0 minutes.   Patient Denies nocturnal pain.  Difficulty dressing/grooming: Denies Difficulty climbing stairs: Denies Difficulty getting out of chair: Denies Difficulty using hands for taps, buttons, cutlery, and/or writing: Denies  Review of Systems  Constitutional: Positive for fatigue.  HENT: Positive for mouth dryness. Negative for mouth sores and nose dryness.   Eyes: Positive for dryness. Negative for pain and visual disturbance.  Respiratory: Negative for cough, hemoptysis, shortness of breath and difficulty breathing.   Cardiovascular: Negative for chest pain, palpitations, hypertension and swelling in legs/feet.  Gastrointestinal: Positive for constipation (Following up with Dr. Carlean Purl). Negative for blood in stool and diarrhea.  Endocrine: Negative for increased urination.  Genitourinary: Negative for painful urination.  Musculoskeletal:  Negative for arthralgias, joint pain, joint swelling, myalgias, muscle weakness, morning stiffness, muscle tenderness and myalgias.  Skin: Negative for color change, pallor, rash, hair loss, nodules/bumps, skin tightness, ulcers and sensitivity to sunlight.  Allergic/Immunologic: Negative for susceptible to infections.  Neurological: Negative for dizziness, numbness, headaches and weakness.  Hematological: Negative for swollen glands.  Psychiatric/Behavioral: Positive for sleep disturbance. Negative for depressed mood. The patient is not nervous/anxious.     PMFS History:  Patient Active Problem List   Diagnosis Date Noted  . Onychomycosis 08/05/2018  . Sjogren's disease ([place]) 07/13/2018  . Low bone density 07/13/2018  . Vitamin D deficiency 07/06/2018  . Primary biliary cholangitis ([place]) 04/08/2018  . Tetanus, diphtheria, and acellular pertussis (Tdap) vaccination declined 04/08/2018  . Trigger point of right shoulder region 03/24/2018  . Slipped rib syndrome 03/19/2018  . Nonallopathic lesion of rib cage 03/19/2018  . Nonallopathic lesion of thoracic region 03/19/2018  . Nonallopathic lesion of cervical region 03/19/2018  . Low serum vitamin D 04/09/2016  . PFO (patent foramen ovale)   . Abnormal finding on MRI of brain 01/10/2015  . Visit for preventive health examination 11/24/2014  . Bilateral arm weakness  hx of  11/23/2014  . Essential hypertension 11/23/2014  . Left paraspinal back pain 10/13/2013  . Chronic constipation 09/16/2013  . ESR raised 08/23/2013  . Right lower quadrant abdominal tenderness 04/05/2013  . Menopausal hot flushes 04/27/2012  . Other and unspecified hyperlipidemia 04/27/2012  . Migraine with aura   .  Hyperlipidemia 01/03/2012  . Hypothyroidism 03/21/2010  . HYPERTENSION, UNSPECIFIED 03/21/2010  . CEREBROVASCULAR ACCIDENT, HX OF 03/21/2010    Past Medical History:  Diagnosis Date  . Abnormal finding on MRI of brain 01/10/2015  . CVA (cerebral  infarction) 2010   diplopia neg acute MRi whit matter changes neg LP   . Elevated LFTs 11/23/2014  . High blood pressure    readings  . High cholesterol   . Hip pain, acute 10/13/2013  . Hx of varicella   . Low bone density 07/13/2018  . Migraine with aura    auras  . Non-cardiac chest pain    hops and evaluation 2011 dr Lia Foyer  . Other and unspecified hyperlipidemia 04/27/2012   Has been on meds for a number of years .  Low dose crestor .  Baseline reported as 280/   . PFO (patent foramen ovale)    Moderate size  . Pneumonia 2007   and flu hospitalized   . Primary biliary cholangitis ([place]) 04/08/2018  . Sjogren's disease ([place])   . Stroke [facility])    on hrt double vision- possible blood clot on brain; Dr. Floyde Parkins at [facility]   . Thyroid disease     Family History  Adopted: Yes  Problem Relation Age of Onset  . Cancer Mother        pancreatic  . Alcohol abuse Mother   . Breast cancer Maternal Aunt   . Healthy Daughter    Past Surgical History:  Procedure Laterality Date  . BREAST SURGERY     implants  . COLONOSCOPY    . FOOT SURGERY     had bunyon removed  . TONSILLECTOMY    . TOOTH EXTRACTION  11/30/2018  . 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    Neg tad   Some caffiene    Seatbelts no ets fa stored    G1P1   Physically active     67 yo grandaughter    Patient is left handed.      Immunization History  Administered Date(s) Administered  . Influenza Split 09/08/2012  . Influenza,inj,Quad PF,6+ Mos 08/23/2013, 11/16/2014, 09/07/2015, 08/23/2016, 09/02/2017, 09/08/2018  . Pneumococcal Conjugate-13 04/26/2011     Objective: Vital Signs: BP 138/85 (BP Location: Left Arm, Patient Position: Sitting, Cuff Size: Normal)   Pulse 71    Resp 12   Ht 5' 3"  (1.6 m)   Wt 132 lb (59.9 kg)   LMP 04/25/2009   BMI 23.38 kg/m    Physical Exam Vitals signs and nursing note reviewed.  Constitutional:      Appearance: She is well-developed.  HENT:     Head: Normocephalic and atraumatic.  Eyes:     Conjunctiva/sclera: Conjunctivae normal.  Neck:     Musculoskeletal: Normal range of motion.  Cardiovascular:     Rate and Rhythm: Normal rate and regular rhythm.     Heart sounds: Normal heart sounds.  Pulmonary:     Effort: Pulmonary effort is normal.     Breath sounds: Normal breath sounds.  Abdominal:     General: Bowel sounds are normal.     Palpations: Abdomen is soft.  Lymphadenopathy:  Cervical: No cervical adenopathy.  Skin:    General: Skin is warm and dry.     Capillary Refill: Capillary refill takes less than 2 seconds.  Neurological:     Mental Status: She is alert and oriented to person, place, and time.  Psychiatric:        Behavior: Behavior normal.      Musculoskeletal Exam: C-spine, thoracic spine, lumbar spine good range of motion.  No midline spinal tenderness.  No SI joint tenderness.  Shoulders, elbows, wrist joints, MCPs, PIPs and DIPs good range of motion no synovitis.  Hip joints, knee joints, ankle joints, MTPs, PIPs and DIPs good range of motion no synovitis.  No warmth or effusion of bilateral knee joints.  No tenderness or swelling of ankle joints.  No Achilles tendinitis or plantar fasciitis.  No tenderness over trochanteric bursa bilaterally  CDAI Exam: CDAI Score: - Patient Global: -; Provider Global: - Swollen: -; Tender: - Joint Exam   No joint exam has been documented for this visit   There is currently no information documented on the homunculus. Go to the Rheumatology activity and complete the homunculus joint exam.  Investigation: No additional findings.  Imaging: No results found.  Recent Labs: Lab Results  Component Value Date   WBC 6.2 10/01/2018   HGB 12.6  10/01/2018   PLT 208.0 10/01/2018   NA 140 10/01/2018   K 4.6 10/01/2018   CL 104 10/01/2018   CO2 30 10/01/2018   GLUCOSE 83 10/01/2018   BUN 12 10/01/2018   CREATININE 0.93 10/01/2018   BILITOT 0.4 04/29/2019   ALKPHOS 106 04/29/2019   AST 17 04/29/2019   ALT 15 04/29/2019   PROT 7.1 04/29/2019   ALBUMIN 4.1 04/29/2019   CALCIUM 9.8 10/01/2018   GFRAA  11/05/2010    >60        The eGFR has been calculated using the MDRD equation. This calculation has not been validated in all clinical situations. eGFR's persistently <60 mL/min signify possible Chronic Kidney Disease.    Speciality Comments: PLQ Eye Exam: 09/25/18 WNL @ [facility] follow up 1 year  Procedures:  No procedures performed Allergies: Estrogens, Prednisone, Codeine, and Ibuprofen   August 11, 2018 AVISE index 0.2, ANA 1: 5120 homogeneous, SSA positive, dsDNA negative, Smith negative, RNP negative, SSB Negative, SCL 70 is negative, centromere negative, Jo 1-, CB CAP negative, anticardiolipin negative, beta-2 GP 1-, RF positive, anti-CCP negative, antithyroglobulin negative, antithyroid peroxidase negative  Assessment / Plan:     Visit Diagnoses: Sjogren's syndrome with keratoconjunctivitis sicca ([place]) -She has chronic sicca symptoms.  She takes pilocarpine 5 mg twice daily.  She has been experiencing worsening sicca symptoms in the middle of the night.  We discussed the option of taking 2 tablets by mouth at bedtime or 1 tablet at bedtime and 1 tablet in the middle the night as needed. We discussed the onset of action and max single dose being 10 mg.  She will try taking 10 mg a mouth at bedtime to see if it helps with her symptoms.  Potential side effects were discussed.  She does not need any refills of pilocarpine at this time.  We will check a CBC, CMP, and UA today.  We will continue to monitor her autoimmune lab work on a yearly basis.  She will follow-up in the office in 6 months. - Plan: CBC  with Differential/Platelet, COMPLETE METABOLIC PANEL WITH GFR, Urinalysis, Routine w reflex microscopic, CBC with Differential/Platelet, COMPLETE METABOLIC  PANEL WITH GFR, Urinalysis, Routine w reflex microscopic, C3 and C4, Anti-DNA antibody, double-stranded, Sedimentation rate, ANA  High risk medication use - She was started on PLQ in October 2019 but discontinued after 2 weeks due to not being able to tolerate tinnitus and constipation.   Positive ANA (antinuclear antibody) - August 11, 2018 AVISE index 0.2, ANA 1: 5120 homogeneous, SSA positive, dsDNA negative, Smith negative, RNP negative, SSB Negative, SCL 70 is negative: She continues to have chronic sicca symptoms but does not have any other features of autoimmune disease at this time.  We will continue to check autoimmune labs on a yearly basis.  Future orders were placed today.  She was advised to notify us if she develops any new or worsening symptom- Plan: Urinalysis, Routine w reflex microscopic, CBC with Differential/Platelet, COMPLETE METABOLIC PANEL WITH GFR, Urinalysis, Routine w reflex microscopic, C3 and C4, Anti-DNA antibody, double-stranded, Sedimentation rate, ANA  Other medical conditions are listed as follows:   Primary biliary cholangitis (HCC)   Vitamin D deficiency   History of stroke   Patent foramen ovale   History of hypothyroidism   History of hyperlipidemia   Orders: Orders Placed This Encounter  Procedures  . CBC with Differential/Platelet  . COMPLETE METABOLIC PANEL WITH GFR  . Urinalysis, Routine w reflex microscopic  . CBC with Differential/Platelet  . COMPLETE METABOLIC PANEL WITH GFR  . Urinalysis, Routine w reflex microscopic  . C3 and C4  . Anti-DNA antibody, double-stranded  . Sedimentation rate  . ANA   No orders of the defined types were placed in this encounter.   Follow-Up Instructions: Return in about 6 months (around 12/22/2019) for Sjogren's syndrome, Positive ANA.   Ofilia Neas, PA-C  Note - This record has been created using Dragon software.  Chart creation errors have been sought, but may not always  have been located. Such creation errors do not reflect on  the standard of medical care.

## 2019-06-16 ENCOUNTER — Ambulatory Visit: Payer: BLUE CROSS/BLUE SHIELD | Admitting: Physician Assistant

## 2019-06-21 ENCOUNTER — Other Ambulatory Visit: Payer: Self-pay

## 2019-06-21 ENCOUNTER — Ambulatory Visit: Payer: BC Managed Care – PPO | Admitting: Physician Assistant

## 2019-06-21 ENCOUNTER — Encounter: Payer: Self-pay | Admitting: Physician Assistant

## 2019-06-21 VITALS — BP 138/85 | HR 71 | Resp 12 | Ht 63.0 in | Wt 132.0 lb

## 2019-06-21 DIAGNOSIS — Q211 Atrial septal defect: Secondary | ICD-10-CM

## 2019-06-21 DIAGNOSIS — Z79899 Other long term (current) drug therapy: Secondary | ICD-10-CM

## 2019-06-21 DIAGNOSIS — Q2112 Patent foramen ovale: Secondary | ICD-10-CM

## 2019-06-21 DIAGNOSIS — Z8673 Personal history of transient ischemic attack (TIA), and cerebral infarction without residual deficits: Secondary | ICD-10-CM

## 2019-06-21 DIAGNOSIS — M3501 Sicca syndrome with keratoconjunctivitis: Secondary | ICD-10-CM

## 2019-06-21 DIAGNOSIS — E559 Vitamin D deficiency, unspecified: Secondary | ICD-10-CM

## 2019-06-21 DIAGNOSIS — K743 Primary biliary cirrhosis: Secondary | ICD-10-CM | POA: Diagnosis not present

## 2019-06-21 DIAGNOSIS — R768 Other specified abnormal immunological findings in serum: Secondary | ICD-10-CM

## 2019-06-21 DIAGNOSIS — Z8639 Personal history of other endocrine, nutritional and metabolic disease: Secondary | ICD-10-CM

## 2019-06-22 DIAGNOSIS — R7989 Other specified abnormal findings of blood chemistry: Secondary | ICD-10-CM

## 2019-06-22 LAB — COMPLETE METABOLIC PANEL WITH GFR
AG Ratio: 1.4 (calc) (ref 1.0–2.5)
ALT: 18 U/L (ref 6–29)
AST: 25 U/L (ref 10–35)
Albumin: 4.2 g/dL (ref 3.6–5.1)
Alkaline phosphatase (APISO): 97 U/L (ref 37–153)
BUN/Creatinine Ratio: 15 (calc) (ref 6–22)
BUN: 17 mg/dL (ref 7–25)
CO2: 31 mmol/L (ref 20–32)
Calcium: 10.4 mg/dL (ref 8.6–10.4)
Chloride: 103 mmol/L (ref 98–110)
Creat: 1.16 mg/dL — ABNORMAL HIGH (ref 0.50–0.99)
GFR, Est African American: 58 mL/min/{1.73_m2} — ABNORMAL LOW (ref >=60)
GFR, Est Non African American: 50 mL/min/{1.73_m2} — ABNORMAL LOW (ref >=60)
Globulin: 3 g/dL (calc) (ref 1.9–3.7)
Glucose, Bld: 73 mg/dL (ref 65–99)
Potassium: 4.3 mmol/L (ref 3.5–5.3)
Sodium: 140 mmol/L (ref 135–146)
Total Bilirubin: 0.4 mg/dL (ref 0.2–1.2)
Total Protein: 7.2 g/dL (ref 6.1–8.1)

## 2019-06-22 LAB — CBC WITH DIFFERENTIAL/PLATELET
Absolute Monocytes: 588 cells/uL (ref 200–950)
Basophils Absolute: 62 cells/uL (ref 0–200)
Basophils Relative: 1.1 %
Eosinophils Absolute: 90 cells/uL (ref 15–500)
Eosinophils Relative: 1.6 %
HCT: 39.9 % (ref 35.0–45.0)
Hemoglobin: 13 g/dL (ref 11.7–15.5)
Lymphs Abs: 1350 cells/uL (ref 850–3900)
MCH: 29.5 pg (ref 27.0–33.0)
MCHC: 32.6 g/dL (ref 32.0–36.0)
MCV: 90.7 fL (ref 80.0–100.0)
MPV: 12.4 fL (ref 7.5–12.5)
Monocytes Relative: 10.5 %
Neutro Abs: 3511 cells/uL (ref 1500–7800)
Neutrophils Relative %: 62.7 %
Platelets: 253 10*3/uL (ref 140–400)
RBC: 4.4 10*6/uL (ref 3.80–5.10)
RDW: 12.8 % (ref 11.0–15.0)
Total Lymphocyte: 24.1 %
WBC: 5.6 10*3/uL (ref 3.8–10.8)

## 2019-06-22 LAB — URINALYSIS, ROUTINE W REFLEX MICROSCOPIC
Bilirubin Urine: NEGATIVE
Glucose, UA: NEGATIVE
Hgb urine dipstick: NEGATIVE
Ketones, ur: NEGATIVE
Leukocytes,Ua: NEGATIVE
Nitrite: NEGATIVE
Protein, ur: NEGATIVE
Specific Gravity, Urine: 1.009 (ref 1.001–1.03)
pH: 6.5 (ref 5.0–8.0)

## 2019-06-22 NOTE — Progress Notes (Signed)
CBC WNL.  UA WNL. Creatinine is elevated-1.16 and GFR is 50. Please notify patient and advise her to avoid NSAIDs.  Forward labs to PCP.

## 2019-06-23 NOTE — Telephone Encounter (Signed)
So t his is a one time lab tests and indirect  Assessment in kidney function an reviewe of record shows nl kidney imaging in past  .and nl testing  So this is reasuring.  nsaids like advil aleve products can   Interfere with kidney function so avoid these for now.    I advise  Arrange   appt   For  Bmp  And urine protein creatinine ratio to be ordered and then  Do Virtual visit to review  Findings   Dx elevated creatinine

## 2019-06-25 ENCOUNTER — Other Ambulatory Visit: Payer: Self-pay

## 2019-06-25 ENCOUNTER — Other Ambulatory Visit (INDEPENDENT_AMBULATORY_CARE_PROVIDER_SITE_OTHER): Payer: BC Managed Care – PPO

## 2019-06-25 DIAGNOSIS — R7989 Other specified abnormal findings of blood chemistry: Secondary | ICD-10-CM | POA: Diagnosis not present

## 2019-06-25 LAB — BASIC METABOLIC PANEL
BUN: 11 mg/dL (ref 6–23)
CO2: 28 mEq/L (ref 19–32)
Calcium: 10 mg/dL (ref 8.4–10.5)
Chloride: 104 mEq/L (ref 96–112)
Creatinine, Ser: 0.8 mg/dL (ref 0.40–1.20)
GFR: 72.03 mL/min (ref >60.00)
Glucose, Bld: 76 mg/dL (ref 70–99)
Potassium: 4.5 mEq/L (ref 3.5–5.1)
Sodium: 139 mEq/L (ref 135–145)

## 2019-06-26 LAB — PROTEIN / CREATININE RATIO, URINE
Creatinine, Urine: 11 mg/dL — ABNORMAL LOW (ref 20–275)
Protein/Creat Ratio: 0 mg/g creat — ABNORMAL LOW (ref 21–161)
Protein/Creatinine Ratio: 0 mg/mg creat — ABNORMAL LOW (ref 0.021–0.16)
Total Protein, Urine: 0 mg/dL — ABNORMAL LOW (ref 5–24)

## 2019-06-30 ENCOUNTER — Encounter: Payer: Self-pay | Admitting: Rheumatology

## 2019-06-30 NOTE — Telephone Encounter (Signed)
It is good to know that her kidney function is back to normal.  Have been affected by the fluid intake.

## 2019-07-16 DIAGNOSIS — Z1231 Encounter for screening mammogram for malignant neoplasm of breast: Secondary | ICD-10-CM | POA: Diagnosis not present

## 2019-07-16 LAB — HM MAMMOGRAPHY

## 2019-07-19 DIAGNOSIS — M3500 Sicca syndrome, unspecified: Secondary | ICD-10-CM | POA: Diagnosis not present

## 2019-07-19 DIAGNOSIS — Z79899 Other long term (current) drug therapy: Secondary | ICD-10-CM | POA: Diagnosis not present

## 2019-07-19 DIAGNOSIS — H43812 Vitreous degeneration, left eye: Secondary | ICD-10-CM | POA: Diagnosis not present

## 2019-07-19 DIAGNOSIS — H2513 Age-related nuclear cataract, bilateral: Secondary | ICD-10-CM | POA: Diagnosis not present

## 2019-07-20 ENCOUNTER — Encounter: Payer: Self-pay | Admitting: Internal Medicine

## 2019-08-05 ENCOUNTER — Other Ambulatory Visit: Payer: Self-pay | Admitting: Rheumatology

## 2019-08-05 NOTE — Telephone Encounter (Signed)
Last Visit: 06/21/19 Next Visit due January 2021  Okay to refill per Dr. Estanislado Pandy

## 2019-08-17 ENCOUNTER — Encounter: Payer: Self-pay | Admitting: Internal Medicine

## 2019-08-30 NOTE — Telephone Encounter (Signed)
So due for pneumonia injections   Ok to give prevnar 13   And then next year get the pneumovax 23 . Should be ok to give  witth influenza inj

## 2019-08-31 ENCOUNTER — Other Ambulatory Visit: Payer: Self-pay | Admitting: Internal Medicine

## 2019-08-31 NOTE — Telephone Encounter (Signed)
Please advise Sir, thank you. 

## 2019-09-03 ENCOUNTER — Other Ambulatory Visit: Payer: Self-pay | Admitting: Internal Medicine

## 2019-09-03 MED ORDER — URSODIOL 250 MG PO TABS: 250.0000 mg | ORAL_TABLET | Freq: Two times a day (BID) | ORAL | 5 refills | Status: DC

## 2019-09-10 ENCOUNTER — Other Ambulatory Visit: Payer: Self-pay

## 2019-09-10 ENCOUNTER — Ambulatory Visit (INDEPENDENT_AMBULATORY_CARE_PROVIDER_SITE_OTHER): Payer: BC Managed Care – PPO

## 2019-09-10 ENCOUNTER — Other Ambulatory Visit (INDEPENDENT_AMBULATORY_CARE_PROVIDER_SITE_OTHER): Payer: BC Managed Care – PPO

## 2019-09-10 DIAGNOSIS — K743 Primary biliary cirrhosis: Secondary | ICD-10-CM

## 2019-09-10 DIAGNOSIS — Z23 Encounter for immunization: Secondary | ICD-10-CM | POA: Diagnosis not present

## 2019-09-10 LAB — HEPATIC FUNCTION PANEL
ALT: 17 U/L (ref 0–35)
AST: 18 U/L (ref 0–37)
Albumin: 4.4 g/dL (ref 3.5–5.2)
Alkaline Phosphatase: 127 U/L — ABNORMAL HIGH (ref 39–117)
Bilirubin, Direct: 0.1 mg/dL (ref 0.0–0.3)
Total Bilirubin: 0.6 mg/dL (ref 0.2–1.2)
Total Protein: 7.5 g/dL (ref 6.0–8.3)

## 2019-09-13 ENCOUNTER — Other Ambulatory Visit: Payer: Self-pay | Admitting: Internal Medicine

## 2019-09-13 DIAGNOSIS — K743 Primary biliary cirrhosis: Secondary | ICD-10-CM

## 2019-09-13 NOTE — Progress Notes (Signed)
Dana [name],  The alkaline phosphatase is slightly elevated - could be related to liver or other issues. The amount of elevation is not significant so I just want to recheck in 2 months.  Please come to my lab 12/14 or sometime in the 2 weeks after that to recheck.  Hope you are well and let me know if any ?  Best regards,  Gatha Mayer, MD, [facility]

## 2019-10-02 ENCOUNTER — Other Ambulatory Visit: Payer: Self-pay | Admitting: Rheumatology

## 2019-10-04 NOTE — Telephone Encounter (Signed)
Please schedule patient for a follow up visit. Patient due January 2021. Thanks! 

## 2019-10-04 NOTE — Telephone Encounter (Signed)
Last Visit: 06/21/19 Next Visit due January 2021. Message sent to the front to schedule patient.   Okay to refill per Dr. Estanislado Pandy

## 2019-10-04 NOTE — Telephone Encounter (Signed)
Called patient to schedule follow-up appointment in January.  Patient stated she will call back to schedule once she gets her new calendar.

## 2019-11-02 IMAGING — CT CT ANGIO CHEST
2 of 7 series · 13 of 36 positions shown · IV contrast (iopamidol)
Comparison: 03/01/2010

CLINICAL DATA: PAIN RT SHOULDER BLADE/RT BREAST,BURNING PAIN X 7
WKS RO PE, SHINGLES SX BREAST IMPLANTS NO OTHER HX

EXAM:
CT ANGIOGRAPHY CHEST WITH CONTRAST
TECHNIQUE: Multidetector CT imaging of the chest was performed using the
standard protocol during bolus administration of intravenous
contrast. Multiplanar CT image reconstructions and MIPs were
obtained to evaluate the vascular anatomy.
CONTRAST:  75mL Y0WLTH-M2T IOPAMIDOL (Y0WLTH-M2T) INJECTION 76%

[Series 6: cta pulmonary 2.00 bv36 s3 cor · coronal · 0.59mm/px · 1 of 133 slices shown]
[im 67/133  mediastinal]
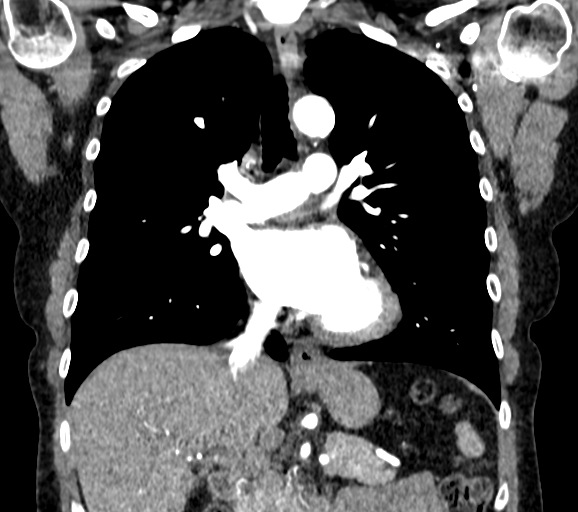

[Series 10: cta pulmonary 1.00 bv36 s3 thins · axial · 0.71mm/px · z∈[+1608,+1850]mm · 12 of 288 slices shown]
[im 23/288  lung]
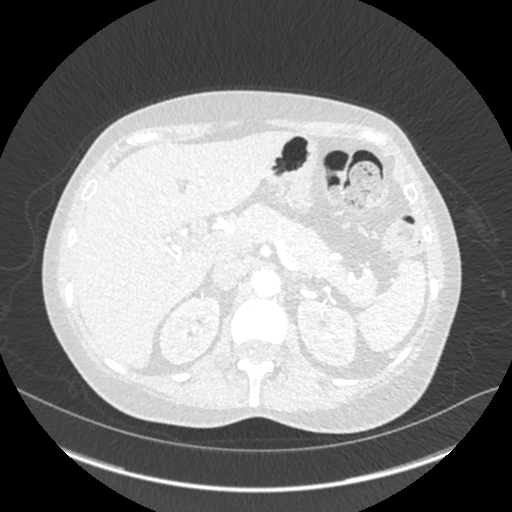
[im 45/288  mediastinal]
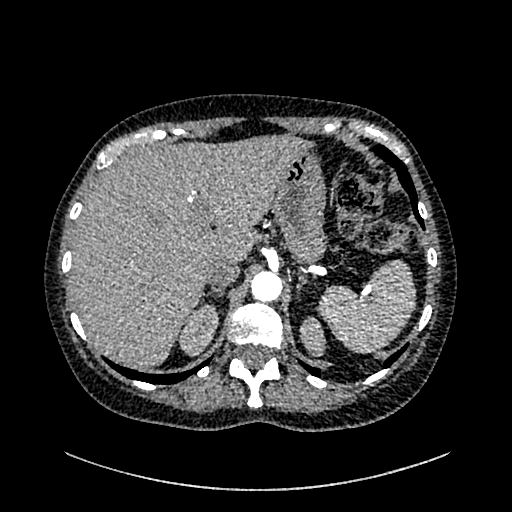
[im 67/288  lung]
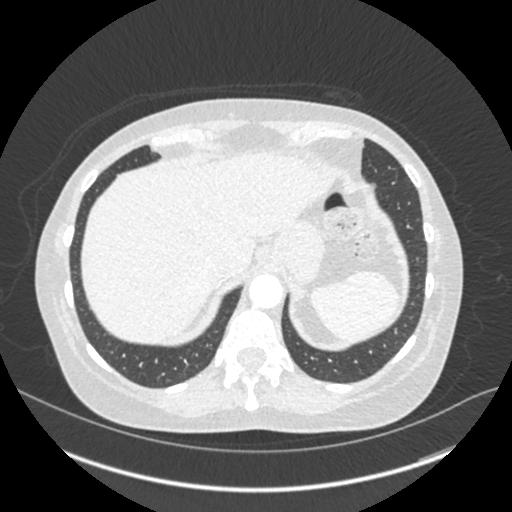
[im 89/288  mediastinal]
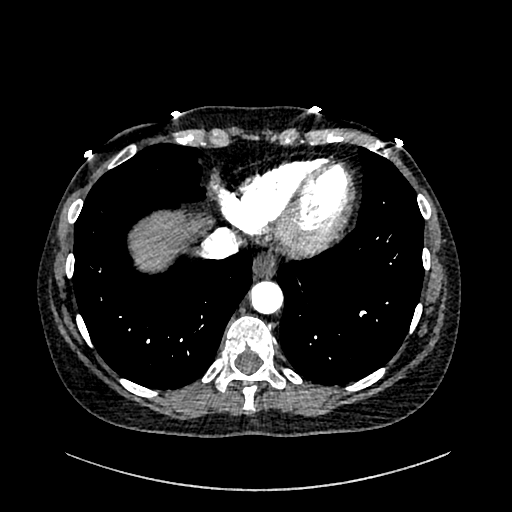
[im 111/288  lung]
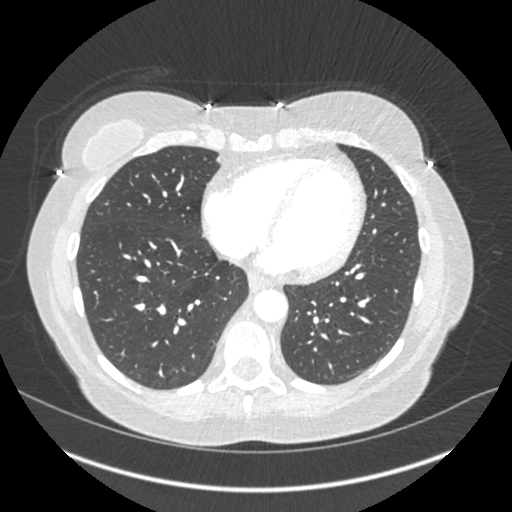
[im 133/288  mediastinal]
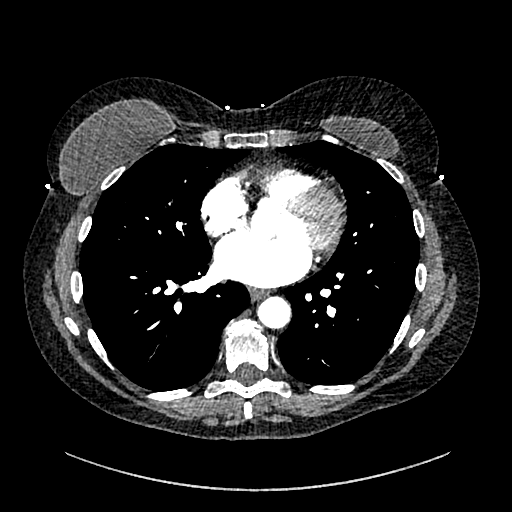
[im 155/288  lung]
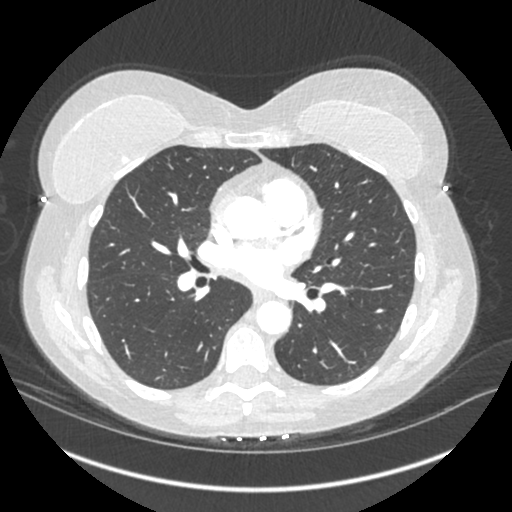
[im 177/288  mediastinal]
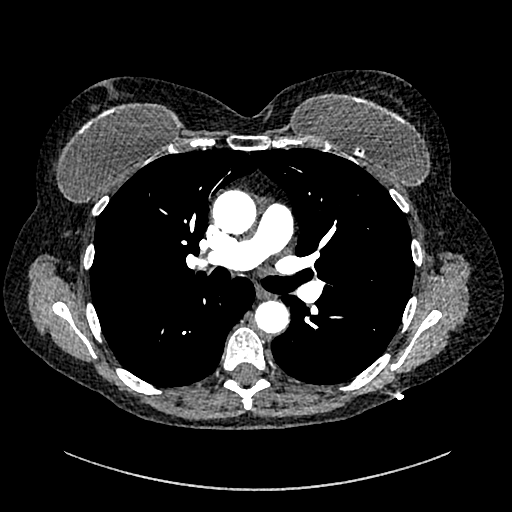
[im 199/288  lung]
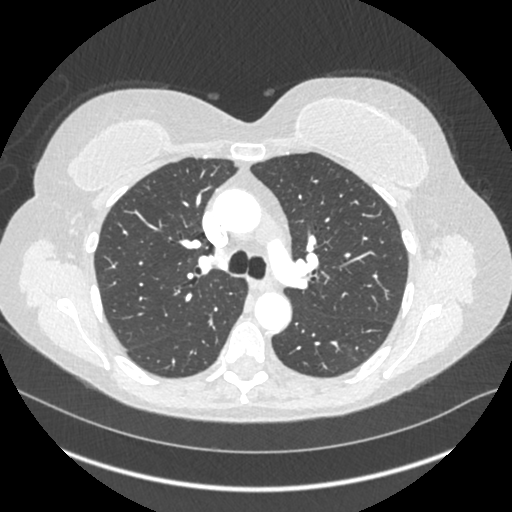
[im 221/288  mediastinal]
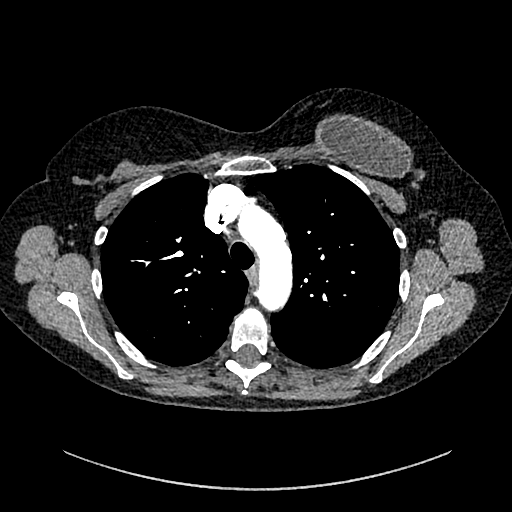
[im 243/288  lung]
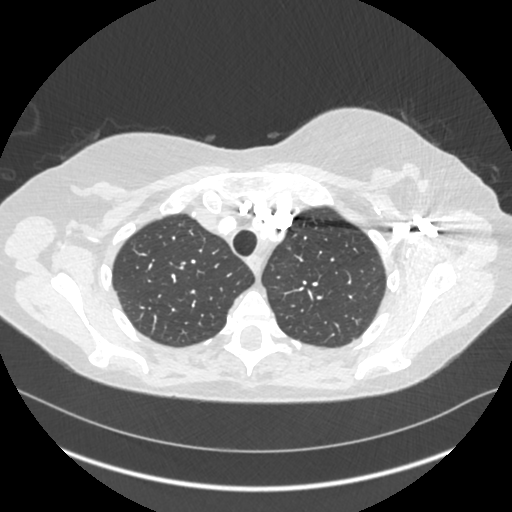
[im 265/288  mediastinal]
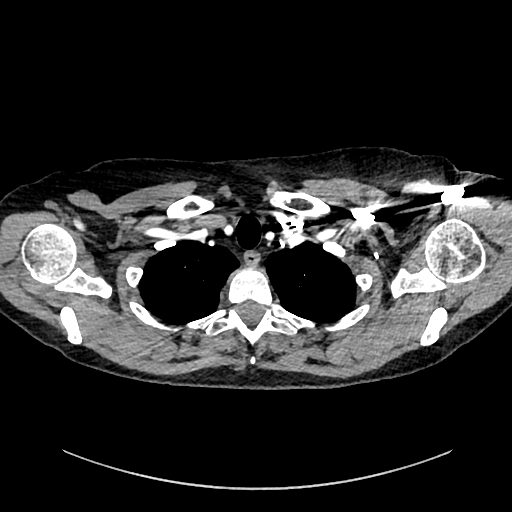

[13 of 36 positions shown; findings below may reference images not displayed]

FINDINGS: Cardiovascular: Heart size normal. No pericardial effusion.
Satisfactory opacification of pulmonary arteries noted, and there is
no evidence of pulmonary emboli. Scattered coronary calcifications.
Adequate contrast opacification of the thoracic aorta with no
evidence of dissection, aneurysm, or stenosis. There is patent
proximal brachiocephalic arch anatomy without proximal stenosis. The
left vertebral artery arises directly from the arch, an anatomic
variant. Scattered calcified plaque in the distal arch, descending
and proximal abdominal aortic segments.

Mediastinum/Nodes: No enlarged mediastinal, hilar, or axillary lymph
nodes. Thyroid gland, trachea, and esophagus demonstrate no
significant findings.

Lungs/Pleura: Lungs are clear. No pleural effusion or pneumothorax.

Upper Abdomen: No acute abnormality.

Musculoskeletal: Bilateral breast implants. Anterior vertebral
endplate spurring at multiple levels in the mid thoracic spine. No
fracture or worrisome bone lesion.

Review of the MIP images confirms the above findings.
IMPRESSION: 1. Negative for acute PE or thoracic aortic dissection.
2. Coronary and Aortic Atherosclerosis ([id number]-170.0)

## 2019-11-03 DIAGNOSIS — H2513 Age-related nuclear cataract, bilateral: Secondary | ICD-10-CM | POA: Diagnosis not present

## 2019-11-03 DIAGNOSIS — Z79899 Other long term (current) drug therapy: Secondary | ICD-10-CM | POA: Diagnosis not present

## 2019-11-03 DIAGNOSIS — M3500 Sicca syndrome, unspecified: Secondary | ICD-10-CM | POA: Diagnosis not present

## 2019-11-03 DIAGNOSIS — H43813 Vitreous degeneration, bilateral: Secondary | ICD-10-CM | POA: Diagnosis not present

## 2019-11-03 DIAGNOSIS — H43812 Vitreous degeneration, left eye: Secondary | ICD-10-CM | POA: Diagnosis not present

## 2019-11-12 ENCOUNTER — Other Ambulatory Visit (INDEPENDENT_AMBULATORY_CARE_PROVIDER_SITE_OTHER): Payer: BC Managed Care – PPO

## 2019-11-12 DIAGNOSIS — K743 Primary biliary cirrhosis: Secondary | ICD-10-CM

## 2019-11-12 DIAGNOSIS — L821 Other seborrheic keratosis: Secondary | ICD-10-CM | POA: Diagnosis not present

## 2019-11-12 DIAGNOSIS — L308 Other specified dermatitis: Secondary | ICD-10-CM | POA: Diagnosis not present

## 2019-11-12 DIAGNOSIS — X32XXXD Exposure to sunlight, subsequent encounter: Secondary | ICD-10-CM | POA: Diagnosis not present

## 2019-11-12 DIAGNOSIS — L570 Actinic keratosis: Secondary | ICD-10-CM | POA: Diagnosis not present

## 2019-11-12 LAB — HEPATIC FUNCTION PANEL
ALT: 18 U/L (ref 0–35)
AST: 20 U/L (ref 0–37)
Albumin: 4.2 g/dL (ref 3.5–5.2)
Alkaline Phosphatase: 116 U/L (ref 39–117)
Bilirubin, Direct: 0.1 mg/dL (ref 0.0–0.3)
Total Bilirubin: 0.4 mg/dL (ref 0.2–1.2)
Total Protein: 7.1 g/dL (ref 6.0–8.3)

## 2019-11-20 ENCOUNTER — Other Ambulatory Visit: Payer: Self-pay | Admitting: Rheumatology

## 2019-11-22 NOTE — Telephone Encounter (Signed)
Last Visit: 06/21/2019  Next Visit: message sent to the front desk to schedule.   Okay to refill per Dr. Estanislado Pandy.

## 2019-11-22 NOTE — Progress Notes (Signed)
Dana [name],  Labs back to normal.  [facility] you are feeling well.  Please do the following:  Call and schedule a follow-up for Feb or March   Best regards,  Gatha Mayer, MD, [facility]

## 2020-01-06 NOTE — Progress Notes (Signed)
Office Visit Note  Patient: Dana [name]             Date of Birth: May 15, 1956           MRN: [id number]             PCP: Burnis Medin, MD Referring: Burnis Medin, MD Visit Date: 01/14/2020 Occupation: @GUAROCC @  Subjective:  Sicca symptoms   History of Present Illness: Dana [name] is a 64 y.o. female with history of Sjogren's and positive ANA.  She has been taking pilocarpine 5 mg twice daily for sicca symptoms.  She uses eyedrops on a regular basis but has not needed any Biotene products recently for symptomatic relief.  She denies any oral nasal ulcerations.  Denies any recent rashes.  She denies any symptoms of Raynaud's.  She denies any joint pain or joint swelling at this time.  She has not had any morning stiffness.  She continues taking calcium and vitamin D supplement on a daily basis.  She states that she has been experiencing a dull headache and notices that her blood pressure is elevated today.  She has been experiencing occasional palpitations which she attributes to PVCs.  She would like lab work checked today.   Activities of Daily Living:  Patient reports morning stiffness for 0 minutes.   Patient Denies nocturnal pain.  Difficulty dressing/grooming: Denies Difficulty climbing stairs: Denies Difficulty getting out of chair: Denies Difficulty using hands for taps, buttons, cutlery, and/or writing: Denies  Review of Systems  Constitutional: Negative for fatigue.  HENT: Positive for mouth dryness. Negative for mouth sores and nose dryness.   Eyes: Positive for dryness. Negative for pain and visual disturbance.  Respiratory: Negative for cough, hemoptysis, shortness of breath and difficulty breathing.   Cardiovascular: Negative for chest pain, palpitations, hypertension and swelling in legs/feet.  Gastrointestinal: Negative for blood in stool, constipation and diarrhea.  Endocrine: Negative for increased urination.  Genitourinary: Negative for difficulty urinating  and painful urination.  Musculoskeletal: Negative for arthralgias, joint pain, joint swelling, myalgias, muscle weakness, morning stiffness, muscle tenderness and myalgias.  Skin: Negative for color change, pallor, rash, hair loss, nodules/bumps, skin tightness, ulcers and sensitivity to sunlight.  Allergic/Immunologic: Negative for susceptible to infections.  Neurological: Negative for dizziness, numbness, headaches, memory loss and weakness.  Hematological: Negative for bruising/bleeding tendency and swollen glands.  Psychiatric/Behavioral: Negative for depressed mood, confusion and sleep disturbance. The patient is not nervous/anxious.     PMFS History:  Patient Active Problem List   Diagnosis Date Noted  . Onychomycosis 08/05/2018  . Sjogren's disease ([place]) 07/13/2018  . Low bone density 07/13/2018  . Vitamin D deficiency 07/06/2018  . Primary biliary cholangitis ([place]) 04/08/2018  . Tetanus, diphtheria, and acellular pertussis (Tdap) vaccination declined 04/08/2018  . Trigger point of right shoulder region 03/24/2018  . Slipped rib syndrome 03/19/2018  . Nonallopathic lesion of rib cage 03/19/2018  . Nonallopathic lesion of thoracic region 03/19/2018  . Nonallopathic lesion of cervical region 03/19/2018  . Low serum vitamin D 04/09/2016  . PFO (patent foramen ovale)   . Abnormal finding on MRI of brain 01/10/2015  . Visit for preventive health examination 11/24/2014  . Bilateral arm weakness  hx of  11/23/2014  . Essential hypertension 11/23/2014  . Left paraspinal back pain 10/13/2013  . Chronic constipation 09/16/2013  . ESR raised 08/23/2013  . Right lower quadrant abdominal tenderness 04/05/2013  . Menopausal hot flushes 04/27/2012  . Other and unspecified hyperlipidemia 04/27/2012  .  Migraine with aura   . Hyperlipidemia 01/03/2012  . Hypothyroidism 03/21/2010  . HYPERTENSION, UNSPECIFIED 03/21/2010  . CEREBROVASCULAR ACCIDENT, HX OF 03/21/2010    Past Medical  History:  Diagnosis Date  . Abnormal finding on MRI of brain 01/10/2015  . CVA (cerebral infarction) 2010   diplopia neg acute MRi whit matter changes neg LP   . Elevated LFTs 11/23/2014  . High blood pressure    readings  . High cholesterol   . Hip pain, acute 10/13/2013  . Hx of varicella   . Low bone density 07/13/2018  . Migraine with aura    auras  . Non-cardiac chest pain    hops and evaluation 2011 dr Lia Foyer  . Other and unspecified hyperlipidemia 04/27/2012   Has been on meds for a number of years .  Low dose crestor .  Baseline reported as 280/   . PFO (patent foramen ovale)    Moderate size  . Pneumonia 2007   and flu hospitalized   . Primary biliary cholangitis ([place]) 04/08/2018  . Sjogren's disease ([place])   . Stroke [facility])    on hrt double vision- possible blood clot on brain; Dr. Floyde Parkins at [facility]   . Thyroid disease     Family History  Adopted: Yes  Problem Relation Age of Onset  . Cancer Mother        pancreatic  . Alcohol abuse Mother   . Breast cancer Maternal Aunt   . Healthy Daughter    Past Surgical History:  Procedure Laterality Date  . BREAST SURGERY     implants  . COLONOSCOPY    . FOOT SURGERY     had bunyon removed  . TONSILLECTOMY    . TOOTH EXTRACTION  11/30/2018  . 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    Neg tad   Some caffiene    Seatbelts no ets fa stored    G1P1   Physically active     31 yo grandaughter    Patient is left handed.      Immunization History  Administered Date(s) Administered  . Fluad Quad(high Dose 65+) 09/10/2019  . Influenza Split 09/08/2012  . Influenza,inj,Quad PF,6+ Mos 08/23/2013, 11/16/2014, 09/07/2015, 08/23/2016, 09/02/2017, 09/08/2018  . Pneumococcal Conjugate-13  04/26/2011, 09/10/2019     Objective: Vital Signs: BP (!) 168/88 (BP Location: Left Arm, Patient Position: Sitting, Cuff Size: Normal)   Pulse (!) 57   Resp 12   Ht 5' 3"  (1.6 m)   Wt 136 lb (61.7 kg)   LMP 04/25/2009   BMI 24.09 kg/m    Physical Exam Vitals and nursing note reviewed.  Constitutional:      Appearance: She is well-developed.  HENT:     Head: Normocephalic and atraumatic.  Eyes:     Conjunctiva/sclera: Conjunctivae normal.  Cardiovascular:     Rate and Rhythm: Normal rate and regular rhythm.     Heart sounds: Normal heart sounds.  Pulmonary:     Effort: Pulmonary effort is normal.     Breath sounds: Normal breath sounds.  Abdominal:     General: Bowel sounds are normal.     Palpations: Abdomen  is soft.  Musculoskeletal:     Cervical back: Normal range of motion.  Lymphadenopathy:     Cervical: No cervical adenopathy.  Skin:    General: Skin is warm and dry.     Capillary Refill: Capillary refill takes less than 2 seconds.  Neurological:     Mental Status: She is alert and oriented to person, place, and time.  Psychiatric:        Behavior: Behavior normal.      Musculoskeletal Exam: C-spine, thoracic spine, lumbar spine good range of motion.  No midline spinal tenderness.  No SI joint tenderness.  Shoulder joints, elbows, wrist joints, MCPs and PIPs and DIPs good range of motion no synovitis.  She has complete fist formation bilaterally.  Hip joints, knee joints, ankle joints, MTPs, PIPs and DIPs good range of motion no synovitis.  No warmth or effusion of knee joints.  No tenderness of swelling of ankle joints.   CDAI Exam: CDAI Score: - Patient Global: -; Provider Global: - Swollen: -; Tender: - Joint Exam 01/14/2020   No joint exam has been documented for this visit   There is currently no information documented on the homunculus. Go to the Rheumatology activity and complete the homunculus joint exam.  Investigation: No additional findings.   Imaging: No results found.  Recent Labs: Lab Results  Component Value Date   WBC 5.6 06/21/2019   HGB 13.0 06/21/2019   PLT 253 06/21/2019   NA 139 06/25/2019   K 4.5 06/25/2019   CL 104 06/25/2019   CO2 28 06/25/2019   GLUCOSE 76 06/25/2019   BUN 11 06/25/2019   CREATININE 0.80 06/25/2019   BILITOT 0.4 11/12/2019   ALKPHOS 116 11/12/2019   AST 20 11/12/2019   ALT 18 11/12/2019   PROT 7.1 11/12/2019   ALBUMIN 4.2 11/12/2019   CALCIUM 10.0 06/25/2019   GFRAA 58 (L) 06/21/2019    Speciality Comments: PLQ Eye Exam: 09/25/18 WNL @ [facility] follow up 1 year  Procedures:  No procedures performed Allergies: Estrogens, Prednisone, Codeine, and Ibuprofen   Assessment / Plan:     Visit Diagnoses: Sjogren's syndrome with keratoconjunctivitis sicca (HCC) - ANA+, Ro+: She continues to have chronic sicca symptoms.  She takes pilocarpine 5 mg BID for symptomatic relief.  She uses eyedrops as needed and has not needed to use Biotene products recently.  She will continue taking pilocarpine as prescribed.  She does not need any refills at this time.  She has no joint pain or synovitis on exam.  She has no other clinical features of autoimmune disease at this time.  We will check a CBC, CMP, ANA, Ro, La, SPEP, and RF.  She was advised to notify us if she develops any new or worsening symptoms.  She will follow-up in the office in 6 months.- Plan: COMPLETE METABOLIC PANEL WITH GFR, CBC with Differential/Platelet, ANA, Sjogrens syndrome-A extractable nuclear antibody, Sjogrens syndrome-B extractable nuclear antibody, Rheumatoid factor, Serum protein electrophoresis with reflex  High risk medication use - She was started on PLQ in October 2019 but discontinued after 2 weeks due to not being able to tolerate tinnitus and constipation.   Positive ANA (antinuclear antibody) - August 11, 2018 AVISE index 0.2, ANA 1: 5120 homogeneous, SSA positive, dsDNA negative, Smith negative, RNP  negative, SSB Negative, SCL 70 is negative: She continues to have chronic sicca symptoms.  She has no other clinical features of autoimmune disease at this time.  We will check ANA, Ro,  La, RF, and SPEP today.  She is advised to notify us if develops any new or worsening symptoms  Primary biliary cholangitis [facility]): She is followed by Dr. Carlean Purl.  Vitamin D deficiency: She is taking calcium and vitamin D supplement on a daily basis.  Other medical conditions are listed as follows:   History of stroke  History of hyperlipidemia  History of hypothyroidism  Patent foramen ovale  Orders: Orders Placed This Encounter  Procedures  . COMPLETE METABOLIC PANEL WITH GFR  . CBC with Differential/Platelet  . ANA  . Sjogrens syndrome-A extractable nuclear antibody  . Sjogrens syndrome-B extractable nuclear antibody  . Rheumatoid factor  . Serum protein electrophoresis with reflex   No orders of the defined types were placed in this encounter.     Follow-Up Instructions: Return in about 6 months (around 07/13/2020) for Sjogren's syndrome.   Ofilia Neas, PA-C  Note - This record has been created using Dragon software.  Chart creation errors have been sought, but may not always  have been located. Such creation errors do not reflect on  the standard of medical care.

## 2020-01-08 ENCOUNTER — Other Ambulatory Visit: Payer: Self-pay | Admitting: Rheumatology

## 2020-01-10 NOTE — Telephone Encounter (Signed)
Last Visit: 06/21/2019 Next Visit: 01/14/2020  Okay to refill per Dr. Corliss Skains.

## 2020-01-14 ENCOUNTER — Encounter: Payer: Self-pay | Admitting: Physician Assistant

## 2020-01-14 ENCOUNTER — Other Ambulatory Visit: Payer: Self-pay

## 2020-01-14 ENCOUNTER — Ambulatory Visit: Payer: BC Managed Care – PPO | Admitting: Physician Assistant

## 2020-01-14 VITALS — BP 168/88 | HR 57 | Resp 12 | Ht 63.0 in | Wt 136.0 lb

## 2020-01-14 DIAGNOSIS — E559 Vitamin D deficiency, unspecified: Secondary | ICD-10-CM

## 2020-01-14 DIAGNOSIS — K743 Primary biliary cirrhosis: Secondary | ICD-10-CM | POA: Diagnosis not present

## 2020-01-14 DIAGNOSIS — R768 Other specified abnormal immunological findings in serum: Secondary | ICD-10-CM | POA: Diagnosis not present

## 2020-01-14 DIAGNOSIS — M3501 Sicca syndrome with keratoconjunctivitis: Secondary | ICD-10-CM | POA: Diagnosis not present

## 2020-01-14 DIAGNOSIS — Z79899 Other long term (current) drug therapy: Secondary | ICD-10-CM | POA: Diagnosis not present

## 2020-01-14 DIAGNOSIS — Q2112 Patent foramen ovale: Secondary | ICD-10-CM

## 2020-01-14 DIAGNOSIS — Q211 Atrial septal defect: Secondary | ICD-10-CM

## 2020-01-14 DIAGNOSIS — Z8639 Personal history of other endocrine, nutritional and metabolic disease: Secondary | ICD-10-CM

## 2020-01-14 DIAGNOSIS — Z8673 Personal history of transient ischemic attack (TIA), and cerebral infarction without residual deficits: Secondary | ICD-10-CM

## 2020-01-18 LAB — SJOGRENS SYNDROME-A EXTRACTABLE NUCLEAR ANTIBODY: SSA (Ro) (ENA) Antibody, IgG: <1 AI

## 2020-01-18 LAB — CBC WITH DIFFERENTIAL/PLATELET
Absolute Monocytes: 465 cells/uL (ref 200–950)
Basophils Absolute: 40 cells/uL (ref 0–200)
Basophils Relative: 0.8 %
Eosinophils Absolute: 130 cells/uL (ref 15–500)
Eosinophils Relative: 2.6 %
HCT: 38.3 % (ref 35.0–45.0)
Hemoglobin: 12.7 g/dL (ref 11.7–15.5)
Lymphs Abs: 1105 cells/uL (ref 850–3900)
MCH: 30.6 pg (ref 27.0–33.0)
MCHC: 33.2 g/dL (ref 32.0–36.0)
MCV: 92.3 fL (ref 80.0–100.0)
MPV: 12.4 fL (ref 7.5–12.5)
Monocytes Relative: 9.3 %
Neutro Abs: 3260 cells/uL (ref 1500–7800)
Neutrophils Relative %: 65.2 %
Platelets: 222 10*3/uL (ref 140–400)
RBC: 4.15 10*6/uL (ref 3.80–5.10)
RDW: 13 % (ref 11.0–15.0)
Total Lymphocyte: 22.1 %
WBC: 5 10*3/uL (ref 3.8–10.8)

## 2020-01-18 LAB — ANTI-NUCLEAR AB-TITER (ANA TITER)
ANA TITER: 1:1280 {titer} — ABNORMAL HIGH
ANA TITER: 1:320 {titer} — ABNORMAL HIGH
ANA Titer 1: 1:320 {titer} — ABNORMAL HIGH

## 2020-01-18 LAB — COMPLETE METABOLIC PANEL WITH GFR
AG Ratio: 1.5 (calc) (ref 1.0–2.5)
ALT: 16 U/L (ref 6–29)
AST: 19 U/L (ref 10–35)
Albumin: 4.3 g/dL (ref 3.6–5.1)
Alkaline phosphatase (APISO): 120 U/L (ref 37–153)
BUN: 11 mg/dL (ref 7–25)
CO2: 26 mmol/L (ref 20–32)
Calcium: 10.2 mg/dL (ref 8.6–10.4)
Chloride: 105 mmol/L (ref 98–110)
Creat: 0.8 mg/dL (ref 0.50–0.99)
GFR, Est African American: 90 mL/min/{1.73_m2} (ref >=60)
GFR, Est Non African American: 77 mL/min/{1.73_m2} (ref >=60)
Globulin: 2.8 g/dL (calc) (ref 1.9–3.7)
Glucose, Bld: 79 mg/dL (ref 65–99)
Potassium: 4.2 mmol/L (ref 3.5–5.3)
Sodium: 140 mmol/L (ref 135–146)
Total Bilirubin: 0.5 mg/dL (ref 0.2–1.2)
Total Protein: 7.1 g/dL (ref 6.1–8.1)

## 2020-01-18 LAB — PROTEIN ELECTROPHORESIS, SERUM, WITH REFLEX
Albumin ELP: 3.8 g/dL (ref 3.8–4.8)
Alpha 1: 0.4 g/dL — ABNORMAL HIGH (ref 0.2–0.3)
Alpha 2: 0.9 g/dL (ref 0.5–0.9)
Beta 2: 0.4 g/dL (ref 0.2–0.5)
Beta Globulin: 0.4 g/dL (ref 0.4–0.6)
Gamma Globulin: 1 g/dL (ref 0.8–1.7)
Total Protein: 6.9 g/dL (ref 6.1–8.1)

## 2020-01-18 LAB — ANA: Anti Nuclear Antibody (ANA): POSITIVE — AB

## 2020-01-18 LAB — SJOGRENS SYNDROME-B EXTRACTABLE NUCLEAR ANTIBODY: SSB (La) (ENA) Antibody, IgG: <1 AI

## 2020-01-18 LAB — RHEUMATOID FACTOR: Rheumatoid fact SerPl-aCnc: <14 IU/mL (ref <14)

## 2020-01-18 LAB — IFE INTERPRETATION: Immunofix Electr Int: NOT DETECTED

## 2020-01-18 NOTE — Progress Notes (Signed)
IFE did not reveal any monoclonal proteins.   CBC and CMP WNL.  ANA titer stable.  Ro, La, and RF are negative.

## 2020-01-18 NOTE — Telephone Encounter (Signed)
She has a history of positive ANA and Ro antibody, so she previously met criteria for Sjogren's syndrome.  She continues to have a positive ANA but Ro is negative. These antibodies do not correlate with disease activity, so there is no change in therapy recommended at this time. We will continue to monitor these antibodies on a yearly basis.

## 2020-02-16 ENCOUNTER — Ambulatory Visit: Payer: BC Managed Care – PPO | Admitting: Internal Medicine

## 2020-02-16 ENCOUNTER — Encounter: Payer: Self-pay | Admitting: Internal Medicine

## 2020-02-16 ENCOUNTER — Other Ambulatory Visit (INDEPENDENT_AMBULATORY_CARE_PROVIDER_SITE_OTHER): Payer: BC Managed Care – PPO

## 2020-02-16 ENCOUNTER — Other Ambulatory Visit: Payer: Self-pay

## 2020-02-16 VITALS — BP 146/86 | HR 90 | Temp 98.3°F | Ht 63.0 in | Wt 133.0 lb

## 2020-02-16 DIAGNOSIS — E559 Vitamin D deficiency, unspecified: Secondary | ICD-10-CM

## 2020-02-16 DIAGNOSIS — R030 Elevated blood-pressure reading, without diagnosis of hypertension: Secondary | ICD-10-CM | POA: Diagnosis not present

## 2020-02-16 DIAGNOSIS — K743 Primary biliary cirrhosis: Secondary | ICD-10-CM

## 2020-02-16 DIAGNOSIS — M8580 Other specified disorders of bone density and structure, unspecified site: Secondary | ICD-10-CM

## 2020-02-16 DIAGNOSIS — M859 Disorder of bone density and structure, unspecified: Secondary | ICD-10-CM

## 2020-02-16 LAB — PROTIME-INR
INR: 1 ratio (ref 0.8–1.0)
Prothrombin Time: 11.2 s (ref 9.6–13.1)

## 2020-02-16 LAB — VITAMIN D 25 HYDROXY (VIT D DEFICIENCY, FRACTURES): VITD: 33.41 ng/mL (ref 30.00–100.00)

## 2020-02-16 NOTE — Patient Instructions (Signed)
Your provider has requested that you go to the basement level for lab work before leaving today. Press "B" on the elevator. The lab is located at the first door on the left as you exit the elevator.   Follow up with Dr Leone Payor in a year or sooner if needed.    I appreciate the opportunity to care for you. Stan Head, MD, [facility]

## 2020-02-16 NOTE — Assessment & Plan Note (Signed)
I have asked her to keep a record of blood pressures at home and review with Dr. Fabian Sharp when she sees her

## 2020-02-16 NOTE — Assessment & Plan Note (Signed)
Seems to be doing well on the ursodeoxycholic acid with normal LFTs.  Check vitamin A D and INR today.  Continue labs through rheumatology as she is and I will review those as they are done.  See me in a year routinely.

## 2020-02-16 NOTE — Assessment & Plan Note (Signed)
Rechecking level she is on supplementation

## 2020-02-16 NOTE — Progress Notes (Signed)
Dana [name] 64 y.o. Jan 08, 1956 [id number]  Assessment & Plan:  Primary biliary cholangitis (HCC) Seems to be doing well on the ursodeoxycholic acid with normal LFTs.  Check vitamin A D and INR today.  Continue labs through rheumatology as she is and I will review those as they are done.  See me in a year routinely.  Vitamin D deficiency Rechecking level she is on supplementation  Low bone density Would repeat a DEXA scan late 2021 or early 2022  Elevated blood pressure reading without diagnosis of hypertension I have asked her to keep a record of blood pressures at home and review with Dr. Regis [name] when she sees her    CC: Panosh, Standley Brooking, MD   Subjective:   Chief Complaint: Primary biliary cholangitis  HPI Dana [name] is here for follow-up of her primary biliary cholangitis.  Her LFTs have remained normal on ursodeoxycholic acid since early on with her diagnosis.  She did have some labs run at rheumatology that she wanted to go over she was concerned about a very small elevation in the alpha band of an SPEP and I explained that her IFE was normal no monoclonal proteins and this was not of concern.  Some fatigue she does remain busy she continues to work and takes care of her house cooking cleaning etc.  She has been watching her blood pressure and has been mildly elevated at times. Allergies  Allergen Reactions  . Estrogens Other (See Comments)    Had stroke on HRT/OCP  . Prednisone Other (See Comments)    Jittery and irritable hard to sleep with oral pred  . Codeine     REACTION: nausea  . Ibuprofen     REACTION: nause   Current Meds  Medication Sig  . aspirin 81 MG tablet Take 81 mg by mouth daily.  Marland Kitchen atorvastatin (LIPITOR) 20 MG tablet Take 1 tablet (20 mg total) by mouth daily.  . Calcium Carbonate-Vit D-Min (CALCIUM 1200 PO) Take by mouth daily.  . cholecalciferol (VITAMIN D) 1000 units tablet Take 1,000 Units by mouth daily.  Marland Kitchen levothyroxine (SYNTHROID) 75 MCG tablet  Take 1 tablet (75 mcg total) by mouth daily.  . pilocarpine (SALAGEN) 5 MG tablet TAKE (1) TABLET THREE TIMES DAILY AS NEEDED.  Marland Kitchen ursodiol (ACTIGALL) 250 MG tablet Take 1 tablet (250 mg total) by mouth 2 (two) times daily.   Past Medical History:  Diagnosis Date  . Abnormal finding on MRI of brain 01/10/2015  . CVA (cerebral infarction) 2010   diplopia neg acute MRi whit matter changes neg LP   . Elevated LFTs 11/23/2014  . High blood pressure    readings  . High cholesterol   . Hip pain, acute 10/13/2013  . Hx of varicella   . Low bone density 07/13/2018  . Migraine with aura    auras  . Non-cardiac chest pain    hops and evaluation 2011 dr Dana [name]  . Other and unspecified hyperlipidemia 04/27/2012   Has been on meds for a number of years .  Low dose crestor .  Baseline reported as 280/   . PFO (patent foramen ovale)    Moderate size  . Pneumonia 2007   and flu hospitalized   . Primary biliary cholangitis ([place]) 04/08/2018  . Sjogren's disease ([place])   . Stroke [facility])    on hrt double vision- possible blood clot on brain; Dr. Floyde [name] at [facility]   . Thyroid disease    Past Surgical History:  Procedure Laterality Date  . BREAST SURGERY     implants  . COLONOSCOPY    . FOOT SURGERY     had bunyon removed  . TONSILLECTOMY    . TOOTH EXTRACTION  11/30/2018  . 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    Neg tad   Some caffiene    Seatbelts no ets fa stored    G1P1   Physically active     5 yo grandaughter    Patient is left handed.      family history includes Alcohol abuse in her mother; Breast cancer in her maternal aunt; Cancer in her mother; Healthy in her daughter. She was adopted.   Review of Systems As above  Objective:    Physical Exam @BP  (!) 146/86   Pulse 90   Temp 98.3 F (36.8 C)   Ht 5\' 3"  (1.6 m)   Wt 133 lb (60.3 kg)   LMP 04/25/2009   BMI 23.56 kg/m @  General:  NAD Eyes:   anicteric Lungs:  clear Heart::  S1S2 no rubs, murmurs or gallops Abdomen:  soft and nontender, BS+ Ext:   no edema, cyanosis or clubbing    Data Reviewed:   Labs in EMR

## 2020-02-16 NOTE — Assessment & Plan Note (Signed)
Would repeat a DEXA scan late 2021 or early 2022

## 2020-02-19 LAB — VITAMIN A: Vitamin A (Retinoic Acid): 43 ug/dL (ref 38–98)

## 2020-02-26 ENCOUNTER — Other Ambulatory Visit: Payer: Self-pay | Admitting: Rheumatology

## 2020-02-28 NOTE — Telephone Encounter (Signed)
Last Visit: 01/14/20 Next Visit due August 2021. Message sent to the front to schedule.   Okay to refill per Dr. Corliss Skains

## 2020-02-28 NOTE — Telephone Encounter (Signed)
Called patient to schedule a 6 month follow-up appointment with Dr. Corliss Skains.  Patient states she "usually schedules once a year" and would like to schedule in February 2022.  Patient states she doesn't have her calendar and will call back in November to schedule a 1 year follow-up appointment.

## 2020-02-28 NOTE — Telephone Encounter (Signed)
Please schedule patient for a follow up visit. Patient due August 2021. Thanks! 

## 2020-03-17 ENCOUNTER — Other Ambulatory Visit: Payer: Self-pay | Admitting: Internal Medicine

## 2020-04-14 ENCOUNTER — Other Ambulatory Visit: Payer: Self-pay | Admitting: Rheumatology

## 2020-04-14 NOTE — Telephone Encounter (Signed)
Last Visit: 01/14/2020  Next Visit: message sent to the front desk to schedule.   Okay to refill per Dr. Corliss Skains.

## 2020-04-28 ENCOUNTER — Other Ambulatory Visit: Payer: Self-pay | Admitting: Internal Medicine

## 2020-05-01 ENCOUNTER — Encounter: Payer: Self-pay | Admitting: Family Medicine

## 2020-05-01 ENCOUNTER — Other Ambulatory Visit: Payer: Self-pay

## 2020-05-01 ENCOUNTER — Ambulatory Visit (INDEPENDENT_AMBULATORY_CARE_PROVIDER_SITE_OTHER): Payer: BC Managed Care – PPO | Admitting: Family Medicine

## 2020-05-01 VITALS — BP 124/70 | HR 82 | Temp 97.8°F | Wt 129.0 lb

## 2020-05-01 DIAGNOSIS — E039 Hypothyroidism, unspecified: Secondary | ICD-10-CM | POA: Diagnosis not present

## 2020-05-01 DIAGNOSIS — R002 Palpitations: Secondary | ICD-10-CM

## 2020-05-01 DIAGNOSIS — Z419 Encounter for procedure for purposes other than remedying health state, unspecified: Secondary | ICD-10-CM

## 2020-05-01 DIAGNOSIS — R6884 Jaw pain: Secondary | ICD-10-CM

## 2020-05-01 LAB — BASIC METABOLIC PANEL
BUN: 10 mg/dL (ref 6–23)
CO2: 26 mEq/L (ref 19–32)
Calcium: 9.5 mg/dL (ref 8.4–10.5)
Chloride: 104 mEq/L (ref 96–112)
Creatinine, Ser: 0.79 mg/dL (ref 0.40–1.20)
GFR: 72.89 mL/min (ref >60.00)
Glucose, Bld: 77 mg/dL (ref 70–99)
Potassium: 4.2 mEq/L (ref 3.5–5.1)
Sodium: 138 mEq/L (ref 135–145)

## 2020-05-01 LAB — CBC WITH DIFFERENTIAL/PLATELET
Basophils Absolute: 0 10*3/uL (ref 0.0–0.1)
Basophils Relative: 0.4 % (ref 0.0–3.0)
Eosinophils Absolute: 0.1 10*3/uL (ref 0.0–0.7)
Eosinophils Relative: 1.4 % (ref 0.0–5.0)
HCT: 35.9 % — ABNORMAL LOW (ref 36.0–46.0)
Hemoglobin: 12 g/dL (ref 12.0–15.0)
Lymphocytes Relative: 10 % — ABNORMAL LOW (ref 12.0–46.0)
Lymphs Abs: 0.7 10*3/uL (ref 0.7–4.0)
MCHC: 33.5 g/dL (ref 30.0–36.0)
MCV: 90.5 fl (ref 78.0–100.0)
Monocytes Absolute: 0.7 10*3/uL (ref 0.1–1.0)
Monocytes Relative: 9.7 % (ref 3.0–12.0)
Neutro Abs: 5.8 10*3/uL (ref 1.4–7.7)
Neutrophils Relative %: 78.5 % — ABNORMAL HIGH (ref 43.0–77.0)
Platelets: 228 10*3/uL (ref 150.0–400.0)
RBC: 3.96 Mil/uL (ref 3.87–5.11)
RDW: 13.9 % (ref 11.5–15.5)
WBC: 7.4 10*3/uL (ref 4.0–10.5)

## 2020-05-01 LAB — TSH: TSH: 2.32 u[IU]/mL (ref 0.35–4.50)

## 2020-05-01 NOTE — Patient Instructions (Addendum)
Palpitations Palpitations are feelings that your heartbeat is irregular or is faster than normal. It may feel like your heart is fluttering or skipping a beat. Palpitations are usually not a serious problem. They may be caused by many things, including smoking, caffeine, alcohol, stress, and certain medicines or drugs. Most causes of palpitations are not serious. However, some palpitations can be a sign of a serious problem. You may need further tests to rule out serious medical problems. Follow these instructions at home:     Pay attention to any changes in your condition. Take these actions to help manage your symptoms: Eating and drinking  Avoid foods and drinks that may cause palpitations. These may include: ? Caffeinated coffee, tea, soft drinks, diet pills, and energy drinks. ? Chocolate. ? Alcohol. Lifestyle  Take steps to reduce your stress and anxiety. Things that can help you relax include: ? Yoga. ? Mind-body activities, such as deep breathing, meditation, or using words and images to create positive thoughts (guided imagery). ? Physical activity, such as swimming, jogging, or walking. Tell your health care provider if your palpitations increase with activity. If you have chest pain or shortness of breath with activity, do not continue the activity until you are seen by your health care provider. ? Biofeedback. This is a method that helps you learn to use your mind to control things in your body, such as your heartbeat.  Do not use drugs, including cocaine or ecstasy. Do not use marijuana.  Get plenty of rest and sleep. Keep a regular bed time. General instructions  Take over-the-counter and prescription medicines only as told by your health care provider.  Do not use any products that contain nicotine or tobacco, such as cigarettes and e-cigarettes. If you need help quitting, ask your health care provider.  Keep all follow-up visits as told by your health care provider. This  is important. These may include visits for further testing if palpitations do not go away or get worse. Contact a health care provider if you:  Continue to have a fast or irregular heartbeat after 24 hours.  Notice that your palpitations occur more often. Get help right away if you:  Have chest pain or shortness of breath.  Have a severe headache.  Feel dizzy or you faint. Summary  Palpitations are feelings that your heartbeat is irregular or is faster than normal. It may feel like your heart is fluttering or skipping a beat.  Palpitations may be caused by many things, including smoking, caffeine, alcohol, stress, certain medicines, and drugs.  Although most causes of palpitations are not serious, some causes can be a sign of a serious medical problem.  Get help right away if you faint or have chest pain, shortness of breath, a severe headache, or dizziness. This information is not intended to replace advice given to you by your health care provider. Make sure you discuss any questions you have with your health care provider. Document Revised: 12/24/2017 Document Reviewed: 12/24/2017 Elsevier Patient Education  2020 [employer].  Hypothyroidism  Hypothyroidism is when the thyroid gland does not make enough of certain hormones (it is underactive). The thyroid gland is a small gland located in the lower front part of the neck, just in front of the windpipe (trachea). This gland makes hormones that help control how the body uses food for energy (metabolism) as well as how the heart and brain function. These hormones also play a role in keeping your bones strong. When the thyroid is underactive,  it produces too little of the hormones thyroxine (T4) and triiodothyronine (T3). What are the causes? This condition may be caused by:  Hashimoto's disease. This is a disease in which the body's disease-fighting system (immune system) attacks the thyroid gland. This is the most common  cause.  Viral infections.  Pregnancy.  Certain medicines.  Birth defects.  Past radiation treatments to the head or neck for cancer.  Past treatment with radioactive iodine.  Past exposure to radiation in the environment.  Past surgical removal of part or all of the thyroid.  Problems with a gland in the center of the brain (pituitary gland).  Lack of enough iodine in the diet. What increases the risk? You are more likely to develop this condition if:  You are female.  You have a family history of thyroid conditions.  You use a medicine called lithium.  You take medicines that affect the immune system (immunosuppressants). What are the signs or symptoms? Symptoms of this condition include:  Feeling as though you have no energy (lethargy).  Not being able to tolerate cold.  Weight gain that is not explained by a change in diet or exercise habits.  Lack of appetite.  Dry skin.  Coarse hair.  Menstrual irregularity.  Slowing of thought processes.  Constipation.  Sadness or depression. How is this diagnosed? This condition may be diagnosed based on:  Your symptoms, your medical history, and a physical exam.  Blood tests. You may also have imaging tests, such as an ultrasound or MRI. How is this treated? This condition is treated with medicine that replaces the thyroid hormones that your body does not make. After you begin treatment, it may take several weeks for symptoms to go away. Follow these instructions at home:  Take over-the-counter and prescription medicines only as told by your health care provider.  If you start taking any new medicines, tell your health care provider.  Keep all follow-up visits as told by your health care provider. This is important. ? As your condition improves, your dosage of thyroid hormone medicine may change. ? You will need to have blood tests regularly so that your health care provider can monitor your  condition. Contact a health care provider if:  Your symptoms do not get better with treatment.  You are taking thyroid replacement medicine and you: ? Sweat a lot. ? Have tremors. ? Feel anxious. ? Lose weight rapidly. ? Cannot tolerate heat. ? Have emotional swings. ? Have diarrhea. ? Feel weak. Get help right away if you have:  Chest pain.  An irregular heartbeat.  A rapid heartbeat.  Difficulty breathing. Summary  Hypothyroidism is when the thyroid gland does not make enough of certain hormones (it is underactive).  When the thyroid is underactive, it produces too little of the hormones thyroxine (T4) and triiodothyronine (T3).  The most common cause is Hashimoto's disease, a disease in which the body's disease-fighting system (immune system) attacks the thyroid gland. The condition can also be caused by viral infections, medicine, pregnancy, or past radiation treatment to the head or neck.  Symptoms may include weight gain, dry skin, constipation, feeling as though you do not have energy, and not being able to tolerate cold.  This condition is treated with medicine to replace the thyroid hormones that your body does not make. This information is not intended to replace advice given to you by your health care provider. Make sure you discuss any questions you have with your health care provider. Document Revised:  10/24/2017 Document Reviewed: 10/22/2017 Elsevier Patient Education  [place].

## 2020-05-01 NOTE — Progress Notes (Signed)
Subjective:    Patient ID: Dana [name], female    DOB: 1956/11/23, 64 y.o.   MRN: [id number]  No chief complaint on file.   HPI Pt is a 64 yo female with pmh sig for HTN, migraines, h/o CVA, hypothyroidism, HLD, h/o primary biliary cholangitis followed by Dr. Regis Bill seen for acute concern.  Pt endorses intermittent lower jaw pain and palpitations s/p neck lift on 04/13/20 at [facility] by Dr. Clint Guy.  Pt contacted the surgeon's office a few wks ago for increased pain.  Was sent in an abx, but only took 3-4 days worth as she was feeling better.  Pt did not go back to [facility] for f/u.  States she called them and sent pictures so she wouldn't have to drive back.  Pt notes 3 episodes of palpitations, intermittent abdominal pain, nausea, increased belching, and fatigue yesterday.  Pt denies fever, ear pain, ear pressure, sore throat, cough.    Pt's sister died of a MI.  Past Medical History:  Diagnosis Date  . Abnormal finding on MRI of brain 01/10/2015  . CVA (cerebral infarction) 2010   diplopia neg acute MRi whit matter changes neg LP   . Elevated LFTs 11/23/2014  . High blood pressure    readings  . High cholesterol   . Hip pain, acute 10/13/2013  . Hx of varicella   . Low bone density 07/13/2018  . Migraine with aura    auras  . Non-cardiac chest pain    hops and evaluation 2011 dr Lia Foyer  . Other and unspecified hyperlipidemia 04/27/2012   Has been on meds for a number of years .  Low dose crestor .  Baseline reported as 280/   . PFO (patent foramen ovale)    Moderate size  . Pneumonia 2007   and flu hospitalized   . Primary biliary cholangitis ([place]) 04/08/2018  . Sjogren's disease ([place])   . Stroke [facility])    on hrt double vision- possible blood clot on brain; Dr. Floyde Parkins at [facility]   . Thyroid disease     Allergies  Allergen Reactions  . Estrogens Other (See Comments)    Had stroke on HRT/OCP  . Prednisone Other (See Comments)    Jittery and irritable hard to sleep with  oral pred  . Codeine     REACTION: nausea  . Ibuprofen     REACTION: nause    ROS General: Denies fever, chills, night sweats, changes in weight, changes in appetite  + fatigue HEENT: Denies headaches, ear pain, changes in vision, rhinorrhea, sore throat  +lower jaw pain CV: Denies CP, SOB, orthopnea  + palpitations Pulm: Denies SOB, cough, wheezing GI: Denies abdominal pain, vomiting, diarrhea, constipation  + nausea, belching GU: Denies dysuria, hematuria, frequency, vaginal discharge Msk: Denies muscle cramps, joint pains Neuro: Denies weakness, numbness, tingling Skin: Denies rashes, bruising Psych: Denies depression, anxiety, hallucinations    Objective:    Blood pressure 124/70, pulse 82, temperature 97.8 F (36.6 C), temperature source Temporal, weight 129 lb (58.5 kg), last menstrual period 04/25/2009, SpO2 97 %.   Gen. Pleasant, well-nourished, in no distress, normal affect   HEENT: [place]/AT, face symmetric, conjunctiva clear, no scleral icterus, PERRLA, EOMI, nares patent without drainage, pharynx without erythema or exudate.  TMs normal bilaterally.  No cervical lymphadenopathy. Neck: No JVD, no thyromegaly Lungs: no accessory muscle use, CTAB, no wheezes or rales Cardiovascular: RRR, no m/r/g, no peripheral edema Musculoskeletal: No deformities, no cyanosis or clubbing, normal tone Neuro:  A&Ox3, CN II-XII intact, normal gait Skin:  Warm, no lesions/ rash.  Healing surgical lesions at posterior ears and posterior hairline without erythema, induration, increased warmth, or drainage.  Wt Readings from Last 3 Encounters:  02/16/20 133 lb (60.3 kg)  01/14/20 136 lb (61.7 kg)  06/21/19 132 lb (59.9 kg)    Lab Results  Component Value Date   WBC 5.0 01/14/2020   HGB 12.7 01/14/2020   HCT 38.3 01/14/2020   PLT 222 01/14/2020   GLUCOSE 79 01/14/2020   CHOL 159 04/29/2019   TRIG 100.0 04/29/2019   HDL 77.10 04/29/2019   LDLCALC 62 04/29/2019   ALT 16 01/14/2020    AST 19 01/14/2020   NA 140 01/14/2020   K 4.2 01/14/2020   CL 105 01/14/2020   CREATININE 0.80 01/14/2020   BUN 11 01/14/2020   CO2 26 01/14/2020   TSH 1.46 04/29/2019   INR 1.0 02/16/2020    Assessment/Plan:  Jaw pain  -Discussed possible causes including infection, cardiac etiology -EKG with NSR.  No T wave changes.  Compared to previous study on 04/22/2017. -Tylenol as needed - Plan: EKG 12-Lead, CBC with Differential/Platelet  Palpitations -Possibly 2/2 infection, medications, thyroid dysfunction -EKG obtained this visit - Plan: EKG 12-Lead, Basic metabolic panel  Hypothyroidism, unspecified type -Continue Synthroid 75 mcg daily - Plan: TSH  Elective surgery -s/p mirco neck lift 04/13/20 at [facility] by Dr. Tomie China  F/u as needed with PCP  Abbe Amsterdam, MD

## 2020-05-24 ENCOUNTER — Other Ambulatory Visit: Payer: Self-pay | Admitting: Internal Medicine

## 2020-06-05 ENCOUNTER — Telehealth: Payer: Self-pay | Admitting: Rheumatology

## 2020-06-05 MED ORDER — PILOCARPINE HCL 5 MG PO TABS: ORAL_TABLET | ORAL | 0 refills | Status: DC

## 2020-06-05 NOTE — Telephone Encounter (Signed)
Last Visit: 01/14/2020 Next Visit: 07/12/2020  Current Dose per office note on 01/14/2020: pilocarpine 5 mg BID for symptomatic relief.  DX: Sjogren's syndrome with keratoconjunctivitis sicca  Okay to refill pilocarpine?

## 2020-06-05 NOTE — Progress Notes (Signed)
Chief Complaint  Patient presents with  . Annual Exam    Doing okay    HPI: Patient  Dana [name]  64 y.o. comes in today for Preventive Health Care visit  And fu labs etc  BP has been  Better at home 70 s  hld on statin meds   Explanation for  Atherosclerosis incidental finding on  Scans  Leg cramps recenetly nocturnal  If mag could be low  please review last labs needs better explanations  Followed by rheum and Gi for  Thyroid  taking med no se  Uncertain memory   Forgetful on focusing   Health Maintenance  Topic Date Due  . COVID-19 Vaccine (1) Never done  . HIV Screening  Never done  . TETANUS/TDAP  Never done  . INFLUENZA VACCINE  06/25/2020  . PNA vac Low Risk Adult (2 of 2 - PPSV23) 09/09/2020  . PAP SMEAR-Modifier  04/08/2021  . MAMMOGRAM  07/15/2021  . COLONOSCOPY  09/29/2023  . DEXA SCAN  Completed  . Hepatitis C Screening  Completed   Health Maintenance Review LIFESTYLE:  Exercise:  Cleaning laundry  Tobacco/ETS: n Alcohol: no Sugar beverages: Sleep:  Enough  Drug use: no HH of   2  No pets  Work: Vit d  Causes breaking out  Cramps  Leg off an on .    ROS:  GEN/ HEENT: No fever, significant weight changes sweats headaches vision problems hearing changes, CV/ PULM; No chest pain shortness of breath cough, syncope,edema  change in exercise tolerance. GI /GU: No adominal pain, vomiting, change in bowel habits. No blood in the stool. No significant GU symptoms. SKIN/HEME: ,no acute skin rashes suspicious lesions or bleeding. No lymphadenopathy, nodules, masses.  NEURO/ PSYCH:  No neurologic signs such as weakness numbness. No depression anxiety. IMM/ Allergy: No unusual infections.  Allergy .   REST of 12 system review negative except as per HPI   Past Medical History:  Diagnosis Date  . Abnormal finding on MRI of brain 01/10/2015  . CVA (cerebral infarction) 2010   diplopia neg acute MRi whit matter changes neg LP   . Elevated LFTs 11/23/2014  .  High blood pressure    readings  . High cholesterol   . Hip pain, acute 10/13/2013  . Hx of varicella   . Low bone density 07/13/2018  . Migraine with aura    auras  . Non-cardiac chest pain    hops and evaluation 2011 dr Riley Kill  . Other and unspecified hyperlipidemia 04/27/2012   Has been on meds for a number of years .  Low dose crestor .  Baseline reported as 280/   . PFO (patent foramen ovale)    Moderate size  . Pneumonia 2007   and flu hospitalized   . Primary biliary cholangitis (HCC) 04/08/2018  . Sjogren's disease (HCC)   . Stroke [facility])    on hrt double vision- possible blood clot on brain; Dr. Lesia Sago at [facility]   . Thyroid disease     Past Surgical History:  Procedure Laterality Date  . BREAST SURGERY     implants  . COLONOSCOPY    . FOOT SURGERY     had bunyon removed  . TONSILLECTOMY    . TOOTH EXTRACTION  11/30/2018  . TUBAL LIGATION      Family History  Adopted: Yes  Problem Relation Age of Onset  . Cancer Mother        pancreatic  .  Alcohol abuse Mother   . Breast cancer Maternal Aunt   . Healthy Daughter     Social History   Socioeconomic History  . Marital status: Married    Spouse name: Not on file  . Number of children: 1  . Years of education: Not on file  . Highest education level: Not on file  Occupational History  . Not on file  Tobacco Use  . Smoking status: Never Smoker  . Smokeless tobacco: Never Used  Vaping Use  . Vaping Use: Never used  Substance and Sexual Activity  . Alcohol use: No  . Drug use: No  . Sexual activity: Yes  Other Topics Concern  . 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    Neg tad   Some caffiene    Seatbelts no ets fa stored    G1P1   Physically active     5 yo grandaughter    Patient is  left handed.      Social Determinants of Health   Financial Resource Strain:   . Difficulty of Paying Living Expenses:   Food Insecurity:   . Worried About Programme researcher, broadcasting/film/video in the Last Year:   . Barista in the Last Year:   Transportation Needs:   . Freight forwarder (Medical):   Marland Kitchen Lack of Transportation (Non-Medical):   Physical Activity:   . Days of Exercise per Week:   . Minutes of Exercise per Session:   Stress:   . Feeling of Stress :   Social Connections:   . Frequency of Communication with Friends and Family:   . Frequency of Social Gatherings with Friends and Family:   . Attends Religious Services:   . Active Member of Clubs or Organizations:   . Attends Banker Meetings:   Marland Kitchen Marital Status:     Outpatient Medications Prior to Visit  Medication Sig Dispense Refill  . aspirin 81 MG tablet Take 81 mg by mouth daily.    Marland Kitchen atorvastatin (LIPITOR) 20 MG tablet Take 1 tablet (20 mg total) by mouth daily. Please schedule physical with labs for further refills. [phone] 30 tablet 0  . cholecalciferol (VITAMIN D) 1000 units tablet Take 1,000 Units by mouth daily.    Marland Kitchen levothyroxine (SYNTHROID) 75 MCG tablet Take 1 tablet (75 mcg total) by mouth daily before breakfast. 30 tablet 0  . pilocarpine (SALAGEN) 5 MG tablet TAKE (1) TABLET THREE TIMES DAILY AS NEEDED. 90 tablet 0  . ursodiol (ACTIGALL) 250 MG tablet TAKE 1 TABLET BY MOUTH 2 TIMES DAILY. 60 tablet 5  . Calcium Carbonate-Vit D-Min (CALCIUM 1200 PO) Take by mouth daily.     No facility-administered medications prior to visit.     EXAM:  BP 134/90   Pulse 62   Temp 98.2 F (36.8 C) (Oral)   Ht  (1.626 m)   Wt 128 lb 6.4 oz (58.2 kg)   LMP 04/25/2009   SpO2 94%   BMI 22.04 kg/m   Body mass index is 22.04 kg/m. Wt Readings from Last 3 Encounters:  06/06/20 128 lb 6.4 oz (58.2 kg)  05/01/20 129 lb (58.5 kg)  02/16/20 133 lb (60.3  kg)    Physical Exam: Vital signs  reviewed SHF:WYOV is a well-developed well-nourished alert cooperative    who appearsr stated age in no acute distress.  HEENT: normocephalic atraumatic , Eyes: PERRL EOM's full, conjunctiva clear, Nares: paten,t no deformity discharge or tenderness., Ears: no deformity EAC's clear TMs with normal landmarks. Mouth:masked NECK: supple without masses, thyromegaly or bruits. CHEST/PULM:  Clear to auscultation and percussion breath sounds equal no wheeze , rales or rhonchi. No chest wall deformities or tenderness. Breast: normal by inspection . No dimpling, discharge, masses, tenderness or discharge . CV: PMI is nondisplaced, S1 S2 no gallops, murmurs, rubs. Peripheral pulses are without delay.No JVD .  ABDOMEN: Bowel sounds normal nontender  No guard or rebound, no hepato splenomegal no CVA tenderness.   Extremtities:  No clubbing cyanosis or edema, no acute joint swelling or redness no focal atrophy NEURO:  Oriented x3, cranial nerves 3-12 appear to be intact, no obvious focal weakness,gait within normal limits no abnormal reflexes or asymmetrical SKIN: No acute rashes normal turgor, color, no bruising or petechiae. PSYCH: Oriented, good eye contact, no obvious depression anxiety, cognition and judgment appear normal. LN: no cervical axillary inguinal adenopathy  Lab Results  Component Value Date   WBC 6.6 06/06/2020   HGB 13.5 06/06/2020   HCT 42.2 06/06/2020   PLT 238 06/06/2020   GLUCOSE 78 06/06/2020   CHOL 160 06/06/2020   TRIG 70 06/06/2020   HDL 76 06/06/2020   LDLCALC 69 06/06/2020   ALT 18 06/06/2020   AST 22 06/06/2020   NA 141 06/06/2020   K 4.7 06/06/2020   CL 108 06/06/2020   CREATININE 0.76 06/06/2020   BUN 9 06/06/2020   CO2 26 06/06/2020   TSH 2.32 05/01/2020   INR 1.0 02/16/2020    BP Readings from Last 3 Encounters:  06/06/20 134/90  05/01/20 124/70  02/16/20 (!) 146/86    Lab results  From prev visit reviewed with patient  And plan  ekg was normal    ASSESSMENT AND PLAN:  Discussed the following assessment and plan:    ICD-10-CM   1. Visit for preventive health examination  Z00.00   2. Medication management  Z79.899 CBC with Differential/Platelet    Lipid panel    Hepatic function panel    Basic metabolic panel    Magnesium    Vitamin B12    Iron, TIBC and Ferritin Panel  3. Hyperlipidemia, unspecified hyperlipidemia type  E78.5 CBC with Differential/Platelet    Lipid panel    Hepatic function panel    Basic metabolic panel    Magnesium    Vitamin B12    Iron, TIBC and Ferritin Panel  4. Hypothyroidism, unspecified type  E03.9 CBC with Differential/Platelet    Lipid panel    Hepatic function panel    Basic metabolic panel    Magnesium    Vitamin B12    Iron, TIBC and Ferritin Panel  5. Primary biliary cholangitis (HCC)  K74.3 CBC with Differential/Platelet    Lipid panel    Hepatic function panel    Basic metabolic panel    Magnesium    Vitamin B12    Iron, TIBC and Ferritin Panel  6. Sjogren's syndrome, with unspecified organ involvement (HCC)  M35.00 CBC with Differential/Platelet    Lipid panel    Hepatic function panel    Basic metabolic panel    Magnesium    Vitamin B12    Iron, TIBC and Ferritin Panel  7. Low  hematocrit  D64.9 CBC with Differential/Platelet    Lipid panel    Hepatic function panel    Basic metabolic panel    Magnesium    Vitamin B12    Iron, TIBC and Ferritin Panel  8. Leg cramp  R25.2    Return for depending on results and now doing  . Declined  Td update today  Last dexa 8 2019  - 2.4 rfn Fu  If memory concerns   Disc  Can refer  To dr Anne Hahnwillis for eval if needed  Non focal exam today  Attempted to answer her  Concerns and ?s today and reviewing each lab from previous visit . Fortunately nothing  Noted  Portending  Serious medical change.    reviewed if  Rapid palpitations cv sx etc we can do or refer for  more evaluation no syncope or  Tachy sx   Patient Care Team: Rafay Dahan, Neta MendsWanda  K, MD as PCP - General (Internal Medicine) Olivia Mackieaavon, Richard, MD as Attending Physician (Obstetrics and Gynecology) York SpanielWillis, Charles K, MD as Consulting Physician (Neurology) Pollyann Savoyeveshwar, Shaili, MD as Consulting Physician (Rheumatology) Iva BoopGessner, Carl E, MD as Consulting Physician (Gastroenterology) Patient Instructions  Try to get vit d in diet  800 - 1000 iu per day .  Focus on one thing at a time and if problems we can get more help and evaluation  For memory status. cholestererol medication  Can reduce the risk of  Heart attack and stroke  So if doing ok then continue  Will check level today   Labs today and will share with  Dr Leone PayorGessner and Dr D   If finding change in exercise tolerance   Chest pain and shortness of breath    Or  Frequent palpitations  Let us know and we can get cardiology  Evaluation .   Pap due next year  Or if needed.  I will  Review  The record and see if other  Assessments interventions advised .     Health Maintenance, Female Adopting a healthy lifestyle and getting preventive care are important in promoting health and wellness. Ask your health care provider about:  The right schedule for you to have regular tests and exams.  Things you can do on your own to prevent diseases and keep yourself healthy. What should I know about diet, weight, and exercise? Eat a healthy diet   Eat a diet that includes plenty of vegetables, fruits, low-fat dairy products, and lean protein.  Do not eat a lot of foods that are high in solid fats, added sugars, or sodium. Maintain a healthy weight Body mass index (BMI) is used to identify weight problems. It estimates body fat based on height and weight. Your health care provider can help determine your BMI and help you achieve or maintain a healthy weight. Get regular exercise Get regular exercise. This is one of the most important things you can do for your health. Most adults should:  Exercise for at least 150 minutes each week.  The exercise should increase your heart rate and make you sweat (moderate-intensity exercise).  Do strengthening exercises at least twice a week. This is in addition to the moderate-intensity exercise.  Spend less time sitting. Even light physical activity can be beneficial. Watch cholesterol and blood lipids Have your blood tested for lipids and cholesterol at 66 years of age, then have this test every 5 years. Have your cholesterol levels checked more often if:  Your lipid or cholesterol levels are high.  You are older than 66 years of age.  You are at high risk for heart disease. What should I know about cancer screening? Depending on your health history and family history, you may need to have cancer screening at various ages. This may include screening for:  Breast cancer.  Cervical cancer.  Colorectal cancer.  Skin cancer.  Lung cancer. What should I know about heart disease, diabetes, and high blood pressure? Blood pressure and heart disease  High blood pressure causes heart disease and increases the risk of stroke. This is more likely to develop in people who have high blood pressure readings, are of African descent, or are overweight.  Have your blood pressure checked: ? Every 3-5 years if you are 39-71 years of age. ? Every year if you are 51 years old or older. Diabetes Have regular diabetes screenings. This checks your fasting blood sugar level. Have the screening done:  Once every three years after age 25 if you are at a normal weight and have a low risk for diabetes.  More often and at a younger age if you are overweight or have a high risk for diabetes. What should I know about preventing infection? Hepatitis B If you have a higher risk for hepatitis B, you should be screened for this virus. Talk with your health care provider to find out if you are at risk for hepatitis B infection. Hepatitis C Testing is recommended for:  Everyone born from [birth year] through  1965.  Anyone with known risk factors for hepatitis C. Sexually transmitted infections (STIs)  Get screened for STIs, including gonorrhea and chlamydia, if: ? You are sexually active and are younger than 66 years of age. ? You are older than 66 years of age and your health care provider tells you that you are at risk for this type of infection. ? Your sexual activity has changed since you were last screened, and you are at increased risk for chlamydia or gonorrhea. Ask your health care provider if you are at risk.  Ask your health care provider about whether you are at high risk for HIV. Your health care provider may recommend a prescription medicine to help prevent HIV infection. If you choose to take medicine to prevent HIV, you should first get tested for HIV. You should then be tested every 3 months for as long as you are taking the medicine. Pregnancy  If you are about to stop having your period (premenopausal) and you may become pregnant, seek counseling before you get pregnant.  Take 400 to 800 micrograms (mcg) of folic acid every day if you become pregnant.  Ask for birth control (contraception) if you want to prevent pregnancy. Osteoporosis and menopause Osteoporosis is a disease in which the bones lose minerals and strength with aging. This can result in bone fractures. If you are 33 years old or older, or if you are at risk for osteoporosis and fractures, ask your health care provider if you should:  Be screened for bone loss.  Take a calcium or vitamin D supplement to lower your risk of fractures.  Be given hormone replacement therapy (HRT) to treat symptoms of menopause. Follow these instructions at home: Lifestyle  Do not use any products that contain nicotine or tobacco, such as cigarettes, e-cigarettes, and chewing tobacco. If you need help quitting, ask your health care provider.  Do not use street drugs.  Do not share needles.  Ask your health care provider for  help if you need support  or information about quitting drugs. Alcohol use  Do not drink alcohol if: ? Your health care provider tells you not to drink. ? You are pregnant, may be pregnant, or are planning to become pregnant.  If you drink alcohol: ? Limit how much you use to 0-1 drink a day. ? Limit intake if you are breastfeeding.  Be aware of how much alcohol is in your drink. In the [place] one drink equals one 12 oz bottle of beer (355 mL), one 5 oz glass of wine (148 mL), or one 1 oz glass of hard liquor (44 mL). General instructions  Schedule regular health, dental, and eye exams.  Stay current with your vaccines.  Tell your health care provider if: ? You often feel depressed. ? You have ever been abused or do not feel safe at home. Summary  Adopting a healthy lifestyle and getting preventive care are important in promoting health and wellness.  Follow your health care provider's instructions about healthy diet, exercising, and getting tested or screened for diseases.  Follow your health care provider's instructions on monitoring your cholesterol and blood pressure. This information is not intended to replace advice given to you by your health care provider. Make sure you discuss any questions you have with your health care provider. Document Revised: 11/04/2018 Document Reviewed: 11/04/2018 Elsevier Patient Education  2020 [employer].        [place] K. Bridget Westbrooks M.D.

## 2020-06-05 NOTE — Telephone Encounter (Signed)
Patient request refill on Pilocarpine 5 mg sent to [facility].

## 2020-06-06 ENCOUNTER — Encounter: Payer: Self-pay | Admitting: Internal Medicine

## 2020-06-06 ENCOUNTER — Other Ambulatory Visit: Payer: Self-pay

## 2020-06-06 ENCOUNTER — Ambulatory Visit (INDEPENDENT_AMBULATORY_CARE_PROVIDER_SITE_OTHER): Payer: BC Managed Care – PPO | Admitting: Internal Medicine

## 2020-06-06 VITALS — BP 134/90 | HR 62 | Temp 98.2°F | Ht 64.0 in | Wt 128.4 lb

## 2020-06-06 DIAGNOSIS — M3500 Sicca syndrome, unspecified: Secondary | ICD-10-CM

## 2020-06-06 DIAGNOSIS — Z0000 Encounter for general adult medical examination without abnormal findings: Secondary | ICD-10-CM | POA: Diagnosis not present

## 2020-06-06 DIAGNOSIS — E785 Hyperlipidemia, unspecified: Secondary | ICD-10-CM | POA: Diagnosis not present

## 2020-06-06 DIAGNOSIS — R252 Cramp and spasm: Secondary | ICD-10-CM

## 2020-06-06 DIAGNOSIS — K743 Primary biliary cirrhosis: Secondary | ICD-10-CM

## 2020-06-06 DIAGNOSIS — Z79899 Other long term (current) drug therapy: Secondary | ICD-10-CM

## 2020-06-06 DIAGNOSIS — E039 Hypothyroidism, unspecified: Secondary | ICD-10-CM | POA: Diagnosis not present

## 2020-06-06 DIAGNOSIS — D649 Anemia, unspecified: Secondary | ICD-10-CM

## 2020-06-06 NOTE — Patient Instructions (Addendum)
Try to get vit d in diet  800 - 1000 iu per day .  Focus on one thing at a time and if problems we can get more help and evaluation  For memory status. cholestererol medication  Can reduce the risk of  Heart attack and stroke  So if doing ok then continue  Will check level today   Labs today and will share with  Dr Leone Payor and Dr D   If finding change in exercise tolerance   Chest pain and shortness of breath    Or  Frequent palpitations  Let us know and we can get cardiology  Evaluation .   Pap due next year  Or if needed.  I will  Review  The record and see if other  Assessments interventions advised .     Health Maintenance, Female Adopting a healthy lifestyle and getting preventive care are important in promoting health and wellness. Ask your health care provider about:  The right schedule for you to have regular tests and exams.  Things you can do on your own to prevent diseases and keep yourself healthy. What should I know about diet, weight, and exercise? Eat a healthy diet   Eat a diet that includes plenty of vegetables, fruits, low-fat dairy products, and lean protein.  Do not eat a lot of foods that are high in solid fats, added sugars, or sodium. Maintain a healthy weight Body mass index (BMI) is used to identify weight problems. It estimates body fat based on height and weight. Your health care provider can help determine your BMI and help you achieve or maintain a healthy weight. Get regular exercise Get regular exercise. This is one of the most important things you can do for your health. Most adults should:  Exercise for at least 150 minutes each week. The exercise should increase your heart rate and make you sweat (moderate-intensity exercise).  Do strengthening exercises at least twice a week. This is in addition to the moderate-intensity exercise.  Spend less time sitting. Even light physical activity can be beneficial. Watch cholesterol and blood lipids Have  your blood tested for lipids and cholesterol at 66 years of age, then have this test every 5 years. Have your cholesterol levels checked more often if:  Your lipid or cholesterol levels are high.  You are older than 66 years of age.  You are at high risk for heart disease. What should I know about cancer screening? Depending on your health history and family history, you may need to have cancer screening at various ages. This may include screening for:  Breast cancer.  Cervical cancer.  Colorectal cancer.  Skin cancer.  Lung cancer. What should I know about heart disease, diabetes, and high blood pressure? Blood pressure and heart disease  High blood pressure causes heart disease and increases the risk of stroke. This is more likely to develop in people who have high blood pressure readings, are of African descent, or are overweight.  Have your blood pressure checked: ? Every 3-5 years if you are 81-63 years of age. ? Every year if you are 56 years old or older. Diabetes Have regular diabetes screenings. This checks your fasting blood sugar level. Have the screening done:  Once every three years after age 13 if you are at a normal weight and have a low risk for diabetes.  More often and at a younger age if you are overweight or have a high risk for diabetes. What should I  know about preventing infection? Hepatitis B If you have a higher risk for hepatitis B, you should be screened for this virus. Talk with your health care provider to find out if you are at risk for hepatitis B infection. Hepatitis C Testing is recommended for:  Everyone born from [birth year] through 1967.  Anyone with known risk factors for hepatitis C. Sexually transmitted infections (STIs)  Get screened for STIs, including gonorrhea and chlamydia, if: ? You are sexually active and are younger than 66 years of age. ? You are older than 66 years of age and your health care provider tells you that you are at  risk for this type of infection. ? Your sexual activity has changed since you were last screened, and you are at increased risk for chlamydia or gonorrhea. Ask your health care provider if you are at risk.  Ask your health care provider about whether you are at high risk for HIV. Your health care provider may recommend a prescription medicine to help prevent HIV infection. If you choose to take medicine to prevent HIV, you should first get tested for HIV. You should then be tested every 3 months for as long as you are taking the medicine. Pregnancy  If you are about to stop having your period (premenopausal) and you may become pregnant, seek counseling before you get pregnant.  Take 400 to 800 micrograms (mcg) of folic acid every day if you become pregnant.  Ask for birth control (contraception) if you want to prevent pregnancy. Osteoporosis and menopause Osteoporosis is a disease in which the bones lose minerals and strength with aging. This can result in bone fractures. If you are 88 years old or older, or if you are at risk for osteoporosis and fractures, ask your health care provider if you should:  Be screened for bone loss.  Take a calcium or vitamin D supplement to lower your risk of fractures.  Be given hormone replacement therapy (HRT) to treat symptoms of menopause. Follow these instructions at home: Lifestyle  Do not use any products that contain nicotine or tobacco, such as cigarettes, e-cigarettes, and chewing tobacco. If you need help quitting, ask your health care provider.  Do not use street drugs.  Do not share needles.  Ask your health care provider for help if you need support or information about quitting drugs. Alcohol use  Do not drink alcohol if: ? Your health care provider tells you not to drink. ? You are pregnant, may be pregnant, or are planning to become pregnant.  If you drink alcohol: ? Limit how much you use to 0-1 drink a day. ? Limit intake if you  are breastfeeding.  Be aware of how much alcohol is in your drink. In the [place] one drink equals one 12 oz bottle of beer (355 mL), one 5 oz glass of wine (148 mL), or one 1 oz glass of hard liquor (44 mL). General instructions  Schedule regular health, dental, and eye exams.  Stay current with your vaccines.  Tell your health care provider if: ? You often feel depressed. ? You have ever been abused or do not feel safe at home. Summary  Adopting a healthy lifestyle and getting preventive care are important in promoting health and wellness.  Follow your health care provider's instructions about healthy diet, exercising, and getting tested or screened for diseases.  Follow your health care provider's instructions on monitoring your cholesterol and blood pressure. This information is not intended to replace advice given  to you by your health care provider. Make sure you discuss any questions you have with your health care provider. Document Revised: 11/04/2018 Document Reviewed: 11/04/2018 Elsevier Patient Education  2020 [employer].

## 2020-06-07 LAB — BASIC METABOLIC PANEL
BUN: 9 mg/dL (ref 7–25)
CO2: 26 mmol/L (ref 20–32)
Calcium: 9.8 mg/dL (ref 8.6–10.4)
Chloride: 108 mmol/L (ref 98–110)
Creat: 0.76 mg/dL (ref 0.50–0.99)
Glucose, Bld: 78 mg/dL (ref 65–99)
Potassium: 4.7 mmol/L (ref 3.5–5.3)
Sodium: 141 mmol/L (ref 135–146)

## 2020-06-07 LAB — LIPID PANEL
Cholesterol: 160 mg/dL (ref <200)
HDL: 76 mg/dL (ref >=50)
LDL Cholesterol (Calc): 69 mg/dL (calc)
Non-HDL Cholesterol (Calc): 84 mg/dL (calc) (ref <130)
Total CHOL/HDL Ratio: 2.1 (calc) (ref <5.0)
Triglycerides: 70 mg/dL (ref <150)

## 2020-06-07 LAB — HEPATIC FUNCTION PANEL
AG Ratio: 1.6 (calc) (ref 1.0–2.5)
ALT: 18 U/L (ref 6–29)
AST: 22 U/L (ref 10–35)
Albumin: 4.2 g/dL (ref 3.6–5.1)
Alkaline phosphatase (APISO): 123 U/L (ref 37–153)
Bilirubin, Direct: 0.1 mg/dL (ref 0.0–0.2)
Globulin: 2.6 g/dL (calc) (ref 1.9–3.7)
Indirect Bilirubin: 0.4 mg/dL (calc) (ref 0.2–1.2)
Total Bilirubin: 0.5 mg/dL (ref 0.2–1.2)
Total Protein: 6.8 g/dL (ref 6.1–8.1)

## 2020-06-07 LAB — CBC WITH DIFFERENTIAL/PLATELET
Absolute Monocytes: 508 cells/uL (ref 200–950)
Basophils Absolute: 59 cells/uL (ref 0–200)
Basophils Relative: 0.9 %
Eosinophils Absolute: 99 cells/uL (ref 15–500)
Eosinophils Relative: 1.5 %
HCT: 42.2 % (ref 35.0–45.0)
Hemoglobin: 13.5 g/dL (ref 11.7–15.5)
Lymphs Abs: 832 cells/uL — ABNORMAL LOW (ref 850–3900)
MCH: 29.7 pg (ref 27.0–33.0)
MCHC: 32 g/dL (ref 32.0–36.0)
MCV: 93 fL (ref 80.0–100.0)
MPV: 12 fL (ref 7.5–12.5)
Monocytes Relative: 7.7 %
Neutro Abs: 5102 cells/uL (ref 1500–7800)
Neutrophils Relative %: 77.3 %
Platelets: 238 10*3/uL (ref 140–400)
RBC: 4.54 10*6/uL (ref 3.80–5.10)
RDW: 14 % (ref 11.0–15.0)
Total Lymphocyte: 12.6 %
WBC: 6.6 10*3/uL (ref 3.8–10.8)

## 2020-06-07 LAB — IRON,TIBC AND FERRITIN PANEL
%SAT: 17 % (calc) (ref 16–45)
Ferritin: 56 ng/mL (ref 16–288)
Iron: 60 ug/dL (ref 45–160)
TIBC: 346 mcg/dL (calc) (ref 250–450)

## 2020-06-07 LAB — MAGNESIUM: Magnesium: 2.1 mg/dL (ref 1.5–2.5)

## 2020-06-07 LAB — VITAMIN B12: Vitamin B-12: 345 pg/mL (ref 200–1100)

## 2020-06-08 NOTE — Progress Notes (Signed)
Good news   no anemia ( the one flag for de lymphs is not important to your health , cholesterol is good , iron level , magnesium kidney function and potassium are all normal range .  Vit b12 is  normal  low normal ( dont think low enough to cause a problem but you could take a vit b complex supplement vitamin in case ) Forwarding these labs to dr Leone Payor and Dr D

## 2020-06-30 NOTE — Progress Notes (Deleted)
Office Visit Note  Patient: Dana [name]             Date of Birth: 25-Oct-1956           MRN: [id number]             PCP: Burnis Medin, MD Referring: Burnis Medin, MD Visit Date: 07/12/2020 Occupation: _0 @  Subjective:  No chief complaint on file.   History of Present Illness: Dana [name] is a 64 y.o. female ***   Activities of Daily Living:  Patient reports morning stiffness for *** {minute/hour:19697}.   Patient {ACTIONS;DENIES/REPORTS:21021675::"Denies"} nocturnal pain.  Difficulty dressing/grooming: {ACTIONS;DENIES/REPORTS:21021675::"Denies"} Difficulty climbing stairs: {ACTIONS;DENIES/REPORTS:21021675::"Denies"} Difficulty getting out of chair: {ACTIONS;DENIES/REPORTS:21021675::"Denies"} Difficulty using hands for taps, buttons, cutlery, and/or writing: {ACTIONS;DENIES/REPORTS:21021675::"Denies"}  No Rheumatology ROS completed.   PMFS History:  Patient Active Problem List   Diagnosis Date Noted  . Elevated blood pressure reading without diagnosis of hypertension 02/16/2020  . Onychomycosis 08/05/2018  . Sjogren's disease ([place]) 07/13/2018  . Low bone density 07/13/2018  . Vitamin D deficiency 07/06/2018  . Primary biliary cholangitis ([place]) 04/08/2018  . Tetanus, diphtheria, and acellular pertussis (Tdap) vaccination declined 04/08/2018  . Trigger point of right shoulder region 03/24/2018  . Slipped rib syndrome 03/19/2018  . Nonallopathic lesion of rib cage 03/19/2018  . Nonallopathic lesion of thoracic region 03/19/2018  . Nonallopathic lesion of cervical region 03/19/2018  . Low serum vitamin D 04/09/2016  . PFO (patent foramen ovale)   . Abnormal finding on MRI of brain 01/10/2015  . Visit for preventive health examination 11/24/2014  . Bilateral arm weakness  hx of  11/23/2014  . Essential hypertension 11/23/2014  . Left paraspinal back pain 10/13/2013  . Chronic constipation 09/16/2013  . ESR raised 08/23/2013  . Menopausal hot flushes  04/27/2012  . Other and unspecified hyperlipidemia 04/27/2012  . Migraine with aura   . Hyperlipidemia 01/03/2012  . Hypothyroidism 03/21/2010  . HYPERTENSION, UNSPECIFIED 03/21/2010  . CEREBROVASCULAR ACCIDENT, HX OF 03/21/2010    Past Medical History:  Diagnosis Date  . Abnormal finding on MRI of brain 01/10/2015  . CVA (cerebral infarction) 2010   diplopia neg acute MRi whit matter changes neg LP   . Elevated LFTs 11/23/2014  . High blood pressure    readings  . High cholesterol   . Hip pain, acute 10/13/2013  . Hx of varicella   . Low bone density 07/13/2018  . Migraine with aura    auras  . Non-cardiac chest pain    hops and evaluation 2011 dr Lia Foyer  . Other and unspecified hyperlipidemia 04/27/2012   Has been on meds for a number of years .  Low dose crestor .  Baseline reported as 280/   . PFO (patent foramen ovale)    Moderate size  . Pneumonia 2007   and flu hospitalized   . Primary biliary cholangitis ([place]) 04/08/2018  . Sjogren's disease ([place])   . Stroke [facility])    on hrt double vision- possible blood clot on brain; Dr. Floyde Parkins at [facility]   . Thyroid disease     Family History  Adopted: Yes  Problem Relation Age of Onset  . Cancer Mother        pancreatic  . Alcohol abuse Mother   . Breast cancer Maternal Aunt   . Healthy Daughter    Past Surgical History:  Procedure Laterality Date  . BREAST SURGERY     implants  . COLONOSCOPY    .  FOOT SURGERY     had bunyon removed  . TONSILLECTOMY    . TOOTH EXTRACTION  11/30/2018  . 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    Neg tad   Some caffiene    Seatbelts no ets fa stored    G1P1   Physically active     73 yo grandaughter    Patient is left handed.      Immunization  History  Administered Date(s) Administered  . Fluad Quad(high Dose 65+) 09/10/2019  . Influenza Split 09/08/2012  . Influenza,inj,Quad PF,6+ Mos 08/23/2013, 11/16/2014, 09/07/2015, 08/23/2016, 09/02/2017, 09/08/2018  . Pneumococcal Conjugate-13 04/26/2011, 09/10/2019     Objective: Vital Signs: LMP 04/25/2009    Physical Exam   Musculoskeletal Exam: ***  CDAI Exam: CDAI Score: -- Patient Global: --; Provider Global: -- Swollen: --; Tender: -- Joint Exam 07/12/2020   No joint exam has been documented for this visit   There is currently no information documented on the homunculus. Go to the Rheumatology activity and complete the homunculus joint exam.  Investigation: No additional findings.  Imaging: No results found.  Recent Labs: Lab Results  Component Value Date   WBC 6.6 06/06/2020   HGB 13.5 06/06/2020   PLT 238 06/06/2020   NA 141 06/06/2020   K 4.7 06/06/2020   CL 108 06/06/2020   CO2 26 06/06/2020   GLUCOSE 78 06/06/2020   BUN 9 06/06/2020   CREATININE 0.76 06/06/2020   BILITOT 0.5 06/06/2020   ALKPHOS 116 11/12/2019   AST 22 06/06/2020   ALT 18 06/06/2020   PROT 6.8 06/06/2020   ALBUMIN 4.2 11/12/2019   CALCIUM 9.8 06/06/2020   GFRAA 90 01/14/2020    Speciality Comments: PLQ Eye Exam: 09/25/18 WNL @ [facility] follow up 1 year  Procedures:  No procedures performed Allergies: Estrogens, Prednisone, Codeine, Ibuprofen, and Sulfa antibiotics   Assessment / Plan:     Visit Diagnoses: No diagnosis found.  Orders: No orders of the defined types were placed in this encounter.  No orders of the defined types were placed in this encounter.   Face-to-face time spent with patient was *** minutes. Greater than 50% of time was spent in counseling and coordination of care.  Follow-Up Instructions: No follow-ups on file.   Earnestine Mealing, CMA  Note - This record has been created using Editor, commissioning.  Chart creation errors have been  sought, but may not always  have been located. Such creation errors do not reflect on  the standard of medical care.

## 2020-07-05 ENCOUNTER — Other Ambulatory Visit: Payer: Self-pay | Admitting: Physician Assistant

## 2020-07-05 NOTE — Telephone Encounter (Signed)
Last Visit:01/14/2020 Next Visit:07/12/2020  Okay to refill per Dr. Deveshwar 

## 2020-07-08 ENCOUNTER — Other Ambulatory Visit: Payer: Self-pay | Admitting: Internal Medicine

## 2020-07-12 ENCOUNTER — Ambulatory Visit: Payer: Self-pay | Admitting: Physician Assistant

## 2020-08-02 ENCOUNTER — Other Ambulatory Visit: Payer: Self-pay | Admitting: Internal Medicine

## 2020-08-02 ENCOUNTER — Other Ambulatory Visit: Payer: Self-pay | Admitting: Rheumatology

## 2020-08-02 NOTE — Telephone Encounter (Signed)
Last Visit:01/14/2020 Next Visit: 07/12/2020  Okay to refill per Dr. Corliss Skains

## 2020-08-18 ENCOUNTER — Ambulatory Visit: Payer: Self-pay | Admitting: Physician Assistant

## 2020-08-18 LAB — HM MAMMOGRAPHY

## 2020-09-01 ENCOUNTER — Other Ambulatory Visit: Payer: Self-pay | Admitting: Rheumatology

## 2020-09-01 NOTE — Telephone Encounter (Signed)
Last Visit:01/14/2020 Next Visit:01/11/2021  Okay to refill per Dr. Corliss Skains

## 2020-10-02 ENCOUNTER — Other Ambulatory Visit: Payer: Self-pay | Admitting: Rheumatology

## 2020-10-02 ENCOUNTER — Other Ambulatory Visit: Payer: Self-pay | Admitting: Internal Medicine

## 2020-10-02 NOTE — Telephone Encounter (Signed)
Last Visit:01/14/2020 Next Visit:07/12/2020  Okay to refill per Dr. Corliss Skains

## 2020-10-13 ENCOUNTER — Other Ambulatory Visit: Payer: Self-pay | Admitting: Internal Medicine

## 2020-11-13 ENCOUNTER — Ambulatory Visit: Payer: BC Managed Care – PPO

## 2020-11-13 ENCOUNTER — Other Ambulatory Visit: Payer: Self-pay | Admitting: Internal Medicine

## 2020-11-14 ENCOUNTER — Ambulatory Visit (INDEPENDENT_AMBULATORY_CARE_PROVIDER_SITE_OTHER): Payer: BC Managed Care – PPO | Admitting: Internal Medicine

## 2020-11-14 ENCOUNTER — Other Ambulatory Visit: Payer: Self-pay

## 2020-11-14 ENCOUNTER — Encounter: Payer: Self-pay | Admitting: Internal Medicine

## 2020-11-14 DIAGNOSIS — Z23 Encounter for immunization: Secondary | ICD-10-CM | POA: Diagnosis not present

## 2020-11-14 NOTE — Progress Notes (Signed)
Per orders of Dr Regis Bill, injection of Influenza given by Armandina Gemma, cma.  Patient tolerated injection well.

## 2020-12-07 ENCOUNTER — Other Ambulatory Visit: Payer: Self-pay | Admitting: Internal Medicine

## 2020-12-14 ENCOUNTER — Telehealth: Payer: Self-pay | Admitting: *Deleted

## 2020-12-14 NOTE — Telephone Encounter (Signed)
[facility]  Message sent to PCP.

## 2020-12-18 NOTE — Telephone Encounter (Signed)
I called and spoke with the patient and she stated she does not use [facility] anymore and she is not on this medication. Patient stated [facility] will [facility] and tell them to stop.  Terryn Rosenkranz,cma

## 2020-12-18 NOTE — Telephone Encounter (Signed)
I do not see tizanidine on her med list current or past Contact patient and ask if she has been taking this medicine and different providers prescribing it.

## 2020-12-28 NOTE — Progress Notes (Signed)
Office Visit Note  Patient: Dana [name]             Date of Birth: 1956/01/15           MRN: [id number]             PCP: Burnis Medin, MD Referring: Burnis Medin, MD Visit Date: 01/11/2021 Occupation: _0 @  Subjective:  Dry eyes   History of Present Illness: Dana [name] is a 65 y.o. female with history of Sjogren's syndrome.  Patient continues to have chronic sicca symptoms.  She states that she has been experiencing significant eye dryness at night due to sleeping with her eyes open.  She cannot tolerate wearing an eye mask and does not want to use a humidifier in her home.  She has been taking pilocarpine 5 mg twice daily and using an over-the-counter eyedrops for symptomatic relief.  She has had some increased blurry vision so she plans to make an appointment with her ophthalmologist.  She states that she also has an upcoming appointment with her dentist.  She denies any swollen lymph nodes.  She has not had any parotid swelling or tenderness.  She states that she was diagnosed with rosacea and has been trying several different topical agents but has not noticed much improvement in her symptoms.  She denies any other rashes.  She has not had any joint pain or joint swelling.  She says she has occasional discomfort on the lateral aspect of her right hip when lying on her side at night.   She is due to update DEXA. She continues to take vitamin D 2,000 units daily.  Patient reports that she recently had an elevation in alk phos and was evaluated by Dr. Arelia Longest on 01/04/2021.  She has upcoming lab work for further evaluation    Activities of Daily Living:  Patient reports morning stiffness for 0 minutes.   Patient Denies nocturnal pain.  Difficulty dressing/grooming: Denies Difficulty climbing stairs: Denies Difficulty getting out of chair: Denies Difficulty using hands for taps, buttons, cutlery, and/or writing: Denies  Review of Systems  Constitutional: Negative for  fatigue.  HENT: Positive for mouth dryness. Negative for mouth sores and nose dryness.   Eyes: Positive for dryness. Negative for pain and itching.  Respiratory: Negative for shortness of breath and difficulty breathing.   Cardiovascular: Negative for chest pain and palpitations.  Gastrointestinal: Negative for blood in stool, constipation and diarrhea.  Endocrine: Negative for increased urination.  Genitourinary: Negative for difficulty urinating.  Musculoskeletal: Negative for arthralgias, joint pain, joint swelling, myalgias, morning stiffness, muscle tenderness and myalgias.  Skin: Negative for color change, rash and redness.  Allergic/Immunologic: Negative for susceptible to infections.  Neurological: Negative for dizziness, numbness, headaches, memory loss and weakness.  Hematological: Positive for bruising/bleeding tendency.  Psychiatric/Behavioral: Negative for confusion and sleep disturbance.    PMFS History:  Patient Active Problem List   Diagnosis Date Noted  . Elevated blood pressure reading without diagnosis of hypertension 02/16/2020  . Onychomycosis 08/05/2018  . Sjogren's disease ([place]) 07/13/2018  . Low bone density 07/13/2018  . Vitamin D deficiency 07/06/2018  . Primary biliary cholangitis ([place]) 04/08/2018  . Tetanus, diphtheria, and acellular pertussis (Tdap) vaccination declined 04/08/2018  . Trigger point of right shoulder region 03/24/2018  . Slipped rib syndrome 03/19/2018  . Nonallopathic lesion of rib cage 03/19/2018  . Nonallopathic lesion of thoracic region 03/19/2018  . Nonallopathic lesion of cervical region 03/19/2018  . Low serum vitamin D  04/09/2016  . PFO (patent foramen ovale)   . Abnormal finding on MRI of brain 01/10/2015  . Visit for preventive health examination 11/24/2014  . Bilateral arm weakness  hx of  11/23/2014  . Essential hypertension 11/23/2014  . Left paraspinal back pain 10/13/2013  . Chronic constipation 09/16/2013  . ESR raised  08/23/2013  . Menopausal hot flushes 04/27/2012  . Other and unspecified hyperlipidemia 04/27/2012  . Migraine with aura   . Hyperlipidemia 01/03/2012  . Hypothyroidism 03/21/2010  . HYPERTENSION, UNSPECIFIED 03/21/2010  . CEREBROVASCULAR ACCIDENT, HX OF 03/21/2010    Past Medical History:  Diagnosis Date  . Abnormal finding on MRI of brain 01/10/2015  . CVA (cerebral infarction) 2010   diplopia neg acute MRi whit matter changes neg LP   . Elevated LFTs 11/23/2014  . High blood pressure    readings  . High cholesterol   . Hip pain, acute 10/13/2013  . Hx of varicella   . Low bone density 07/13/2018  . Migraine with aura    auras  . Non-cardiac chest pain    hops and evaluation 2011 dr Lia Foyer  . Other and unspecified hyperlipidemia 04/27/2012   Has been on meds for a number of years .  Low dose crestor .  Baseline reported as 280/   . PFO (patent foramen ovale)    Moderate size  . Pneumonia 2007   and flu hospitalized   . Primary biliary cholangitis ([place]) 04/08/2018  . Sjogren's disease ([place])   . Stroke [facility])    on hrt double vision- possible blood clot on brain; Dr. Floyde Parkins at [facility]   . Thyroid disease     Family History  Adopted: Yes  Problem Relation Age of Onset  . Alcohol abuse Mother   . Pancreatic cancer Mother   . Liver disease Mother   . Breast cancer Maternal Aunt   . Healthy Daughter   . Stomach cancer Neg Hx   . Esophageal cancer Neg Hx    Past Surgical History:  Procedure Laterality Date  . BREAST SURGERY     implants  . COLONOSCOPY    . FOOT SURGERY     had bunyon removed  . TONSILLECTOMY    . TOOTH EXTRACTION  11/30/2018  . 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    Neg tad   Some caffiene    Seatbelts no  ets fa stored    G1P1   Physically active     21 yo grandaughter    Patient is left handed.      Immunization History  Administered Date(s) Administered  . Fluad Quad(high Dose 65+) 09/10/2019, 11/14/2020  . Influenza Split 09/08/2012  . Influenza,inj,Quad PF,6+ Mos 08/23/2013, 11/16/2014, 09/07/2015, 08/23/2016, 09/02/2017, 09/08/2018  . Moderna Sars-Covid-2 Vaccination 07/17/2020, 08/14/2020  . Pneumococcal Conjugate-13 04/26/2011, 09/10/2019     Objective: Vital Signs: BP (!) 160/99 (BP Location: Left Wrist, Patient Position: Sitting, Cuff Size: Normal)   Pulse 62   Resp 13   Ht _0  (1.626 m)   Wt 125 lb 3.2 oz (56.8 kg)   LMP 04/25/2009   BMI 21.49 kg/m    Physical Exam  Vitals and nursing note reviewed.  Constitutional:      Appearance: She is well-developed and well-nourished.  HENT:     Head: Normocephalic and atraumatic.     Mouth/Throat:     Comments: No parotid swelling or tenderness noted Eyes:     Extraocular Movements: EOM normal.     Conjunctiva/sclera: Conjunctivae normal.  Cardiovascular:     Pulses: Intact distal pulses.  Pulmonary:     Effort: Pulmonary effort is normal.  Abdominal:     Palpations: Abdomen is soft.  Musculoskeletal:     Cervical back: Normal range of motion.  Lymphadenopathy:     Cervical: No cervical adenopathy.  Skin:    General: Skin is warm and dry.     Capillary Refill: Capillary refill takes less than 2 seconds.     Comments: Mild rosacea on bilateral cheeks noted  Neurological:     Mental Status: She is alert and oriented to person, place, and time.  Psychiatric:        Mood and Affect: Mood and affect normal.        Behavior: Behavior normal.      Musculoskeletal Exam: C-spine, thoracic spine, lumbar spine have good range of motion with no discomfort.  Shoulder joints, with joints, wrist joints, MCPs, PIPs, DIPs have good range of motion with no synovitis.  She is able to make a complete fist bilaterally.  Hip  joints have good range of motion with no discomfort.  She has some tenderness palpation over the right trochanteric bursa.  Knee joints have good range of motion with no warmth or effusion.  Ankle joints have good range of motion.  CDAI Exam: CDAI Score: -- Patient Global: --; Provider Global: -- Swollen: --; Tender: -- Joint Exam 01/11/2021   No joint exam has been documented for this visit   There is currently no information documented on the homunculus. Go to the Rheumatology activity and complete the homunculus joint exam.  Investigation: No additional findings.  Imaging: No results found.  Recent Labs: Lab Results  Component Value Date   WBC 6.6 06/06/2020   HGB 13.5 06/06/2020   PLT 238 06/06/2020   NA 141 06/06/2020   K 4.7 06/06/2020   CL 108 06/06/2020   CO2 26 06/06/2020   GLUCOSE 78 06/06/2020   BUN 9 06/06/2020   CREATININE 0.76 06/06/2020   BILITOT 0.5 01/04/2021   ALKPHOS 130 (H) 01/04/2021   AST 20 01/04/2021   ALT 15 01/04/2021   PROT 7.8 01/04/2021   ALBUMIN 4.3 01/04/2021   CALCIUM 9.8 06/06/2020   GFRAA 90 01/14/2020    Speciality Comments: PLQ Eye Exam: 09/25/18 WNL @ [facility] follow up 1 year  Procedures:  No procedures performed Allergies: Estrogens, Codeine, Ibuprofen, and Sulfa antibiotics   Assessment / Plan:     Visit Diagnoses: Sjogren's syndrome with keratoconjunctivitis sicca (HCC) - ANA+, Ro+: She continues to have chronic sicca symptoms.  She has been experiencing increased eye dryness at night which she attributes to sleeping with her eyes open.  She has been using over-the-counter eyedrops on a daily basis but has not noticed much improvement in her symptoms.  We discussed the use of an eye mask but she could not tolerate it in the past.  We also discussed the use of a humidifier in her bedroom but she declined.  She was encouraged to schedule appointment with her ophthalmologist for further evaluation since she has been  experiencing increased eye dryness and blurry  vision at times.  She has an upcoming appointment with her dentist.  She has not had any recent dental caries.  She has no parotid swelling or tenderness on exam.  No cervical lymphadenopathy was palpable.  She has been taking pilocarpine 5 mg twice daily for symptomatic relief.  We discussed increasing the dose of pilocarpine to 5 mg 3 times daily to try to alleviate some of her nocturnal symptoms.  She has no signs of inflammatory arthritis at this time.  She has not had any increased joint pain, joint swelling, or morning stiffness.  She has no synovitis on exam.  She does not require immunosuppressive therapy at this time.  Lab work from 01/14/2020 was reviewed today in the office.  We will repeat the following lab work today.  She was advised to notify us if she develops any new or worsening symptoms.  She will follow-up in the office in 5 months.   - Plan: Serum protein electrophoresis with reflex, Rheumatoid factor, COMPLETE METABOLIC PANEL WITH GFR, CBC with Differential/Platelet, Urinalysis, Routine w reflex microscopic, ANA, Sjogrens syndrome-A extractable nuclear antibody, Sjogrens syndrome-B extractable nuclear antibody, Sedimentation rate  High risk medication use - She was started on PLQ in October 2019 but discontinued after 2 weeks due to developing tinnitus and constipation.  She does not require immunosuppressive therapy at this time.- Plan: COMPLETE METABOLIC PANEL WITH GFR, CBC with Differential/Platelet  Positive ANA (antinuclear antibody) - August 11, 2018 AVISE index 0.2, ANA 1: 5120 homogeneous, SSA positive, dsDNA negative, Smith negative, RNP negative, SSB Negative, SCL 70 is negative: She continues to have chronic sicca symptoms as discussed above.  She has no other clinical features of systemic lupus.  We will obtain the following lab work today.  She was advised to notify us if she develops any new or worsening symptoms.- Plan: ANA,  Sedimentation rate, C3 and C4, Anti-DNA antibody, double-stranded  Primary biliary cholangitis ([place]) - She is followed by Dr. Carlean Purl.  Her most recent office visit was on 01/04/2021 at which time Dr. Carlean Purl recommended continuing current dose of ursodeoxycholic acid.  She will continue to follow-up with Dr. Carlean Purl on a yearly basis.  She will be having updated lab work soon due to elevated alk phos.   Osteopenia of both hips: DEXA on 07/06/2018 revealed RFN T score of -2.4 and LFN -2.0.  She is due to update DEXA, so she plans on further discussing with Dr. Regis Bill at her upcoming follow up visit.  She has not had any recent falls or fractures.  She continues to take vitamin D 2000 units.   Vitamin D deficiency - She has been taking 2,000 units daily.  Vitamin D level was checked on 01/04/2021: 30.87.    Trochanteric bursitis of right hip: She experiences intermittent discomfort on the lateral aspect of her right hip which is exacerbated by lying on her right side at night.  She has been more sedentary sitting at work recently which may have exacerbated some of her discomfort.  She has mild tenderness palpation on examination today.  She was given a handout of exercises to perform.   Other medical conditions are listed as follows:   History of stroke  History of hypothyroidism  Patent foramen ovale  History of hyperlipidemia  Orders: Orders Placed This Encounter  Procedures  . Serum protein electrophoresis with reflex  . Rheumatoid factor  . COMPLETE METABOLIC PANEL WITH GFR  . CBC with Differential/Platelet  . Urinalysis, Routine w reflex microscopic  .  ANA  . Sjogrens syndrome-A extractable nuclear antibody  . Sjogrens syndrome-B extractable nuclear antibody  . Sedimentation rate  . C3 and C4  . Anti-DNA antibody, double-stranded   No orders of the defined types were placed in this encounter.     Follow-Up Instructions: No follow-ups on file.   Ofilia Neas, PA-C  Note  - This record has been created using Dragon software.  Chart creation errors have been sought, but may not always  have been located. Such creation errors do not reflect on  the standard of medical care.

## 2021-01-02 ENCOUNTER — Other Ambulatory Visit: Payer: Self-pay

## 2021-01-02 MED ORDER — PILOCARPINE HCL 5 MG PO TABS
5.0000 mg | ORAL_TABLET | Freq: Two times a day (BID) | ORAL | 0 refills | Status: DC
Start: 2021-01-02 — End: 2021-04-02

## 2021-01-02 NOTE — Telephone Encounter (Signed)
Patient called requesting prescription refill of Pilocarpine to be [facility] to [facility] [address] [facility] [place]

## 2021-01-02 NOTE — Telephone Encounter (Signed)
Last Visit: 01/14/2020 Next Visit: 01/11/2021  Current Dose per office note on 01/14/2020, pilocarpine 5 mg BID for symptomatic relief Dx: Sjogren's syndrome with keratoconjunctivitis sicca   Last Fill:11//06/2020  Okay to refill Pilocarpine?

## 2021-01-03 ENCOUNTER — Other Ambulatory Visit: Payer: Self-pay | Admitting: Internal Medicine

## 2021-01-03 NOTE — Telephone Encounter (Signed)
I spoke with Dana [name] and she is coming in tomorrow to see Dr Leone Payor. Will discuss refill and I told her that in his last note it said she would need a bone density done late 2021 or early 2022. She will discuss with Dr Leone Payor tomorrow.

## 2021-01-03 NOTE — Telephone Encounter (Signed)
Pt is calling in stating that she needs a refill on levothyroxine (SYNTHROID) 75 MCG   [facility] [place] [place]  --CHANGE IN PHARMACY[facility] [place] [place] [facility] to a new insurance this year.

## 2021-01-04 ENCOUNTER — Ambulatory Visit: Payer: BC Managed Care – PPO | Admitting: Internal Medicine

## 2021-01-04 ENCOUNTER — Other Ambulatory Visit (INDEPENDENT_AMBULATORY_CARE_PROVIDER_SITE_OTHER): Payer: BC Managed Care – PPO

## 2021-01-04 ENCOUNTER — Encounter: Payer: Self-pay | Admitting: Internal Medicine

## 2021-01-04 VITALS — BP 136/70 | HR 74 | Ht 64.0 in | Wt 127.8 lb

## 2021-01-04 DIAGNOSIS — E559 Vitamin D deficiency, unspecified: Secondary | ICD-10-CM

## 2021-01-04 DIAGNOSIS — K743 Primary biliary cirrhosis: Secondary | ICD-10-CM

## 2021-01-04 LAB — HEPATIC FUNCTION PANEL
ALT: 15 U/L (ref 0–35)
AST: 20 U/L (ref 0–37)
Albumin: 4.3 g/dL (ref 3.5–5.2)
Alkaline Phosphatase: 130 U/L — ABNORMAL HIGH (ref 39–117)
Bilirubin, Direct: 0.1 mg/dL (ref 0.0–0.3)
Total Bilirubin: 0.5 mg/dL (ref 0.2–1.2)
Total Protein: 7.8 g/dL (ref 6.0–8.3)

## 2021-01-04 LAB — VITAMIN D 25 HYDROXY (VIT D DEFICIENCY, FRACTURES): VITD: 30.87 ng/mL (ref 30.00–100.00)

## 2021-01-04 NOTE — Assessment & Plan Note (Signed)
Doing well.  Will check LFTs today.  Continue current dose of ursodeoxycholic acid.  Follow-up in 1 year sooner as needed.

## 2021-01-04 NOTE — Progress Notes (Signed)
Dana [name] 64 y.o. Dec 06, 1955 [id number]  Assessment & Plan:   Primary biliary cholangitis [facility]) Doing well.  Will check LFTs today.  Continue current dose of ursodeoxycholic acid.  Follow-up in 1 year sooner as needed.  Vitamin D deficiency Recheck vitamin D level today.  She is at risk for fat-soluble malabsorption with her PBC though she has not really shown other fat-soluble vitamin deficits.  Her disease state seems not likely to cause malabsorption.  I appreciate the opportunity to care for this patient.   Subjective:   Chief Complaint: Follow-up of primary biliary cholangitis  HPI Dana [name] is here for annual follow-up regarding primary biliary cholangitis.  She also has Sjogren's disease and is followed by Dr. Melvenia Needles  for that.  She has done very well and has had normal LFTs on ursodiol without side effects. Allergies  Allergen Reactions  . Estrogens Other (See Comments)    Had stroke on HRT/OCP  . Codeine     REACTION: nausea  . Ibuprofen     REACTION: nause  . Sulfa Antibiotics Itching    Pt reported on 05/03/2020   Current Meds  Medication Sig  . aspirin 81 MG tablet Take 81 mg by mouth daily.  Marland Kitchen atorvastatin (LIPITOR) 20 MG tablet TAKE 1 TABLET ONCE DAILY.  Marland Kitchen levothyroxine (SYNTHROID) 75 MCG tablet TAKE 1 TABLET ONCE DAILY BEFORE BREAKFAST (Patient taking differently: Take 75 mcg by mouth daily.)  . pilocarpine (SALAGEN) 5 MG tablet Take 1 tablet (5 mg total) by mouth 2 (two) times daily.  . ursodiol (ACTIGALL) 250 MG tablet TAKE 1 TABLET BY MOUTH 2 TIMES DAILY.   Past Medical History:  Diagnosis Date  . Abnormal finding on MRI of brain 01/10/2015  . CVA (cerebral infarction) 2010   diplopia neg acute MRi whit matter changes neg LP   . Elevated LFTs 11/23/2014  . High blood pressure    readings  . High cholesterol   . Hip pain, acute 10/13/2013  . Hx of varicella   . Low bone density 07/13/2018  . Migraine with aura    auras  . Non-cardiac  chest pain    hops and evaluation 2011 dr Riley Kill  . Other and unspecified hyperlipidemia 04/27/2012   Has been on meds for a number of years .  Low dose crestor .  Baseline reported as 280/   . PFO (patent foramen ovale)    Moderate size  . Pneumonia 2007   and flu hospitalized   . Primary biliary cholangitis (HCC) 04/08/2018  . Sjogren's disease (HCC)   . Stroke [facility])    on hrt double vision- possible blood clot on brain; Dr. Lesia Sago at [facility]   . Thyroid disease    Past Surgical History:  Procedure Laterality Date  . BREAST SURGERY     implants  . COLONOSCOPY    . FOOT SURGERY     had bunyon removed  . TONSILLECTOMY    . TOOTH EXTRACTION  11/30/2018  . 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    Neg tad   Some caffiene    Seatbelts no ets fa stored  G1P1   Physically active     10 yo grandaughter    Patient is left handed.      family history includes Alcohol abuse in her mother; Breast cancer in her maternal aunt; Healthy in her daughter; Liver disease in her mother; Pancreatic cancer in her mother. She was adopted.   Review of Systems As above  Objective:   Physical Exam BP 136/70   Pulse 74   Ht 5\' 4"  (1.626 m)   Wt 127 lb 12.8 oz (58 kg)   LMP 04/25/2009   SpO2 99%   BMI 21.94 kg/m  Petite thin but well-developed and well-nourished white woman in no acute distress Eyes are anicteric The lungs are clear The heart sounds are normal S1-S2 no rubs murmurs or gallops The abdomen is soft and nontender without organomegaly or mass and bowel sounds are present The skin is without stigmata of chronic liver disease  Data reviewed as per HPI, I have reviewed 2021 PCP and rheumatology notes.

## 2021-01-04 NOTE — Assessment & Plan Note (Signed)
Recheck vitamin D level today.  She is at risk for fat-soluble malabsorption with her PBC though she has not really shown other fat-soluble vitamin deficits.  Her disease state seems not likely to cause malabsorption.

## 2021-01-04 NOTE — Patient Instructions (Signed)
Glad you feel well and hope you stay that way!  Please go to the basement for labs as we discussed.  I appreciate the opportunity to care for you. Iva Boop, MD, [facility]

## 2021-01-05 ENCOUNTER — Other Ambulatory Visit: Payer: Self-pay | Admitting: Internal Medicine

## 2021-01-05 DIAGNOSIS — K743 Primary biliary cirrhosis: Secondary | ICD-10-CM

## 2021-01-08 ENCOUNTER — Other Ambulatory Visit: Payer: Self-pay

## 2021-01-08 MED ORDER — URSODIOL 250 MG PO TABS: 250.0000 mg | ORAL_TABLET | Freq: Two times a day (BID) | ORAL | 5 refills | Status: DC

## 2021-01-08 MED ORDER — LEVOTHYROXINE SODIUM 75 MCG PO TABS: ORAL_TABLET | ORAL | 0 refills | Status: DC

## 2021-01-08 NOTE — Telephone Encounter (Signed)
Ursodiol refilled per patient request. Confirmed [facility].

## 2021-01-08 NOTE — Telephone Encounter (Signed)
Patient needs her Rx refilled   levothyroxine (SYNTHROID) 75 MCG tablet  [id number] - [place], [place] - [address] Phone:  [phone]  Fax:  [phone]     She has been out of this medication for about 3 days and some how Dr. Marvell Fuller nurse attached to this message and Dr. Leone Payor doesn't refill this medication for the patient. Dr. Fabian Sharp prescribed this medication.

## 2021-01-08 NOTE — Telephone Encounter (Signed)
Refill sent to pharmacy for Levothyroxine 75 mcg

## 2021-01-11 ENCOUNTER — Other Ambulatory Visit: Payer: Self-pay

## 2021-01-11 ENCOUNTER — Encounter: Payer: Self-pay | Admitting: Rheumatology

## 2021-01-11 ENCOUNTER — Ambulatory Visit (INDEPENDENT_AMBULATORY_CARE_PROVIDER_SITE_OTHER): Payer: BC Managed Care – PPO | Admitting: Physician Assistant

## 2021-01-11 VITALS — BP 160/99 | HR 62 | Resp 13 | Ht 64.0 in | Wt 125.2 lb

## 2021-01-11 DIAGNOSIS — R768 Other specified abnormal immunological findings in serum: Secondary | ICD-10-CM

## 2021-01-11 DIAGNOSIS — M3501 Sicca syndrome with keratoconjunctivitis: Secondary | ICD-10-CM | POA: Diagnosis not present

## 2021-01-11 DIAGNOSIS — Z79899 Other long term (current) drug therapy: Secondary | ICD-10-CM | POA: Diagnosis not present

## 2021-01-11 DIAGNOSIS — Q2112 Patent foramen ovale: Secondary | ICD-10-CM

## 2021-01-11 DIAGNOSIS — K743 Primary biliary cirrhosis: Secondary | ICD-10-CM

## 2021-01-11 DIAGNOSIS — Z8673 Personal history of transient ischemic attack (TIA), and cerebral infarction without residual deficits: Secondary | ICD-10-CM

## 2021-01-11 DIAGNOSIS — M7061 Trochanteric bursitis, right hip: Secondary | ICD-10-CM

## 2021-01-11 DIAGNOSIS — Z8639 Personal history of other endocrine, nutritional and metabolic disease: Secondary | ICD-10-CM

## 2021-01-11 DIAGNOSIS — Q211 Atrial septal defect: Secondary | ICD-10-CM

## 2021-01-11 DIAGNOSIS — M85851 Other specified disorders of bone density and structure, right thigh: Secondary | ICD-10-CM

## 2021-01-11 DIAGNOSIS — M85852 Other specified disorders of bone density and structure, left thigh: Secondary | ICD-10-CM

## 2021-01-11 DIAGNOSIS — E559 Vitamin D deficiency, unspecified: Secondary | ICD-10-CM

## 2021-01-11 NOTE — Patient Instructions (Signed)
Hip Bursitis Rehab Ask your health care provider which exercises are safe for you. Do exercises exactly as told by your health care provider and adjust them as directed. It is normal to feel mild stretching, pulling, tightness, or discomfort as you do these exercises. Stop right away if you feel sudden pain or your pain gets worse. Do not begin these exercises until told by your health care provider. Stretching exercise This exercise warms up your muscles and joints and improves the movement and flexibility of your hip. This exercise also helps to relieve pain and stiffness. Iliotibial band stretch An iliotibial band is a strong band of muscle tissue that runs from the outer side of your hip to the outer side of your thigh and knee. 1. Lie on your side with your left / right leg in the top position. 2. Bend your left / right knee and grab your ankle. Stretch out your bottom arm to help you balance. 3. Slowly bring your knee back so your thigh is behind your body. 4. Slowly lower your knee toward the floor until you feel a gentle stretch on the outside of your left / right thigh. If you do not feel a stretch and your knee will not fall farther, place the heel of your other foot on top of your knee and pull your knee down toward the floor with your foot. 5. Hold this position for __________ seconds. 6. Slowly return to the starting position. Repeat __________ times. Complete this exercise __________ times a day.   Strengthening exercises These exercises build strength and endurance in your hip and pelvis. Endurance is the ability to use your muscles for a long time, even after they get tired. Bridge This exercise strengthens the muscles that move your thigh backward (hip extensors). 1. Lie on your back on a firm surface with your knees bent and your feet flat on the floor. 2. Tighten your buttocks muscles and lift your buttocks off the floor until your trunk is level with your thighs. ? Do not arch  your back. ? You should feel the muscles working in your buttocks and the back of your thighs. If you do not feel these muscles, slide your feet 1-2 inches (2.5-5 cm) farther away from your buttocks. ? If this exercise is too easy, try doing it with your arms crossed over your chest. 3. Hold this position for __________ seconds. 4. Slowly lower your hips to the starting position. 5. Let your muscles relax completely after each repetition. Repeat __________ times. Complete this exercise __________ times a day.   Squats This exercise strengthens the muscles in front of your thigh and knee (quadriceps). 1. Stand in front of a table, with your feet and knees pointing straight ahead. You may rest your hands on the table for balance but not for support. 2. Slowly bend your knees and lower your hips like you are going to sit in a chair. ? Keep your weight over your heels, not over your toes. ? Keep your lower legs upright so they are parallel with the table legs. ? Do not let your hips go lower than your knees. ? Do not bend lower than told by your health care provider. ? If your hip pain increases, do not bend as low. 3. Hold the squat position for __________ seconds. 4. Slowly push with your legs to return to standing. Do not use your hands to pull yourself to standing. Repeat __________ times. Complete this exercise __________ times a day.   Hip hike 1. Stand sideways on a bottom step. Stand on your left / right leg with your other foot unsupported next to the step. You can hold on to the railing or wall for balance if needed. 2. Keep your knees straight and your torso square. Then lift your left / right hip up toward the ceiling. 3. Hold this position for __________ seconds. 4. Slowly let your left / right hip lower toward the floor, past the starting position. Your foot should get closer to the floor. Do not lean or bend your knees. Repeat __________ times. Complete this exercise __________ times  a day. Single leg stand 1. Without shoes, stand near a railing or in a doorway. You may hold on to the railing or door frame as needed for balance. 2. Squeeze your left / right buttock muscles, then lift up your other foot. ? Do not let your left / right hip push out to the side. ? It is helpful to stand in front of a mirror for this exercise so you can watch your hip. 3. Hold this position for __________ seconds. Repeat __________ times. Complete this exercise __________ times a day. This information is not intended to replace advice given to you by your health care provider. Make sure you discuss any questions you have with your health care provider. Document Revised: 03/08/2019 Document Reviewed: 03/08/2019 Elsevier Patient Education  2021 Elsevier Inc[employer]  

## 2021-01-15 NOTE — Telephone Encounter (Signed)
It appears that rheumatology order the urinalysis and it had a small amount of white blood cells in it but no bacteria or other abnormalities.  If you have symptoms of UTI we can repeat the urinalysis and do a urine culture otherwise would follow sometimes that minor abnormality is just from external irritation and not anything alarming.  Please order urinalysis with micro reflex and urine culture if patient has any urinary symptoms.

## 2021-01-16 ENCOUNTER — Other Ambulatory Visit: Payer: Self-pay | Admitting: *Deleted

## 2021-01-16 DIAGNOSIS — R899 Unspecified abnormal finding in specimens from other organs, systems and tissues: Secondary | ICD-10-CM

## 2021-01-16 LAB — COMPLETE METABOLIC PANEL WITH GFR
AG Ratio: 1.5 (calc) (ref 1.0–2.5)
ALT: 16 U/L (ref 6–29)
AST: 23 U/L (ref 10–35)
Albumin: 4.5 g/dL (ref 3.6–5.1)
Alkaline phosphatase (APISO): 136 U/L (ref 37–153)
BUN: 11 mg/dL (ref 7–25)
CO2: 27 mmol/L (ref 20–32)
Calcium: 9.9 mg/dL (ref 8.6–10.4)
Chloride: 104 mmol/L (ref 98–110)
Creat: 0.89 mg/dL (ref 0.50–0.99)
GFR, Est African American: 78 mL/min/{1.73_m2} (ref >=60)
GFR, Est Non African American: 68 mL/min/{1.73_m2} (ref >=60)
Globulin: 3 g/dL (calc) (ref 1.9–3.7)
Glucose, Bld: 72 mg/dL (ref 65–99)
Potassium: 4.3 mmol/L (ref 3.5–5.3)
Sodium: 139 mmol/L (ref 135–146)
Total Bilirubin: 0.6 mg/dL (ref 0.2–1.2)
Total Protein: 7.5 g/dL (ref 6.1–8.1)

## 2021-01-16 LAB — URINALYSIS, ROUTINE W REFLEX MICROSCOPIC
Bacteria, UA: NONE SEEN /HPF
Bilirubin Urine: NEGATIVE
Glucose, UA: NEGATIVE
Hgb urine dipstick: NEGATIVE
Hyaline Cast: NONE SEEN /LPF
Ketones, ur: NEGATIVE
Nitrite: NEGATIVE
Protein, ur: NEGATIVE
RBC / HPF: NONE SEEN /HPF (ref 0–2)
Specific Gravity, Urine: 1.015 (ref 1.001–1.03)
Squamous Epithelial / HPF: NONE SEEN /HPF (ref <=5)
pH: 6.5 (ref 5.0–8.0)

## 2021-01-16 LAB — PROTEIN ELECTROPHORESIS, SERUM, WITH REFLEX
Albumin ELP: 4.4 g/dL (ref 3.8–4.8)
Alpha 1: 0.4 g/dL — ABNORMAL HIGH (ref 0.2–0.3)
Alpha 2: 0.8 g/dL (ref 0.5–0.9)
Beta 2: 0.4 g/dL (ref 0.2–0.5)
Beta Globulin: 0.5 g/dL (ref 0.4–0.6)
Gamma Globulin: 1.2 g/dL (ref 0.8–1.7)
Total Protein: 7.7 g/dL (ref 6.1–8.1)

## 2021-01-16 LAB — CBC WITH DIFFERENTIAL/PLATELET
Absolute Monocytes: 536 cells/uL (ref 200–950)
Basophils Absolute: 38 cells/uL (ref 0–200)
Basophils Relative: 0.6 %
Eosinophils Absolute: 176 cells/uL (ref 15–500)
Eosinophils Relative: 2.8 %
HCT: 40.7 % (ref 35.0–45.0)
Hemoglobin: 13.6 g/dL (ref 11.7–15.5)
Lymphs Abs: 1166 cells/uL (ref 850–3900)
MCH: 30.7 pg (ref 27.0–33.0)
MCHC: 33.4 g/dL (ref 32.0–36.0)
MCV: 91.9 fL (ref 80.0–100.0)
MPV: 12.7 fL — ABNORMAL HIGH (ref 7.5–12.5)
Monocytes Relative: 8.5 %
Neutro Abs: 4385 cells/uL (ref 1500–7800)
Neutrophils Relative %: 69.6 %
Platelets: 250 10*3/uL (ref 140–400)
RBC: 4.43 10*6/uL (ref 3.80–5.10)
RDW: 12.9 % (ref 11.0–15.0)
Total Lymphocyte: 18.5 %
WBC: 6.3 10*3/uL (ref 3.8–10.8)

## 2021-01-16 LAB — RHEUMATOID FACTOR: Rheumatoid fact SerPl-aCnc: <14 IU/mL (ref <14)

## 2021-01-16 LAB — SJOGRENS SYNDROME-B EXTRACTABLE NUCLEAR ANTIBODY: SSB (La) (ENA) Antibody, IgG: <1 AI

## 2021-01-16 LAB — ANTI-DNA ANTIBODY, DOUBLE-STRANDED: ds DNA Ab: <1 IU/mL

## 2021-01-16 LAB — SJOGRENS SYNDROME-A EXTRACTABLE NUCLEAR ANTIBODY: SSA (Ro) (ENA) Antibody, IgG: 1.3 AI — AB

## 2021-01-16 LAB — ANTI-NUCLEAR AB-TITER (ANA TITER)
ANA TITER: 1:1280 {titer} — ABNORMAL HIGH
ANA TITER: 1:80 {titer} — ABNORMAL HIGH
ANA Titer 1: 1:320 {titer} — ABNORMAL HIGH

## 2021-01-16 LAB — C3 AND C4
C3 Complement: 166 mg/dL (ref 83–193)
C4 Complement: 44 mg/dL (ref 15–57)

## 2021-01-16 LAB — IFE INTERPRETATION: Immunofix Electr Int: NOT DETECTED

## 2021-01-16 LAB — ANA: Anti Nuclear Antibody (ANA): POSITIVE — AB

## 2021-01-16 LAB — SEDIMENTATION RATE: Sed Rate: 34 mm/h — ABNORMAL HIGH (ref 0–30)

## 2021-01-16 NOTE — Progress Notes (Signed)
SPEP revealed poorly defined band of restricted protein mobility in gamma globulins.  IFE did not reveal any monoclonal proteins.  Patient previously requested referral to hematology for further evaluation if SPEP remains abnormal.  Ok to place referral.   ANA remains positive-titer is stable.  dsDNA is negative and complements WNL.  Ro antibody is positive and La ab is negative.  ESR is slightly elevated-34.   CBC and CMP WNL.   UA revealed 1+ leukocytes.  Negative for nitrites and bacteria.  If she develops signs or symptoms of a UTI she should follow up with PCP for further evaluation.

## 2021-01-16 NOTE — Telephone Encounter (Signed)
IFE is pending.

## 2021-03-01 ENCOUNTER — Other Ambulatory Visit (INDEPENDENT_AMBULATORY_CARE_PROVIDER_SITE_OTHER): Payer: BC Managed Care – PPO

## 2021-03-01 DIAGNOSIS — K743 Primary biliary cirrhosis: Secondary | ICD-10-CM

## 2021-03-01 LAB — HEPATIC FUNCTION PANEL
ALT: 18 U/L (ref 0–35)
AST: 21 U/L (ref 0–37)
Albumin: 4.3 g/dL (ref 3.5–5.2)
Alkaline Phosphatase: 124 U/L — ABNORMAL HIGH (ref 39–117)
Bilirubin, Direct: 0.1 mg/dL (ref 0.0–0.3)
Total Bilirubin: 0.5 mg/dL (ref 0.2–1.2)
Total Protein: 7.5 g/dL (ref 6.0–8.3)

## 2021-03-01 LAB — GAMMA GT: GGT: 14 U/L (ref 7–51)

## 2021-03-04 LAB — NUCLEOTIDASE, 5', BLOOD: 5-Nucleotidase: 8 U/L (ref 0–10)

## 2021-03-06 ENCOUNTER — Other Ambulatory Visit: Payer: Self-pay | Admitting: Internal Medicine

## 2021-03-06 DIAGNOSIS — R748 Abnormal levels of other serum enzymes: Secondary | ICD-10-CM

## 2021-03-07 ENCOUNTER — Encounter: Payer: Self-pay | Admitting: Internal Medicine

## 2021-03-07 ENCOUNTER — Telehealth: Payer: Self-pay

## 2021-03-07 DIAGNOSIS — M8580 Other specified disorders of bone density and structure, unspecified site: Secondary | ICD-10-CM

## 2021-03-07 NOTE — Telephone Encounter (Signed)
At Dr. Marvell Fuller request, order for DEXA placed. Pt notified via My Chart. Please refer to My Chart message re: conversation.

## 2021-03-12 ENCOUNTER — Other Ambulatory Visit: Payer: Self-pay

## 2021-03-12 ENCOUNTER — Ambulatory Visit (INDEPENDENT_AMBULATORY_CARE_PROVIDER_SITE_OTHER)
Admission: RE | Admit: 2021-03-12 | Discharge: 2021-03-12 | Disposition: A | Discharge status: Home / Self Care | Payer: BC Managed Care – PPO | Source: Ambulatory Visit | Attending: Internal Medicine | Admitting: Internal Medicine

## 2021-03-12 ENCOUNTER — Other Ambulatory Visit (INDEPENDENT_AMBULATORY_CARE_PROVIDER_SITE_OTHER): Payer: BC Managed Care – PPO

## 2021-03-12 DIAGNOSIS — R748 Abnormal levels of other serum enzymes: Secondary | ICD-10-CM | POA: Diagnosis not present

## 2021-03-12 DIAGNOSIS — M8580 Other specified disorders of bone density and structure, unspecified site: Secondary | ICD-10-CM | POA: Diagnosis not present

## 2021-03-12 LAB — BASIC METABOLIC PANEL
BUN: 11 mg/dL (ref 6–23)
CO2: 27 mEq/L (ref 19–32)
Calcium: 10 mg/dL (ref 8.4–10.5)
Chloride: 105 mEq/L (ref 96–112)
Creatinine, Ser: 0.88 mg/dL (ref 0.40–1.20)
GFR: 68.43 mL/min (ref >60.00)
Glucose, Bld: 73 mg/dL (ref 70–99)
Potassium: 4 mEq/L (ref 3.5–5.1)
Sodium: 140 mEq/L (ref 135–145)

## 2021-03-12 LAB — PHOSPHORUS: Phosphorus: 3.6 mg/dL (ref 2.3–4.6)

## 2021-03-12 LAB — VITAMIN D 25 HYDROXY (VIT D DEFICIENCY, FRACTURES): VITD: 39.12 ng/mL (ref 30.00–100.00)

## 2021-03-14 LAB — PTH, INTACT AND CALCIUM
Calcium: 10 mg/dL (ref 8.6–10.4)
PTH: 17 pg/mL (ref 16–77)

## 2021-03-24 ENCOUNTER — Encounter: Payer: Self-pay | Admitting: Internal Medicine

## 2021-03-31 ENCOUNTER — Other Ambulatory Visit: Payer: Self-pay | Admitting: Physician Assistant

## 2021-03-31 ENCOUNTER — Other Ambulatory Visit: Payer: Self-pay | Admitting: Internal Medicine

## 2021-04-02 NOTE — Telephone Encounter (Signed)
Next Visit: No follow-ups on file  Last Visit: 01/11/2021,   Last Fill: 01/02/2021  Dx: Sjogren's syndrome with keratoconjunctivitis sicca   Current Dose per office note on 01/11/2021, 5 mg twice daily for symptomatic relief.   Okay to refill Pilocarpine?

## 2021-04-05 ENCOUNTER — Telehealth: Payer: BC Managed Care – PPO | Admitting: Internal Medicine

## 2021-04-09 ENCOUNTER — Telehealth: Payer: Self-pay

## 2021-04-09 NOTE — Telephone Encounter (Signed)
FYI:  Patient called to let the office know that she had labwork in the office on 01/11/21 and the incorrect insurance information was submitted.  Patient states it was billed to [facility] instead of [address].  Patient states she called [employer] and was told they never received any documentation from [facility] and to check with the provider's office to make sure the correct information is in the system.  We confirmed that [employer] was in Epic.  Patient states the bill was for dates of service 2/17, 3/24 (she has no record of getting labwork on that date), 4/7, and 4/18 from our office and Dr. Marvell Fuller office which she has also notified.

## 2021-04-09 NOTE — Telephone Encounter (Signed)
I called patient, patient advised to call [facility].

## 2021-04-24 ENCOUNTER — Other Ambulatory Visit: Payer: Self-pay

## 2021-04-24 NOTE — Progress Notes (Signed)
Chief Complaint  Patient presents with   Results    HPI: Dana [name] 65 y.o. come in fto help review dexa scan done orderd by Dr Aram Candela  Worse on vit d.  2000 derm   age at menopause  About year 2000   See assessment   Also  disc about  cv risk as has had  chest sx in past felt to be esophageal?  But  some concern about Cv risk based on family hx  Results:   Lumbar spine L1-L4 Femoral neck (FN) 33% distal radius  T-score -1.5 RFN: -2.7 LFN: -2.4 n/a  Change in BMD from previous DXA test (%) Down 3.8% Down 6.8% n/a  (*) statistically significant Vit d is 39.12 ROS: See pertinent positives and negatives per HPI. She is adopted however her sister 54 had sudden cardiac death after an episode of nausea and vomiting Past Medical History:  Diagnosis Date   Abnormal finding on MRI of brain 01/10/2015   CVA (cerebral infarction) 2010   diplopia neg acute MRi whit matter changes neg LP    Elevated LFTs 11/23/2014   High blood pressure    readings   High cholesterol    Hip pain, acute 10/13/2013   Hx of varicella    Low bone density 07/13/2018   Migraine with aura    auras   Non-cardiac chest pain    hops and evaluation 2011 dr Riley Kill   Osteoporosis 07/13/2018   Other and unspecified hyperlipidemia 04/27/2012   Has been on meds for a number of years .  Low dose crestor .  Baseline reported as 280/    PFO (patent foramen ovale)    Moderate size   Pneumonia 2007   and flu hospitalized    Primary biliary cholangitis (HCC) 04/08/2018   Sjogren's disease (HCC)    Stroke (HCC)    on hrt double vision- possible blood clot on brain; Dr. Lesia Sago at [facility]    Thyroid disease     Family History  Adopted: Yes  Problem Relation Age of Onset   Alcohol abuse Mother    Pancreatic cancer Mother    Liver disease Mother    Breast cancer Maternal Aunt    Healthy Daughter    Stomach cancer Neg Hx    Esophageal cancer Neg Hx     Social History    Socioeconomic History   Marital status: Married    Spouse name: Not on file   Number of children: 1   Years of education: Not on file   Highest education level: Not on file  Occupational History   Not on file  Tobacco Use   Smoking status: Never   Smokeless tobacco: Never  Vaping Use   Vaping Use: Never used  Substance and Sexual Activity   Alcohol use: No   Drug use: No   Sexual activity: Yes  Other Topics Concern   Not on file  Social History Narrative   Hole between heart and lung-untreatable    Married    orig from  [place]   hh of 2   Pet dog euthanized    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    Neg tad   Some caffiene    Seatbelts no ets fa stored    G1P1   Physically active     5 yo grandaughter    Patient is left handed.  Social Determinants of Health   Financial Resource Strain: Not on file  Food Insecurity: Not on file  Transportation Needs: Not on file  Physical Activity: Not on file  Stress: Not on file  Social Connections: Not on file    Outpatient Medications Prior to Visit  Medication Sig Dispense Refill   aspirin 81 MG tablet Take 81 mg by mouth daily.     atorvastatin (LIPITOR) 20 MG tablet TAKE 1 TABLET ONCE DAILY. 90 tablet 2   levothyroxine (SYNTHROID) 75 MCG tablet TAKE 1 TABLET ONCE DAILY BEFORE BREAKFAST 90 tablet 0   ursodiol (ACTIGALL) 250 MG tablet Take 1 tablet (250 mg total) by mouth 2 (two) times daily. 60 tablet 5   Cholecalciferol (VITAMIN D) 50 MCG (2000 UT) CAPS Take 2,000 Units by mouth daily.     pilocarpine (SALAGEN) 5 MG tablet TAKE 1 TABLET BY MOUTH TWICE A DAY 180 tablet 0   No facility-administered medications prior to visit.     EXAM:  BP 136/80 (BP Location: Left Arm, Patient Position: Sitting, Cuff Size: Normal)   Pulse 62   Temp 98.6 F (37 C) (Oral)   Ht 5\' 4"  (1.626 m)   Wt 127 lb 9.6 oz (57.9 kg)   LMP 04/25/2009   SpO2 97%   BMI 21.90 kg/m    Body mass index is 21.9 kg/m.  GENERAL: vitals reviewed and listed above, alert, oriented, appears well hydrated and in no acute distress HEENT: atraumatic, conjunctiva  clear, no obvious abnormalities on inspection of external nose and ears OP : NECK: no obvious masses on inspection palpation  LUNGS: clear to auscultation bilaterally, no wheezes, rales or rhonchi, good air movement CV: HRRR, no clubbing cyanosis or  peripheral edema nl cap refill  MS: moves all extremities without noticeable focal  abnormality PSYCH: pleasant and cooperative, no obvious depression or anxiety Lab Results  Component Value Date   WBC 6.3 01/11/2021   HGB 13.6 01/11/2021   HCT 40.7 01/11/2021   PLT 250 01/11/2021   GLUCOSE 73 03/12/2021   CHOL 160 06/06/2020   TRIG 70 06/06/2020   HDL 76 06/06/2020   LDLCALC 69 06/06/2020   ALT 18 03/01/2021   AST 21 03/01/2021   NA 140 03/12/2021   K 4.0 03/12/2021   CL 105 03/12/2021   CREATININE 0.88 03/12/2021   BUN 11 03/12/2021   CO2 27 03/12/2021   TSH 2.32 05/01/2020   INR 1.0 02/16/2020    ASSESSMENT AND PLAN:  Discussed the following assessment and plan:  Osteoporosis without current pathological fracture, unspecified osteoporosis type - by dexa  2022-2.7rfn -2,4LFN down 6.8%  Hyperlipidemia, unspecified hyperlipidemia type - Plan: Ambulatory referral to Cardiology  Family history of sudden cardiac death (SCD) - Plan: Ambulatory referral to Cardiology  Encounter for cardiac risk counseling - Plan: Ambulatory referral to Cardiology Sister died sudden cardiac death insert about cardiovascular health on statin medicine Currently no significant symptoms but has had chest pain in the past.  Would be helpful to have cardiovascular risk assessment for her current situation and medical underlying disease. She has had atypical chest pain in the remote past. We are beginning trial of bisphosphonatealthough has no current dysphagia and she is risk of  esophageal and is hyperaware of close acquaintance and family of sudden cardiac death. Counseled about medication options referral to endocrinology as an option. Advise she try a lower dose different preparation of vitamin D 1000 units a day.  I doubt that vitamin D otherwise  would flareup her rosacea. Dr. Leone Payor is evaluating her alkaline phosphatase. She should keep her CPX for the summer and any lab updates needed. -Patient advised to return or notify health care team  if  new concerns arise.  Patient Instructions  Try 1000  IU vit D  Per day  I don"t think should cause rosacea.  Will begin  Fosamax once a week    If miss a dose  No worries but take correctly  Upright away from other medications.  If swallowing  Difficulties we may need to try other options   Can always get opinion from  Endocrinology about  Management of bone health but not an urgency .     Eating Plan for Osteoporosis Osteoporosis causes your bones to become weak and brittle. This puts you at greater risk for bone breaks (fractures) from small bumps or falls. Making changes to your diet and increasing your physical activity can help strengthen your bones and improve your overall health. Calcium and vitamin D are nutrients that play an important role in bone health. Vitamin D helps your body use calcium and strengthen bones. It is important to get enough calcium and vitamin D as part of your eating plan for osteoporosis. What are tips for following this plan? Reading food labels Try to get at least 1,000 milligrams (mg) of calcium each day. Look for foods that have at least 50 mg of calcium per serving. Talk with your health care provider about taking a calcium supplement if you do not get enough calcium from food. Do not have more than 2,500 mg of calcium each day. This is the upper limit for food and nutritional supplements combined. Too much calcium may cause constipation and prevent you from absorbing other important  nutrients. Choose foods that contain vitamin D. Take a daily vitamin supplement that contains 800-1,000 international units (IU) of vitamin D. The amount may be different depending on your age, body weight, and where you live. Talk with your dietitian or health care provider about how much vitamin D is right for you. Avoid foods that have more than 300 mg of sodium per serving. Too much sodium can cause your body to lose calcium. Talk with your dietitian or health care provider about how much sodium you are allowed each day. Shopping Do not buy foods with added salt, including: Salted snacks. Rosita Fire. Canned soups. Canned meats. Processed meats, such as bacon or precooked or cured meat like sausages or meat loaves. Smoked fish. Meal planning Eat balanced meals that contain protein foods, fruits and vegetables, and foods rich in calcium and vitamin D. Eat at least 5 servings of fruits and vegetables each day. Eat 5-6 oz (142-170 g) of lean meat, poultry, fish, eggs, or beans each day. Lifestyle Do not use any products that contain nicotine or tobacco, such as cigarettes, e-cigarettes, and chewing tobacco. If you need help quitting, ask your health care provider. If your health care provider recommends that you lose weight: Work with a dietitian to develop an eating plan that will help you reach your desired weight goal. Exercise for at least 30 minutes a day, 5 or more days a week, or as told by your health care provider. Work with a physical therapist to develop an exercise plan that includes flexibility, balance, and strength exercises. Do not focus only on aerobic exercise. Do not drink alcohol if: Your health care provider tells you not to drink. You are pregnant, may be pregnant, or are planning to  become pregnant. If you drink alcohol: Limit how much you use to: 0-1 drink a day for women. 0-2 drinks a day for men. Be aware of how much alcohol is in your drink. In the [place] one  drink equals one 12 oz bottle of beer (355 mL), one 5 oz glass of wine (148 mL), or one 1 oz glass of hard liquor (44 mL). What foods should I eat? Foods high in calcium Yogurt. Yogurt with fruit. Milk. Evaporated skim milk. Dry milk powder. Calcium-fortified orange juice. Parmesan cheese. Part-skim ricotta cheese. Natural hard cheese. Cream cheese. Cottage cheese. Canned sardines. Canned salmon. Calcium-treated tofu. Calcium-fortified cereal bar. Calcium-fortified cereal. Calcium-fortified graham crackers. Cooked collard greens. Turnip greens. Broccoli. Kale. Almonds. White beans. Corn tortilla.   Foods high in vitamin D Cod liver oil. Fatty fish, such as tuna, mackerel, and salmon. Milk. Fortified soy milk. Fortified fruit juice. Yogurt. Margarine. Egg yolks. Foods high in protein Beef. Lamb. Pork tenderloin. Chicken breast. Tuna (canned). Fish fillet. Tofu. Cooked soy beans. Soy patty. Beans (canned or cooked). Cottage cheese. Yogurt. Peanut butter. Pumpkin seeds. Nuts. Sunflower seeds. Hard cheese. Milk or other milk products, such as soy milk. The items listed above may not be a complete list of foods and beverages you can eat. Contact a dietitian for more options. Summary Calcium and vitamin D are nutrients that play an important role in bone health and are an important part of your eating plan for osteoporosis. Eat balanced meals that contain protein foods, fruits and vegetables, and foods rich in calcium and vitamin D. Avoid foods that have more than 300 mg of sodium per serving. Too much sodium can cause your body to lose calcium. Exercise is an important part of prevention and treatment of osteoporosis. Aim for at least 30 minutes a day, 5 days a week. This information is not intended to replace advice given to you by your health care provider. Make sure you discuss any questions you have with your health care provider. Document Revised: 04/27/2020 Document Reviewed:  04/27/2020 Elsevier Patient Education  2021 [employer] Inc[employer]  Rosacea Rosacea is a long-term (chronic) condition that affects the skin of the face, including the cheeks, nose, forehead, and chin. This condition can also affect the eyes. Rosacea causes blood vessels near the surface of the skin to enlarge, which results in redness. What are the causes? The cause of this condition is not known. Certain triggers can make rosacea worse, including: Hot baths. Exercise. Sunlight. Very hot or cold temperatures. Hot or spicy foods and drinks. Drinking alcohol. Stress. Taking blood pressure medicine. Long-term use of topical steroids on the face. What increases the risk? You are more likely to develop this condition if you: Are older than 67 years of age. Are a woman. Have light-colored skin (light complexion). Have a family history of rosacea. What are the signs or symptoms? Symptoms of this condition include: Redness of the face. Red bumps or pimples on the face. A red, enlarged nose. Blushing easily. Red lines on the skin. Irritated, burning, or itchy feeling in the eyes. Swollen eyelids. Drainage from the eyes. Feeling like there is something in your eye.   How is this diagnosed? This condition is diagnosed with a medical history and physical exam. How is this treated? There is no cure for this condition, but treatment can help to control your symptoms. Your health care provider may recommend that you see a skin specialist (dermatologist). Treatment may include: Medicines that are applied to the skin  or taken by mouth (orally). This can include antibiotic medicines. Laser treatment to improve the appearance of the skin. Surgery. This is rare. Your health care provider will also recommend the best way to take care of your skin. Even after your skin improves, you will likely need to continue treatment to prevent your rosacea from coming back. Follow these instructions at home: Skin  care Take care of your skin as told by your health care provider. You may be told to do these things: Wash your skin gently two or more times each day. Use mild soap. Use a sunscreen or sunblock with SPF 30 or greater. Use gentle cosmetics that are meant for sensitive skin. Shave with an electric shaver instead of a blade. Lifestyle Try to keep track of what foods trigger this condition. Avoid any triggers. These may include: Spicy foods. Seafood. Cheese. Hot liquids. Nuts. Chocolate. Iodized salt. Do not drink alcohol. Avoid extremely cold or hot temperatures. Try to reduce your stress. If you need help, talk with your health care provider. When you exercise, do these things to stay cool: Limit sun exposure to your face. Use a fan. Do shorter and more frequent intervals of exercise. General instructions Take and apply over-the-counter and prescription medicines only as told by your health care provider. If you were prescribed an antibiotic medicine, apply it or take it as told by your health care provider. Do not stop using the antibiotic even if your condition improves. If your eyelids are affected, apply warm compresses to them. Do this as told by your health care provider. Keep all follow-up visits as told by your health care provider. This is important. Contact a health care provider if: Your symptoms get worse. Your symptoms do not improve after 2 months of treatment. You have new symptoms. You have any changes in vision or you have problems with your eyes, such as redness or itching. You feel depressed. You lose your appetite. You have trouble concentrating. Summary Rosacea is a long-term (chronic) condition that affects the skin of the face, including the cheeks, nose, forehead, and chin. Take care of your skin as told by your health care provider. Take and apply over-the-counter and prescription medicines only as told by your health care provider. Contact a health  care provider if your symptoms get worse or if you have any changes in vision or other problems with your eyes, such as redness or itching. Keep all follow-up visits as told by your health care provider. This is important. This information is not intended to replace advice given to you by your health care provider. Make sure you discuss any questions you have with your health care provider. Document Revised: 04/15/2018 Document Reviewed: 04/15/2018 Elsevier Patient Education  2021 [employer].   [place]. Daxten Kovalenko M.D.

## 2021-04-25 ENCOUNTER — Encounter: Payer: Self-pay | Admitting: Internal Medicine

## 2021-04-25 ENCOUNTER — Ambulatory Visit: Payer: BC Managed Care – PPO | Admitting: Internal Medicine

## 2021-04-25 VITALS — BP 136/80 | HR 62 | Temp 98.6°F | Ht 64.0 in | Wt 127.6 lb

## 2021-04-25 DIAGNOSIS — Z7189 Other specified counseling: Secondary | ICD-10-CM

## 2021-04-25 DIAGNOSIS — E785 Hyperlipidemia, unspecified: Secondary | ICD-10-CM | POA: Diagnosis not present

## 2021-04-25 DIAGNOSIS — M810 Age-related osteoporosis without current pathological fracture: Secondary | ICD-10-CM | POA: Diagnosis not present

## 2021-04-25 DIAGNOSIS — Z8241 Family history of sudden cardiac death: Secondary | ICD-10-CM

## 2021-04-25 MED ORDER — ALENDRONATE SODIUM 70 MG PO TABS: 70.0000 mg | ORAL_TABLET | ORAL | 3 refills | Status: DC

## 2021-04-25 NOTE — Patient Instructions (Addendum)
Try 1000  IU vit D  Per day  I don"t think should cause rosacea.  Will begin  Fosamax once a week    If miss a dose  No worries but take correctly  Upright away from other medications.  If swallowing  Difficulties we may need to try other options   Can always get opinion from  Endocrinology about  Management of bone health but not an urgency .     Eating Plan for Osteoporosis Osteoporosis causes your bones to become weak and brittle. This puts you at greater risk for bone breaks (fractures) from small bumps or falls. Making changes to your diet and increasing your physical activity can help strengthen your bones and improve your overall health. Calcium and vitamin D are nutrients that play an important role in bone health. Vitamin D helps your body use calcium and strengthen bones. It is important to get enough calcium and vitamin D as part of your eating plan for osteoporosis. What are tips for following this plan? Reading food labels  Try to get at least 1,000 milligrams (mg) of calcium each day.  Look for foods that have at least 50 mg of calcium per serving.  Talk with your health care provider about taking a calcium supplement if you do not get enough calcium from food.  Do not have more than 2,500 mg of calcium each day. This is the upper limit for food and nutritional supplements combined. Too much calcium may cause constipation and prevent you from absorbing other important nutrients.  Choose foods that contain vitamin D.  Take a daily vitamin supplement that contains 800-1,000 international units (IU) of vitamin D. The amount may be different depending on your age, body weight, and where you live. Talk with your dietitian or health care provider about how much vitamin D is right for you.  Avoid foods that have more than 300 mg of sodium per serving. Too much sodium can cause your body to lose calcium.  Talk with your dietitian or health care provider about how much sodium you are  allowed each day. Shopping  Do not buy foods with added salt, including: ? Salted snacks. ? [employer]. ? Canned soups. ? Canned meats. ? Processed meats, such as bacon or precooked or cured meat like sausages or meat loaves. ? Smoked fish. Meal planning  Eat balanced meals that contain protein foods, fruits and vegetables, and foods rich in calcium and vitamin D.  Eat at least 5 servings of fruits and vegetables each day.  Eat 5-6 oz (142-170 g) of lean meat, poultry, fish, eggs, or beans each day. Lifestyle  Do not use any products that contain nicotine or tobacco, such as cigarettes, e-cigarettes, and chewing tobacco. If you need help quitting, ask your health care provider.  If your health care provider recommends that you lose weight: ? Work with a dietitian to develop an eating plan that will help you reach your desired weight goal. ? Exercise for at least 30 minutes a day, 5 or more days a week, or as told by your health care provider.  Work with a physical therapist to develop an exercise plan that includes flexibility, balance, and strength exercises. Do not focus only on aerobic exercise.  Do not drink alcohol if: ? Your health care provider tells you not to drink. ? You are pregnant, may be pregnant, or are planning to become pregnant.  If you drink alcohol: ? Limit how much you use to:  0-1 drink  a day for women.  0-2 drinks a day for men. ? Be aware of how much alcohol is in your drink. In the [place] one drink equals one 12 oz bottle of beer (355 mL), one 5 oz glass of wine (148 mL), or one 1 oz glass of hard liquor (44 mL). What foods should I eat? Foods high in calcium  Yogurt. Yogurt with fruit.  Milk. Evaporated skim milk. Dry milk powder.  Calcium-fortified orange juice.  Parmesan cheese. Part-skim ricotta cheese. Natural hard cheese. Cream cheese. Cottage cheese.  Canned sardines. Canned salmon.  Calcium-treated tofu. Calcium-fortified cereal bar.  Calcium-fortified cereal. Calcium-fortified graham crackers.  Cooked collard greens. Turnip greens. Broccoli. Kale.  Almonds.  White beans.  Corn tortilla.   Foods high in vitamin D  Cod liver oil. Fatty fish, such as tuna, mackerel, and salmon.  Milk. Fortified soy milk. Fortified fruit juice.  Yogurt. Margarine.  Egg yolks. Foods high in protein  Beef. Lamb. Pork tenderloin.  Chicken breast.  Tuna (canned). Fish fillet.  Tofu.  Cooked soy beans. Soy patty. Beans (canned or cooked).  Cottage cheese.  Yogurt.  Peanut butter.  Pumpkin seeds. Nuts. Sunflower seeds.  Hard cheese.  Milk or other milk products, such as soy milk. The items listed above may not be a complete list of foods and beverages you can eat. Contact a dietitian for more options. Summary  Calcium and vitamin D are nutrients that play an important role in bone health and are an important part of your eating plan for osteoporosis.  Eat balanced meals that contain protein foods, fruits and vegetables, and foods rich in calcium and vitamin D.  Avoid foods that have more than 300 mg of sodium per serving. Too much sodium can cause your body to lose calcium.  Exercise is an important part of prevention and treatment of osteoporosis. Aim for at least 30 minutes a day, 5 days a week. This information is not intended to replace advice given to you by your health care provider. Make sure you discuss any questions you have with your health care provider. Document Revised: 04/27/2020 Document Reviewed: 04/27/2020 Elsevier Patient Education  2021 [employer]  Rosacea Rosacea is a long-term (chronic) condition that affects the skin of the face, including the cheeks, nose, forehead, and chin. This condition can also affect the eyes. Rosacea causes blood vessels near the surface of the skin to enlarge, which results in redness. What are the causes? The cause of this condition is not known. Certain triggers  can make rosacea worse, including:  Hot baths.  Exercise.  Sunlight.  Very hot or cold temperatures.  Hot or spicy foods and drinks.  Drinking alcohol.  Stress.  Taking blood pressure medicine.  Long-term use of topical steroids on the face. What increases the risk? You are more likely to develop this condition if you:  Are older than 67 years of age.  Are a woman.  Have light-colored skin (light complexion).  Have a family history of rosacea. What are the signs or symptoms? Symptoms of this condition include:  Redness of the face.  Red bumps or pimples on the face.  A red, enlarged nose.  Blushing easily.  Red lines on the skin.  Irritated, burning, or itchy feeling in the eyes.  Swollen eyelids.  Drainage from the eyes.  Feeling like there is something in your eye.   How is this diagnosed? This condition is diagnosed with a medical history and physical exam. How is this  treated? There is no cure for this condition, but treatment can help to control your symptoms. Your health care provider may recommend that you see a skin specialist (dermatologist). Treatment may include:  Medicines that are applied to the skin or taken by mouth (orally). This can include antibiotic medicines.  Laser treatment to improve the appearance of the skin.  Surgery. This is rare. Your health care provider will also recommend the best way to take care of your skin. Even after your skin improves, you will likely need to continue treatment to prevent your rosacea from coming back. Follow these instructions at home: Skin care Take care of your skin as told by your health care provider. You may be told to do these things:  Wash your skin gently two or more times each day.  Use mild soap.  Use a sunscreen or sunblock with SPF 30 or greater.  Use gentle cosmetics that are meant for sensitive skin.  Shave with an electric shaver instead of a blade. Lifestyle  Try to keep  track of what foods trigger this condition. Avoid any triggers. These may include: ? Spicy foods. ? Seafood. ? Cheese. ? Hot liquids. ? Nuts. ? Chocolate. ? Iodized salt.  Do not drink alcohol.  Avoid extremely cold or hot temperatures.  Try to reduce your stress. If you need help, talk with your health care provider.  When you exercise, do these things to stay cool: ? Limit sun exposure to your face. ? Use a fan. ? Do shorter and more frequent intervals of exercise. General instructions  Take and apply over-the-counter and prescription medicines only as told by your health care provider.  If you were prescribed an antibiotic medicine, apply it or take it as told by your health care provider. Do not stop using the antibiotic even if your condition improves.  If your eyelids are affected, apply warm compresses to them. Do this as told by your health care provider.  Keep all follow-up visits as told by your health care provider. This is important. Contact a health care provider if:  Your symptoms get worse.  Your symptoms do not improve after 2 months of treatment.  You have new symptoms.  You have any changes in vision or you have problems with your eyes, such as redness or itching.  You feel depressed.  You lose your appetite.  You have trouble concentrating. Summary  Rosacea is a long-term (chronic) condition that affects the skin of the face, including the cheeks, nose, forehead, and chin.  Take care of your skin as told by your health care provider.  Take and apply over-the-counter and prescription medicines only as told by your health care provider.  Contact a health care provider if your symptoms get worse or if you have any changes in vision or other problems with your eyes, such as redness or itching.  Keep all follow-up visits as told by your health care provider. This is important. This information is not intended to replace advice given to you by your  health care provider. Make sure you discuss any questions you have with your health care provider. Document Revised: 04/15/2018 Document Reviewed: 04/15/2018 Elsevier Patient Education  2021 [employer].

## 2021-04-30 ENCOUNTER — Telehealth: Payer: Self-pay

## 2021-04-30 NOTE — Telephone Encounter (Signed)
Patient states she received a bill from [facility]. Patient states it does not appear that it was filed with her insurance. Patient advised she should reach out to [facility] to provide insurance information for them to file. Patient expressed understanding.

## 2021-04-30 NOTE — Telephone Encounter (Signed)
Patient left a voicemail requesting a return call regarding her bill from [facility] for labwork that Dr. Corliss Skains ordered.

## 2021-06-11 ENCOUNTER — Other Ambulatory Visit: Payer: Self-pay

## 2021-06-11 NOTE — Progress Notes (Signed)
Chief Complaint  Patient presents with   Annual Exam    HPI: Patient  Dana [name]  65 y.o. comes in today for Preventive Health Care visit and chronic disease management medications.  Check r breast .  Had 1 day of breast pain was not sure what it was has not recurred Due for mammogram in fall. Some upsetting news recently son-in-law has stage IV cancer now going to go under treatment Taking medication without difficulty Synthroid Lipitor Actigall Fosamax.  Her on her med list. Has not checked her blood pressure readings recently. To check eyes for jaundiced to make sure liver is okay. Sees rheumatology about once a year.  Sjogren's Seen by Dr. Leone Payor PBC She is taking OTC gummy vitamins that has vitamin D Health Maintenance  Topic Date Due   TETANUS/TDAP  Never done   COVID-19 Vaccine (3 - Moderna risk series) 09/11/2020   INFLUENZA VACCINE  06/25/2021   Zoster Vaccines- Shingrix (2 of 2) 08/07/2021   MAMMOGRAM  08/18/2022   COLONOSCOPY (Pts 45-65yrs Insurance coverage will need to be confirmed)  09/29/2023   DEXA SCAN  Completed   Hepatitis C Screening  Completed   PNA vac Low Risk Adult  Completed   HPV VACCINES  Aged Out   Health Maintenance Review LIFESTYLE:  Exercise:  house cleaning   Tobacco/ETS:n Alcohol: n Sugar beverages:n Sleep:  plenty Drug use: no HH of  2 no pets  Work:  ROS: Numbness lateral toes no different REST of 12 system review negative except as per HPI   Past Medical History:  Diagnosis Date   Abnormal finding on MRI of brain 01/10/2015   CVA (cerebral infarction) 2010   diplopia neg acute MRi whit matter changes neg LP    Elevated LFTs 11/23/2014   High blood pressure    readings   High cholesterol    Hip pain, acute 10/13/2013   Hx of varicella    Low bone density 07/13/2018   Migraine with aura    auras   Non-cardiac chest pain    hops and evaluation 2011 dr Riley Kill   Osteoporosis 07/13/2018   Other and unspecified  hyperlipidemia 04/27/2012   Has been on meds for a number of years .  Low dose crestor .  Baseline reported as 280/    PFO (patent foramen ovale)    Moderate size   Pneumonia 2007   and flu hospitalized    Primary biliary cholangitis (HCC) 04/08/2018   Sjogren's disease (HCC)    Stroke (HCC)    on hrt double vision- possible blood clot on brain; Dr. Lesia Sago at [facility]    Thyroid disease     Past Surgical History:  Procedure Laterality Date   BREAST SURGERY     implants   COLONOSCOPY     FOOT SURGERY     had bunyon removed   TONSILLECTOMY     TOOTH EXTRACTION  11/30/2018   TUBAL LIGATION      Family History  Adopted: Yes  Problem Relation Age of Onset   Alcohol abuse Mother    Pancreatic cancer Mother    Liver disease Mother    Breast cancer Maternal Aunt    Healthy Daughter    Stomach cancer Neg Hx    Esophageal cancer Neg Hx     Social History   Socioeconomic History   Marital status: Married    Spouse name: Not on file   Number of children: 1   Years  of education: Not on file   Highest education level: Not on file  Occupational History   Not on file  Tobacco Use   Smoking status: Never   Smokeless tobacco: Never  Vaping Use   Vaping Use: Never used  Substance and Sexual Activity   Alcohol use: No   Drug use: No   Sexual activity: Yes  Other Topics Concern   Not on file  Social History Narrative   Hole between heart and lung-untreatable    Married    orig from  [place]   hh of 2   Pet dog euthanized    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    Neg tad   Some caffiene    Seatbelts no ets fa stored    G1P1   Physically active     5 yo grandaughter    Patient is left handed.      Social Determinants of Health   Financial Resource Strain: Not on file  Food Insecurity: Not on file  Transportation Needs: Not on file  Physical Activity: Not on file  Stress: Not on file  Social  Connections: Not on file    Outpatient Medications Prior to Visit  Medication Sig Dispense Refill   alendronate (FOSAMAX) 70 MG tablet Take 1 tablet (70 mg total) by mouth once a week. Take with a full glass of water on an empty stomach. 12 tablet 3   aspirin 81 MG tablet Take 81 mg by mouth daily.     atorvastatin (LIPITOR) 20 MG tablet TAKE 1 TABLET ONCE DAILY. 90 tablet 2   levothyroxine (SYNTHROID) 75 MCG tablet TAKE 1 TABLET ONCE DAILY BEFORE BREAKFAST 90 tablet 0   MAGNESIUM PO Take by mouth.     Multiple Vitamins-Minerals (WOMENS MULTIVITAMIN PO) Take by mouth daily.     pilocarpine (SALAGEN) 5 MG tablet Take by mouth 2 (two) times daily.     ursodiol (ACTIGALL) 250 MG tablet Take 1 tablet (250 mg total) by mouth 2 (two) times daily. 60 tablet 5   No facility-administered medications prior to visit.     EXAM:  BP (!) 146/90 (BP Location: Left Arm, Patient Position: Sitting, Cuff Size: Normal)   Pulse 63   Temp 98.2 F (36.8 C) (Oral)   Ht 5\' 6"  (1.676 m)   Wt 129 lb 3.2 oz (58.6 kg)   LMP 04/25/2009   SpO2 98%   BMI 20.85 kg/m   Body mass index is 20.85 kg/m. Wt Readings from Last 3 Encounters:  06/12/21 129 lb 3.2 oz (58.6 kg)  04/25/21 127 lb 9.6 oz (57.9 kg)  01/11/21 125 lb 3.2 oz (56.8 kg)    Physical Exam: Vital signs reviewed 01/13/21 is a well-developed well-nourished alert cooperative    who appearsr stated age in no acute distress.  HEENT: normocephalic atraumatic , Eyes: PERRL EOM's full, conjunctiva clear, nonicteric nares: paten,t no deformity discharge or tenderness., Ears: no deformity EAC's clear TMs with normal landmarks. Mouth: Mast NECK: supple without masses, thyromegaly or bruits. CHEST/PULM:  Clear to auscultation and percussion breath sounds equal no wheeze , rales or rhonchi. No chest wall deformities or tenderness. Breast: normal by inspection . No dimpling, discharge, masses, tenderness or discharge .  Implants no obvious masses.  No  adenopathy axilla. CV: PMI is nondisplaced, S1 S2 no gallops, murmurs, rubs. Peripheral pulses are full without delay.No JVD .  ABDOMEN: Bowel sounds  normal nontender  No guard or rebound, no hepato splenomegal no CVA tenderness.  No hernia. Extremtities:  No clubbing cyanosis or edema, no acute joint swelling or redness no focal atrophy partial synovectomy third and fourth toe? NEURO:  Oriented x3, cranial nerves 3-12 appear to be intact, no obvious focal weakness,gait within normal limits no abnormal reflexes or asymmetrical SKIN: No acute rashes normal turgor, color, no bruising or petechiae. PSYCH: Oriented, good eye contact, no obvious depression anxiety, cognition and judgment appear normal. LN: no cervical axillary inguinal adenopathy  Lab Results  Component Value Date   WBC 5.1 06/12/2021   HGB 13.2 06/12/2021   HCT 38.6 06/12/2021   PLT 217.0 06/12/2021   GLUCOSE 81 06/12/2021   CHOL 169 06/12/2021   TRIG 65.0 06/12/2021   HDL 80.40 06/12/2021   LDLCALC 76 06/12/2021   ALT 18 06/12/2021   AST 23 06/12/2021   NA 138 06/12/2021   K 5.2 (H) 06/12/2021   CL 104 06/12/2021   CREATININE 0.91 06/12/2021   BUN 14 06/12/2021   CO2 27 06/12/2021   TSH 4.93 06/12/2021   INR 1.0 02/16/2020   HGBA1C 5.6 06/12/2021    BP Readings from Last 3 Encounters:  06/12/21 (!) 146/90  04/25/21 136/80  01/11/21 (!) 160/99    Lab plan reviewed with patient   ASSESSMENT AND PLAN:  Discussed the following assessment and plan:    ICD-10-CM   1. Visit for preventive health examination  Z00.00 CBC with Differential/Platelet    Basic metabolic panel    Hemoglobin A1c    Hepatic function panel    Lipid panel    TSH    TSH    Lipid panel    Hepatic function panel    Hemoglobin A1c    Basic metabolic panel    CBC with Differential/Platelet    2. Hyperlipidemia, unspecified hyperlipidemia type  E78.5 CBC with Differential/Platelet    Basic metabolic panel    Hemoglobin A1c     Hepatic function panel    Lipid panel    TSH    TSH    Lipid panel    Hepatic function panel    Hemoglobin A1c    Basic metabolic panel    CBC with Differential/Platelet    3. Medication management  Z79.899 CBC with Differential/Platelet    Basic metabolic panel    Hemoglobin A1c    Hepatic function panel    Lipid panel    TSH    TSH    Lipid panel    Hepatic function panel    Hemoglobin A1c    Basic metabolic panel    CBC with Differential/Platelet    4. Primary biliary cholangitis (HCC)  K74.3 CBC with Differential/Platelet    Basic metabolic panel    Hemoglobin A1c    Hepatic function panel    Lipid panel    TSH    TSH    Lipid panel    Hepatic function panel    Hemoglobin A1c    Basic metabolic panel    CBC with Differential/Platelet    5. Osteoporosis without current pathological fracture, unspecified osteoporosis type  M81.0 CBC with Differential/Platelet    Basic metabolic panel    Hemoglobin A1c    Hepatic function panel    Lipid panel    TSH    TSH    Lipid panel    Hepatic function panel    Hemoglobin A1c    Basic metabolic panel    CBC  with Differential/Platelet    6. Elevated blood pressure reading  R03.0 CBC with Differential/Platelet    Basic metabolic panel    Hemoglobin A1c    Hepatic function panel    Lipid panel    TSH    TSH    Lipid panel    Hepatic function panel    Hemoglobin A1c    Basic metabolic panel    CBC with Differential/Platelet    7. Need for shingles vaccine  Z23 Varicella-zoster vaccine IM    Pneumococcal polysaccharide vaccine 23-valent greater than or equal to 2yo subcutaneous/IM    8. Need for 23-polyvalent pneumococcal polysaccharide vaccine  Z23 Varicella-zoster vaccine IM    Pneumococcal polysaccharide vaccine 23-valent greater than or equal to 2yo subcutaneous/IM    Discussed vaccine Shingrix No. 1 Pneumovax 23 Fasting labs today ;exam unremarkable. Blood pressure is elevated in office she will check  readings at home after this visit to ensure control if not may need medication help. Plan fu depending on lab results   Return in about 1 year (around 06/12/2022) for depending on results, shingrix 2 in 2-6 mos.  Patient Care Team: Brionne Mertz, Neta MendsWanda K, MD as PCP - General (Internal Medicine) Olivia Mackieaavon, Richard, MD as Attending Physician (Obstetrics and Gynecology) York SpanielWillis, Charles K, MD as Consulting Physician (Neurology) Pollyann Savoyeveshwar, Shaili, MD as Consulting Physician (Rheumatology) Iva BoopGessner, Carl E, MD as Consulting Physician (Gastroenterology) Patient Instructions  Check BP readings  twice a day for 3-5 days and send in readings   goal is 130/80 average or below .   Exam is good today .  Let me know if breast pain comes back    Your exam is normal .   Second  shingrix vaccine  is due in 2-6 months   can make a nurse appt only or get a pharmacy.   Health Maintenance, Female Adopting a healthy lifestyle and getting preventive care are important in promoting health and wellness. Ask your health care provider about: The right schedule for you to have regular tests and exams. Things you can do on your own to prevent diseases and keep yourself healthy. What should I know about diet, weight, and exercise? Eat a healthy diet  Eat a diet that includes plenty of vegetables, fruits, low-fat dairy products, and lean protein. Do not eat a lot of foods that are high in solid fats, added sugars, or sodium.  Maintain a healthy weight Body mass index (BMI) is used to identify weight problems. It estimates body fat based on height and weight. Your health care provider can help determineyour BMI and help you achieve or maintain a healthy weight. Get regular exercise Get regular exercise. This is one of the most important things you can do for your health. Most adults should: Exercise for at least 150 minutes each week. The exercise should increase your heart rate and make you sweat (moderate-intensity  exercise). Do strengthening exercises at least twice a week. This is in addition to the moderate-intensity exercise. Spend less time sitting. Even light physical activity can be beneficial. Watch cholesterol and blood lipids Have your blood tested for lipids and cholesterol at 67 years of age, then havethis test every 5 years. Have your cholesterol levels checked more often if: Your lipid or cholesterol levels are high. You are older than 67 years of age. You are at high risk for heart disease. What should I know about cancer screening? Depending on your health history and family history, you may need to have cancer screening  at various ages. This may include screening for: Breast cancer. Cervical cancer. Colorectal cancer. Skin cancer. Lung cancer. What should I know about heart disease, diabetes, and high blood pressure? Blood pressure and heart disease High blood pressure causes heart disease and increases the risk of stroke. This is more likely to develop in people who have high blood pressure readings, are of African descent, or are overweight. Have your blood pressure checked: Every 3-5 years if you are 80-55 years of age. Every year if you are 6 years old or older. Diabetes Have regular diabetes screenings. This checks your fasting blood sugar level. Have the screening done: Once every three years after age 75 if you are at a normal weight and have a low risk for diabetes. More often and at a younger age if you are overweight or have a high risk for diabetes. What should I know about preventing infection? Hepatitis B If you have a higher risk for hepatitis B, you should be screened for this virus. Talk with your health care provider to find out if you are at risk forhepatitis B infection. Hepatitis C Testing is recommended for: Everyone born from [birth year] through 1967. Anyone with known risk factors for hepatitis C. Sexually transmitted infections (STIs) Get screened for STIs,  including gonorrhea and chlamydia, if: You are sexually active and are younger than 67 years of age. You are older than 67 years of age and your health care provider tells you that you are at risk for this type of infection. Your sexual activity has changed since you were last screened, and you are at increased risk for chlamydia or gonorrhea. Ask your health care provider if you are at risk. Ask your health care provider about whether you are at high risk for HIV. Your health care provider may recommend a prescription medicine to help prevent HIV infection. If you choose to take medicine to prevent HIV, you should first get tested for HIV. You should then be tested every 3 months for as long as you are taking the medicine. Pregnancy If you are about to stop having your period (premenopausal) and you may become pregnant, seek counseling before you get pregnant. Take 400 to 800 micrograms (mcg) of folic acid every day if you become pregnant. Ask for birth control (contraception) if you want to prevent pregnancy. Osteoporosis and menopause Osteoporosis is a disease in which the bones lose minerals and strength with aging. This can result in bone fractures. If you are 49 years old or older, or if you are at risk for osteoporosis and fractures, ask your health care provider if you should: Be screened for bone loss. Take a calcium or vitamin D supplement to lower your risk of fractures. Be given hormone replacement therapy (HRT) to treat symptoms of menopause. Follow these instructions at home: Lifestyle Do not use any products that contain nicotine or tobacco, such as cigarettes, e-cigarettes, and chewing tobacco. If you need help quitting, ask your health care provider. Do not use street drugs. Do not share needles. Ask your health care provider for help if you need support or information about quitting drugs. Alcohol use Do not drink alcohol if: Your health care provider tells you not to  drink. You are pregnant, may be pregnant, or are planning to become pregnant. If you drink alcohol: Limit how much you use to 0-1 drink a day. Limit intake if you are breastfeeding. Be aware of how much alcohol is in your drink. In the [place] one drink  equals one 12 oz bottle of beer (355 mL), one 5 oz glass of wine (148 mL), or one 1 oz glass of hard liquor (44 mL). General instructions Schedule regular health, dental, and eye exams. Stay current with your vaccines. Tell your health care provider if: You often feel depressed. You have ever been abused or do not feel safe at home. Summary Adopting a healthy lifestyle and getting preventive care are important in promoting health and wellness. Follow your health care provider's instructions about healthy diet, exercising, and getting tested or screened for diseases. Follow your health care provider's instructions on monitoring your cholesterol and blood pressure. This information is not intended to replace advice given to you by your health care provider. Make sure you discuss any questions you have with your healthcare provider. Document Revised: 11/04/2018 Document Reviewed: 11/04/2018 Elsevier Patient Education  2022 [employer].  [place]. Malaney Mcbean M.D.

## 2021-06-12 ENCOUNTER — Encounter: Payer: Self-pay | Admitting: Internal Medicine

## 2021-06-12 ENCOUNTER — Ambulatory Visit (INDEPENDENT_AMBULATORY_CARE_PROVIDER_SITE_OTHER): Payer: BC Managed Care – PPO | Admitting: Internal Medicine

## 2021-06-12 VITALS — BP 146/90 | HR 63 | Temp 98.2°F | Ht 66.0 in | Wt 129.2 lb

## 2021-06-12 DIAGNOSIS — M810 Age-related osteoporosis without current pathological fracture: Secondary | ICD-10-CM

## 2021-06-12 DIAGNOSIS — E039 Hypothyroidism, unspecified: Secondary | ICD-10-CM

## 2021-06-12 DIAGNOSIS — Z23 Encounter for immunization: Secondary | ICD-10-CM

## 2021-06-12 DIAGNOSIS — Z79899 Other long term (current) drug therapy: Secondary | ICD-10-CM | POA: Diagnosis not present

## 2021-06-12 DIAGNOSIS — Z0000 Encounter for general adult medical examination without abnormal findings: Secondary | ICD-10-CM | POA: Diagnosis not present

## 2021-06-12 DIAGNOSIS — R030 Elevated blood-pressure reading, without diagnosis of hypertension: Secondary | ICD-10-CM

## 2021-06-12 DIAGNOSIS — E785 Hyperlipidemia, unspecified: Secondary | ICD-10-CM | POA: Diagnosis not present

## 2021-06-12 DIAGNOSIS — K743 Primary biliary cirrhosis: Secondary | ICD-10-CM | POA: Diagnosis not present

## 2021-06-12 LAB — BASIC METABOLIC PANEL
BUN: 14 mg/dL (ref 6–23)
CO2: 27 mEq/L (ref 19–32)
Calcium: 9.8 mg/dL (ref 8.4–10.5)
Chloride: 104 mEq/L (ref 96–112)
Creatinine, Ser: 0.91 mg/dL (ref 0.40–1.20)
GFR: 65.62 mL/min (ref >60.00)
Glucose, Bld: 81 mg/dL (ref 70–99)
Potassium: 5.2 mEq/L — ABNORMAL HIGH (ref 3.5–5.1)
Sodium: 138 mEq/L (ref 135–145)

## 2021-06-12 LAB — HEPATIC FUNCTION PANEL
ALT: 18 U/L (ref 0–35)
AST: 23 U/L (ref 0–37)
Albumin: 4.4 g/dL (ref 3.5–5.2)
Alkaline Phosphatase: 123 U/L — ABNORMAL HIGH (ref 39–117)
Bilirubin, Direct: 0.1 mg/dL (ref 0.0–0.3)
Total Bilirubin: 0.5 mg/dL (ref 0.2–1.2)
Total Protein: 7.2 g/dL (ref 6.0–8.3)

## 2021-06-12 LAB — CBC WITH DIFFERENTIAL/PLATELET
Basophils Absolute: 0 10*3/uL (ref 0.0–0.1)
Basophils Relative: 1 % (ref 0.0–3.0)
Eosinophils Absolute: 0.2 10*3/uL (ref 0.0–0.7)
Eosinophils Relative: 3.5 % (ref 0.0–5.0)
HCT: 38.6 % (ref 36.0–46.0)
Hemoglobin: 13.2 g/dL (ref 12.0–15.0)
Lymphocytes Relative: 20.2 % (ref 12.0–46.0)
Lymphs Abs: 1 10*3/uL (ref 0.7–4.0)
MCHC: 34.1 g/dL (ref 30.0–36.0)
MCV: 89.2 fl (ref 78.0–100.0)
Monocytes Absolute: 0.5 10*3/uL (ref 0.1–1.0)
Monocytes Relative: 9 % (ref 3.0–12.0)
Neutro Abs: 3.4 10*3/uL (ref 1.4–7.7)
Neutrophils Relative %: 66.3 % (ref 43.0–77.0)
Platelets: 217 10*3/uL (ref 150.0–400.0)
RBC: 4.33 Mil/uL (ref 3.87–5.11)
RDW: 14.1 % (ref 11.5–15.5)
WBC: 5.1 10*3/uL (ref 4.0–10.5)

## 2021-06-12 LAB — LIPID PANEL
Cholesterol: 169 mg/dL (ref 0–200)
HDL: 80.4 mg/dL (ref >39.00)
LDL Cholesterol: 76 mg/dL (ref 0–99)
NonHDL: 88.64
Total CHOL/HDL Ratio: 2
Triglycerides: 65 mg/dL (ref 0.0–149.0)
VLDL: 13 mg/dL (ref 0.0–40.0)

## 2021-06-12 LAB — HEMOGLOBIN A1C: Hgb A1c MFr Bld: 5.6 % (ref 4.6–6.5)

## 2021-06-12 LAB — TSH: TSH: 4.93 u[IU]/mL (ref 0.35–5.50)

## 2021-06-12 NOTE — Patient Instructions (Signed)
Check BP readings  twice a day for 3-5 days and send in readings   goal is 130/80 average or below .   Exam is good today .  Let me know if breast pain comes back    Your exam is normal .   Second  shingrix vaccine  is due in 2-6 months   can make a nurse appt only or get a pharmacy.   Health Maintenance, Female Adopting a healthy lifestyle and getting preventive care are important in promoting health and wellness. Ask your health care provider about: The right schedule for you to have regular tests and exams. Things you can do on your own to prevent diseases and keep yourself healthy. What should I know about diet, weight, and exercise? Eat a healthy diet  Eat a diet that includes plenty of vegetables, fruits, low-fat dairy products, and lean protein. Do not eat a lot of foods that are high in solid fats, added sugars, or sodium.  Maintain a healthy weight Body mass index (BMI) is used to identify weight problems. It estimates body fat based on height and weight. Your health care provider can help determineyour BMI and help you achieve or maintain a healthy weight. Get regular exercise Get regular exercise. This is one of the most important things you can do for your health. Most adults should: Exercise for at least 150 minutes each week. The exercise should increase your heart rate and make you sweat (moderate-intensity exercise). Do strengthening exercises at least twice a week. This is in addition to the moderate-intensity exercise. Spend less time sitting. Even light physical activity can be beneficial. Watch cholesterol and blood lipids Have your blood tested for lipids and cholesterol at 67 years of age, then havethis test every 5 years. Have your cholesterol levels checked more often if: Your lipid or cholesterol levels are high. You are older than 67 years of age. You are at high risk for heart disease. What should I know about cancer screening? Depending on your health  history and family history, you may need to have cancer screening at various ages. This may include screening for: Breast cancer. Cervical cancer. Colorectal cancer. Skin cancer. Lung cancer. What should I know about heart disease, diabetes, and high blood pressure? Blood pressure and heart disease High blood pressure causes heart disease and increases the risk of stroke. This is more likely to develop in people who have high blood pressure readings, are of African descent, or are overweight. Have your blood pressure checked: Every 3-5 years if you are 70-61 years of age. Every year if you are 35 years old or older. Diabetes Have regular diabetes screenings. This checks your fasting blood sugar level. Have the screening done: Once every three years after age 47 if you are at a normal weight and have a low risk for diabetes. More often and at a younger age if you are overweight or have a high risk for diabetes. What should I know about preventing infection? Hepatitis B If you have a higher risk for hepatitis B, you should be screened for this virus. Talk with your health care provider to find out if you are at risk forhepatitis B infection. Hepatitis C Testing is recommended for: Everyone born from [birth year] through 1967. Anyone with known risk factors for hepatitis C. Sexually transmitted infections (STIs) Get screened for STIs, including gonorrhea and chlamydia, if: You are sexually active and are younger than 67 years of age. You are older than 67 years of  age and your health care provider tells you that you are at risk for this type of infection. Your sexual activity has changed since you were last screened, and you are at increased risk for chlamydia or gonorrhea. Ask your health care provider if you are at risk. Ask your health care provider about whether you are at high risk for HIV. Your health care provider may recommend a prescription medicine to help prevent HIV infection. If you  choose to take medicine to prevent HIV, you should first get tested for HIV. You should then be tested every 3 months for as long as you are taking the medicine. Pregnancy If you are about to stop having your period (premenopausal) and you may become pregnant, seek counseling before you get pregnant. Take 400 to 800 micrograms (mcg) of folic acid every day if you become pregnant. Ask for birth control (contraception) if you want to prevent pregnancy. Osteoporosis and menopause Osteoporosis is a disease in which the bones lose minerals and strength with aging. This can result in bone fractures. If you are 53 years old or older, or if you are at risk for osteoporosis and fractures, ask your health care provider if you should: Be screened for bone loss. Take a calcium or vitamin D supplement to lower your risk of fractures. Be given hormone replacement therapy (HRT) to treat symptoms of menopause. Follow these instructions at home: Lifestyle Do not use any products that contain nicotine or tobacco, such as cigarettes, e-cigarettes, and chewing tobacco. If you need help quitting, ask your health care provider. Do not use street drugs. Do not share needles. Ask your health care provider for help if you need support or information about quitting drugs. Alcohol use Do not drink alcohol if: Your health care provider tells you not to drink. You are pregnant, may be pregnant, or are planning to become pregnant. If you drink alcohol: Limit how much you use to 0-1 drink a day. Limit intake if you are breastfeeding. Be aware of how much alcohol is in your drink. In the [place] one drink equals one 12 oz bottle of beer (355 mL), one 5 oz glass of wine (148 mL), or one 1 oz glass of hard liquor (44 mL). General instructions Schedule regular health, dental, and eye exams. Stay current with your vaccines. Tell your health care provider if: You often feel depressed. You have ever been abused or do not feel  safe at home. Summary Adopting a healthy lifestyle and getting preventive care are important in promoting health and wellness. Follow your health care provider's instructions about healthy diet, exercising, and getting tested or screened for diseases. Follow your health care provider's instructions on monitoring your cholesterol and blood pressure. This information is not intended to replace advice given to you by your health care provider. Make sure you discuss any questions you have with your healthcare provider. Document Revised: 11/04/2018 Document Reviewed: 11/04/2018 Elsevier Patient Education  2022 [employer].

## 2021-06-13 MED ORDER — LEVOTHYROXINE SODIUM 88 MCG PO TABS: 88.0000 ug | ORAL_TABLET | Freq: Every day | ORAL | 1 refills | Status: DC

## 2021-06-13 NOTE — Telephone Encounter (Signed)
We could try going up to 88 mcg/day of levothyroxine and check a TSH level in 3 months.  And adjust as indicated.  I sent 90 days of the new dosing to your local pharmacy in [place].  Please arrange TSH and free T4 lab work for 3 months do not have to fast.  Diagnosis hypothyroid.

## 2021-06-13 NOTE — Telephone Encounter (Signed)
Alkaline phosphatase is again barely elevated We will share this information with Dr. Leone Payor  Can give opinion.    Last check was also borderline and was not felt to be clinically significant.

## 2021-06-13 NOTE — Telephone Encounter (Signed)
Lab orders have been placed

## 2021-06-28 ENCOUNTER — Other Ambulatory Visit: Payer: Self-pay | Admitting: Physician Assistant

## 2021-06-28 NOTE — Telephone Encounter (Signed)
Next Visit: No follow-ups on file   Last Visit: 01/11/2021,    Last Fill:04/02/2021   Dx: Sjogren's syndrome with keratoconjunctivitis sicca    Current Dose per office note on 01/11/2021, 5 mg twice daily for symptomatic relief.    Okay to refill Pilocarpine?

## 2021-07-02 ENCOUNTER — Other Ambulatory Visit: Payer: Self-pay | Admitting: Internal Medicine

## 2021-07-09 ENCOUNTER — Other Ambulatory Visit: Payer: Self-pay | Admitting: Internal Medicine

## 2021-08-14 ENCOUNTER — Telehealth: Payer: Self-pay

## 2021-08-14 NOTE — Telephone Encounter (Signed)
Patient left a voicemail requesting prescription refill of Piloc[facility] [address] in [place].

## 2021-08-14 NOTE — Telephone Encounter (Signed)
Patient advised prescription sent to the pharmacy on 06/28/2021 for a 90 day supply. Patient states she was able to get in touch with the pharmacy and they are filling prescription .

## 2021-08-23 LAB — HM MAMMOGRAPHY

## 2021-08-26 ENCOUNTER — Other Ambulatory Visit: Payer: Self-pay | Admitting: Physician Assistant

## 2021-09-03 ENCOUNTER — Encounter: Payer: Self-pay | Admitting: Internal Medicine

## 2021-09-12 ENCOUNTER — Ambulatory Visit (INDEPENDENT_AMBULATORY_CARE_PROVIDER_SITE_OTHER): Payer: BC Managed Care – PPO | Admitting: *Deleted

## 2021-09-12 ENCOUNTER — Other Ambulatory Visit: Payer: Self-pay

## 2021-09-12 ENCOUNTER — Other Ambulatory Visit (INDEPENDENT_AMBULATORY_CARE_PROVIDER_SITE_OTHER): Payer: BC Managed Care – PPO

## 2021-09-12 DIAGNOSIS — E039 Hypothyroidism, unspecified: Secondary | ICD-10-CM | POA: Diagnosis not present

## 2021-09-12 DIAGNOSIS — M810 Age-related osteoporosis without current pathological fracture: Secondary | ICD-10-CM

## 2021-09-12 DIAGNOSIS — Z23 Encounter for immunization: Secondary | ICD-10-CM | POA: Diagnosis not present

## 2021-09-12 DIAGNOSIS — E785 Hyperlipidemia, unspecified: Secondary | ICD-10-CM

## 2021-09-12 LAB — HEPATIC FUNCTION PANEL
ALT: 16 U/L (ref 0–35)
AST: 21 U/L (ref 0–37)
Albumin: 4.1 g/dL (ref 3.5–5.2)
Alkaline Phosphatase: 93 U/L (ref 39–117)
Bilirubin, Direct: 0.1 mg/dL (ref 0.0–0.3)
Total Bilirubin: 0.5 mg/dL (ref 0.2–1.2)
Total Protein: 6.8 g/dL (ref 6.0–8.3)

## 2021-09-12 LAB — VITAMIN D 25 HYDROXY (VIT D DEFICIENCY, FRACTURES): VITD: 29.88 ng/mL — ABNORMAL LOW (ref 30.00–100.00)

## 2021-09-12 LAB — T4, FREE: Free T4: 0.93 ng/dL (ref 0.60–1.60)

## 2021-09-12 LAB — TSH: TSH: 2.4 u[IU]/mL (ref 0.35–5.50)

## 2021-09-14 ENCOUNTER — Ambulatory Visit: Payer: BC Managed Care – PPO | Admitting: Rheumatology

## 2021-09-14 NOTE — Progress Notes (Signed)
Alk phos better now nl  but vit d  borderline low   advise optimize vitamin D intake

## 2021-09-18 ENCOUNTER — Ambulatory Visit: Payer: BC Managed Care – PPO | Admitting: Podiatry

## 2021-09-19 NOTE — Progress Notes (Signed)
Office Visit Note  Patient: Dana [name]             Date of Birth: 1956-06-17           MRN: [id number]             PCP: Burnis Medin, MD Referring: Burnis Medin, MD Visit Date: 10/03/2021 Occupation: @GUAROCC @  Subjective:  Dry mouth and dry eyes   History of Present Illness: Dana [name] is a 65 y.o. female with a history of Sjogren's and osteoarthritis.  She states few weeks ago she fell and landed backwards onto some boxes.  She started having some rib cage pain.  She states the pain is gradually improving.  She continues to have dry mouth and dry eye symptoms.  She has been taking pilocarpine 5 mg twice a day.  She denies any history of oral ulcers, nasal ulcers, malar rash, Raynaud's phenomenon, photosensitivity or lymphadenopathy.  Right trochanteric bursitis resolved.  Activities of Daily Living:  Patient reports morning stiffness for 0 minutes.   Patient Reports nocturnal pain.  Difficulty dressing/grooming: Denies Difficulty climbing stairs: Denies Difficulty getting out of chair: Denies Difficulty using hands for taps, buttons, cutlery, and/or writing: Denies  Review of Systems  Constitutional:  Negative for fatigue.  HENT:  Positive for mouth dryness and nose dryness. Negative for mouth sores.   Eyes:  Positive for dryness. Negative for pain and itching.  Respiratory:  Negative for shortness of breath and difficulty breathing.   Gastrointestinal:  Positive for constipation. Negative for blood in stool and diarrhea.  Endocrine: Negative for increased urination.  Genitourinary:  Negative for difficulty urinating.  Musculoskeletal:  Negative for joint pain, joint pain, joint swelling, myalgias, morning stiffness, muscle tenderness and myalgias.  Skin:  Negative for color change, rash, redness and sensitivity to sunlight.  Allergic/Immunologic: Negative for susceptible to infections.  Neurological:  Positive for numbness. Negative for dizziness, headaches, memory  loss and weakness.  Hematological:  Negative for bruising/bleeding tendency.  Psychiatric/Behavioral:  Negative for depressed mood, confusion and sleep disturbance. The patient is not nervous/anxious.    PMFS History:  Patient Active Problem List   Diagnosis Date Noted   Elevated blood pressure reading without diagnosis of hypertension 02/16/2020   Onychomycosis 08/05/2018   Sjogren's disease ([place]) 07/13/2018   Osteoporosis 07/13/2018   Vitamin D deficiency 07/06/2018   Primary biliary cholangitis ([place]) 04/08/2018   Tetanus, diphtheria, and acellular pertussis (Tdap) vaccination declined 04/08/2018   Trigger point of right shoulder region 03/24/2018   Slipped rib syndrome 03/19/2018   Nonallopathic lesion of rib cage 03/19/2018   Nonallopathic lesion of thoracic region 03/19/2018   Nonallopathic lesion of cervical region 03/19/2018   Low serum vitamin D 04/09/2016   PFO (patent foramen ovale)    Abnormal finding on MRI of brain 01/10/2015   Visit for preventive health examination 11/24/2014   Bilateral arm weakness  hx of  11/23/2014   Essential hypertension 11/23/2014   Left paraspinal back pain 10/13/2013   Chronic constipation 09/16/2013   ESR raised 08/23/2013   Menopausal hot flushes 04/27/2012   Other and unspecified hyperlipidemia 04/27/2012   Migraine with aura    Hyperlipidemia 01/03/2012   Hypothyroidism 03/21/2010   HYPERTENSION, UNSPECIFIED 03/21/2010   CEREBROVASCULAR ACCIDENT, HX OF 03/21/2010    Past Medical History:  Diagnosis Date   Abnormal finding on MRI of brain 01/10/2015   CVA (cerebral infarction) 2010   diplopia neg acute MRi whit matter changes neg LP  Elevated LFTs 11/23/2014   High blood pressure    readings   High cholesterol    Hip pain, acute 10/13/2013   Hx of varicella    Low bone density 07/13/2018   Migraine with aura    auras   Non-cardiac chest pain    hops and evaluation 2011 dr Lia Foyer   Osteoporosis 07/13/2018   Other and  unspecified hyperlipidemia 04/27/2012   Has been on meds for a number of years .  Low dose crestor .  Baseline reported as 280/    PFO (patent foramen ovale)    Moderate size   Pneumonia 2007   and flu hospitalized    Primary biliary cholangitis ([place]) 04/08/2018   Sjogren's disease ([place])    Stroke ([place])    on hrt double vision- possible blood clot on brain; Dr. Floyde Parkins at [facility]    Thyroid disease     Family History  Adopted: Yes  Problem Relation Age of Onset   Alcohol abuse Mother    Pancreatic cancer Mother    Liver disease Mother    Breast cancer Maternal Aunt    Healthy Daughter    Stomach cancer Neg Hx    Esophageal cancer Neg Hx    Past Surgical History:  Procedure Laterality Date   BREAST SURGERY     implants   COLONOSCOPY     FOOT SURGERY     had bunyon removed   TONSILLECTOMY     TOOTH EXTRACTION  11/30/2018   TUBAL LIGATION     Social History   Social History Narrative   Hole between heart and lung-untreatable    Married    orig from  [place]   hh of 2   Pet dog euthanized    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    Neg tad   Some caffiene    Seatbelts no ets fa stored    G1P1   Physically active     29 yo grandaughter    Patient is left handed.      Immunization History  Administered Date(s) Administered   Fluad Quad(high Dose 65+) 09/10/2019, 11/14/2020   Influenza Split 09/08/2012   Influenza,inj,Quad PF,6+ Mos 08/23/2013, 11/16/2014, 09/07/2015, 08/23/2016, 09/02/2017, 09/08/2018   Moderna Sars-Covid-2 Vaccination 07/17/2020, 08/14/2020   Pneumococcal Conjugate-13 04/26/2011, 09/10/2019   Pneumococcal Polysaccharide-23 06/12/2021   Zoster Recombinat (Shingrix) 06/12/2021, 09/12/2021     Objective: Vital Signs: BP (!) 163/91 (BP Location: Right Arm, Patient Position: Sitting, Cuff Size: Normal)   Pulse 64   Ht 5' 6"  (1.676 m)   Wt 131 lb 12.8 oz (59.8 kg)   LMP 04/25/2009    BMI 21.27 kg/m    Physical Exam Vitals and nursing note reviewed.  Constitutional:      Appearance: She is well-developed.  HENT:     Head: Normocephalic and atraumatic.  Eyes:     Conjunctiva/sclera: Conjunctivae normal.  Cardiovascular:     Rate and Rhythm: Normal rate and regular rhythm.     Heart sounds: Normal heart sounds.  Pulmonary:     Effort: Pulmonary effort is normal.     Breath sounds: Normal breath sounds.  Abdominal:     General: Bowel sounds are normal.     Palpations: Abdomen is soft.  Musculoskeletal:     Cervical back: Normal range of motion.  Lymphadenopathy:     Cervical: No cervical adenopathy.  Skin:  General: Skin is warm and dry.     Capillary Refill: Capillary refill takes less than 2 seconds.  Neurological:     Mental Status: She is alert and oriented to person, place, and time.  Psychiatric:        Behavior: Behavior normal.     Musculoskeletal Exam: C-spine thoracic and lumbar spine were in good range of motion.  Shoulder joints, elbow joints, wrist joints, MCPs PIPs and DIPs with good range of motion with no synovitis.  Hip joints, knee joints, ankles, MTPs and PIPs with good range of motion without synovitis.  CDAI Exam: CDAI Score: -- Patient Global: --; Provider Global: -- Swollen: --; Tender: -- Joint Exam 10/03/2021   No joint exam has been documented for this visit   There is currently no information documented on the homunculus. Go to the Rheumatology activity and complete the homunculus joint exam.  Investigation: No additional findings.  Imaging: No results found.  Recent Labs: Lab Results  Component Value Date   WBC 5.1 06/12/2021   HGB 13.2 06/12/2021   PLT 217.0 06/12/2021   NA 138 06/12/2021   K 5.2 (H) 06/12/2021   CL 104 06/12/2021   CO2 27 06/12/2021   GLUCOSE 81 06/12/2021   BUN 14 06/12/2021   CREATININE 0.91 06/12/2021   BILITOT 0.5 09/12/2021   ALKPHOS 93 09/12/2021   AST 21 09/12/2021   ALT 16  09/12/2021   PROT 6.8 09/12/2021   ALBUMIN 4.1 09/12/2021   CALCIUM 9.8 06/12/2021   GFRAA 78 01/11/2021    Speciality Comments: PLQ Eye Exam: 09/25/18 WNL @ [facility] follow up 1 year  Procedures:  No procedures performed Allergies: Estrogens, Codeine, Ibuprofen, and Sulfa antibiotics   Assessment / Plan:     Visit Diagnoses: Sjogren's syndrome with keratoconjunctivitis sicca (HCC) - ANA+, Ro+: She continues to have sicca symptoms.  She has been taking pilocarpine 5 mg p.o. twice daily -she continues to have sicca symptoms which are controlled with over-the-counter medications and pilocarpine.  She had side effects from Plaquenil in the past and was discontinued.  She denies any shortness of breath.  Her CBC was normal in July 2022 and CMP was normal in October 2022.  I will check UA today.  Plan: Urinalysis, Routine w reflex microscopic  High risk medication use - She was started on PLQ in October 2019 but discontinued after 2 weeks due to developing tinnitus and constipation.  Positive ANA (antinuclear antibody) - August 11, 2018 AVISE index 0.2, ANA 1: 5120 homogeneous, SSA positive, dsDNA negative, Smith negative, RNP negative, SSB Negative, SCL 70 is negative: We will repeat autoimmune labs at the follow-up visit.  History of recent fall-patient states she fell backwards and landed in her closet hurting her left chest wall.  She is having some rib cage pain which is gradually resolving.  She had no point tenderness on examination.  Primary biliary cholangitis ([place]) - followed by Dr. Carlean Purl.  Her most recent office visit was on 01/04/2021 at which time Dr. Carlean Purl recommended continuing current dose of ursodeoxycholic acid.  Osteopenia of both hips - DEXA on 07/06/2018 revealed RFN T score of -2.4 and LFN -2.0.  She is currently on alendronate 70 mg p.o. weekly.  DEXA is followed by her PCP.  Vitamin D deficiency-she has vitamin D deficiency.  Vitamin D supplement was  advised.  History of stroke  History of hyperlipidemia  Patent foramen ovale  History of hypothyroidism  Orders: Orders Placed This Encounter  Procedures   Urinalysis, Routine w reflex microscopic    No orders of the defined types were placed in this encounter.    Follow-Up Instructions: Return in about 6 months (around 04/02/2022) for Sjogren's.   Bo Merino, MD  Note - This record has been created using Editor, commissioning.  Chart creation errors have been sought, but may not always  have been located. Such creation errors do not reflect on  the standard of medical care.

## 2021-09-21 ENCOUNTER — Ambulatory Visit: Payer: BC Managed Care – PPO | Admitting: Rheumatology

## 2021-09-24 ENCOUNTER — Ambulatory Visit: Payer: BC Managed Care – PPO | Admitting: Podiatry

## 2021-10-03 ENCOUNTER — Encounter: Payer: Self-pay | Admitting: Rheumatology

## 2021-10-03 ENCOUNTER — Other Ambulatory Visit: Payer: Self-pay

## 2021-10-03 ENCOUNTER — Ambulatory Visit: Payer: BC Managed Care – PPO | Admitting: Rheumatology

## 2021-10-03 VITALS — BP 163/91 | HR 64 | Ht 66.0 in | Wt 131.8 lb

## 2021-10-03 DIAGNOSIS — R768 Other specified abnormal immunological findings in serum: Secondary | ICD-10-CM

## 2021-10-03 DIAGNOSIS — Z8639 Personal history of other endocrine, nutritional and metabolic disease: Secondary | ICD-10-CM

## 2021-10-03 DIAGNOSIS — E559 Vitamin D deficiency, unspecified: Secondary | ICD-10-CM

## 2021-10-03 DIAGNOSIS — Z8673 Personal history of transient ischemic attack (TIA), and cerebral infarction without residual deficits: Secondary | ICD-10-CM

## 2021-10-03 DIAGNOSIS — Z9181 History of falling: Secondary | ICD-10-CM

## 2021-10-03 DIAGNOSIS — K743 Primary biliary cirrhosis: Secondary | ICD-10-CM | POA: Diagnosis not present

## 2021-10-03 DIAGNOSIS — M3501 Sicca syndrome with keratoconjunctivitis: Secondary | ICD-10-CM | POA: Diagnosis not present

## 2021-10-03 DIAGNOSIS — Z79899 Other long term (current) drug therapy: Secondary | ICD-10-CM | POA: Diagnosis not present

## 2021-10-03 DIAGNOSIS — M85851 Other specified disorders of bone density and structure, right thigh: Secondary | ICD-10-CM

## 2021-10-03 DIAGNOSIS — M85852 Other specified disorders of bone density and structure, left thigh: Secondary | ICD-10-CM

## 2021-10-03 DIAGNOSIS — Q2112 Patent foramen ovale: Secondary | ICD-10-CM

## 2021-10-04 ENCOUNTER — Encounter: Payer: Self-pay | Admitting: Rheumatology

## 2021-10-04 LAB — URINALYSIS, ROUTINE W REFLEX MICROSCOPIC
Bacteria, UA: NONE SEEN /HPF
Bilirubin Urine: NEGATIVE
Glucose, UA: NEGATIVE
Hgb urine dipstick: NEGATIVE
Hyaline Cast: NONE SEEN /LPF
Ketones, ur: NEGATIVE
Nitrite: NEGATIVE
Protein, ur: NEGATIVE
RBC / HPF: NONE SEEN /HPF (ref 0–2)
Specific Gravity, Urine: 1.007 (ref 1.001–1.035)
Squamous Epithelial / HPF: NONE SEEN /HPF (ref <=5)
pH: 6 (ref 5.0–8.0)

## 2021-10-04 LAB — MICROSCOPIC MESSAGE

## 2021-10-04 NOTE — Progress Notes (Signed)
UA is negative

## 2021-10-04 NOTE — Telephone Encounter (Signed)
Patient spoke with Jasmine December C regarding lab results. Copy sent to PCP.

## 2021-10-08 ENCOUNTER — Ambulatory Visit (INDEPENDENT_AMBULATORY_CARE_PROVIDER_SITE_OTHER): Payer: BC Managed Care – PPO

## 2021-10-08 ENCOUNTER — Ambulatory Visit: Payer: BC Managed Care – PPO | Admitting: Podiatry

## 2021-10-08 ENCOUNTER — Other Ambulatory Visit: Payer: Self-pay

## 2021-10-08 DIAGNOSIS — G5761 Lesion of plantar nerve, right lower limb: Secondary | ICD-10-CM

## 2021-10-08 DIAGNOSIS — M79671 Pain in right foot: Secondary | ICD-10-CM

## 2021-10-08 DIAGNOSIS — L603 Nail dystrophy: Secondary | ICD-10-CM

## 2021-10-08 NOTE — Progress Notes (Signed)
Subjective: 65 year old female presents the office with concerns of toenail discoloration to both of her big toes.  She said that she had gel nail polish on them and when it was Off there was a white discoloration to the distal portion of the nail.  She has no pain associate the nail any swelling or redness or any drainage.  She is interested in possible laser treatment.  Also she began some numbness to her right foot and it seems to be the ball, lateral aspect of the foot.  Does not radiate.  She has no symptoms of her left side.  This been ongoing for last several months.  No injury that she reports.  Objective: AAO x3, NAD DP/PT pulses palpable bilaterally, CRT less than 3 seconds Bilateral hallux nails have white discoloration of the distal one third of the nail.  There is no pain in the nail there is no redness or drainage or any signs of infection.  No significant thickening of the nail present. Not able to elicit any area of pinpoint tenderness on the right foot.  There is a small palpable neuroma noted on the third interspace which could represent a neuroma versus bursitis.  Flexor, extensor tendons appear to be intact.  No Tinel sign. No pain with calf compression, swelling, warmth, erythema  Assessment: Onychodystrophy bilateral hallux; likely neuroma right foot  Plan: -All treatment options discussed with the patient including all alternatives, risks, complications.  -X-rays today reviewed of the right foot there is no evidence of acute fracture. -I added a neuroma pad to her shoes and gave her some to put inside of her inserts that she has.  She can use topical medication as well.  If needed consider injection therapy -With hallux nails we discussed possible laser however upon debriding the nails the white discoloration resolved and the nails look much better.  Likely the white discoloration was coming from the bonding agent.  However if it were to recur we will proceed with laser  therapy. -Patient encouraged to call the office with any questions, concerns, change in symptoms.   Vivi Barrack DPM

## 2021-11-15 ENCOUNTER — Other Ambulatory Visit: Payer: Self-pay | Admitting: Internal Medicine

## 2021-11-15 ENCOUNTER — Other Ambulatory Visit: Payer: Self-pay | Admitting: Physician Assistant

## 2021-11-16 ENCOUNTER — Telehealth: Payer: Self-pay | Admitting: Rheumatology

## 2021-11-16 NOTE — Telephone Encounter (Signed)
Next Visit: due 03/2022. Message sent to the front desk to schedule.   Last Visit: 10/03/2021  Last Fill: 06/28/2021  DX: Sjogren's syndrome with keratoconjunctivitis sicca   Current Dose per office note on 10/03/2021: pilocarpine 5 mg p.o. twice daily   Okay to refill pilocarpine?

## 2021-11-16 NOTE — Telephone Encounter (Signed)
Refill request received from the pharmacy as well. Has been forwarded to Dr. Corliss Skains for review.

## 2021-11-16 NOTE — Telephone Encounter (Signed)
Patient requests a refill of Salagen 5 mg [facility] [place], [address]

## 2022-01-16 ENCOUNTER — Ambulatory Visit: Payer: BC Managed Care – PPO | Admitting: Internal Medicine

## 2022-01-16 ENCOUNTER — Encounter: Payer: Self-pay | Admitting: Internal Medicine

## 2022-01-16 ENCOUNTER — Other Ambulatory Visit (INDEPENDENT_AMBULATORY_CARE_PROVIDER_SITE_OTHER): Payer: BC Managed Care – PPO

## 2022-01-16 VITALS — BP 150/90 | HR 68 | Ht 66.0 in | Wt 125.0 lb

## 2022-01-16 DIAGNOSIS — K743 Primary biliary cirrhosis: Secondary | ICD-10-CM

## 2022-01-16 DIAGNOSIS — E559 Vitamin D deficiency, unspecified: Secondary | ICD-10-CM

## 2022-01-16 LAB — COMPREHENSIVE METABOLIC PANEL
ALT: 18 U/L (ref 0–35)
AST: 22 U/L (ref 0–37)
Albumin: 4.4 g/dL (ref 3.5–5.2)
Alkaline Phosphatase: 104 U/L (ref 39–117)
BUN: 12 mg/dL (ref 6–23)
CO2: 29 mEq/L (ref 19–32)
Calcium: 10.1 mg/dL (ref 8.4–10.5)
Chloride: 104 mEq/L (ref 96–112)
Creatinine, Ser: 0.85 mg/dL (ref 0.40–1.20)
GFR: 70.91 mL/min (ref >60.00)
Glucose, Bld: 81 mg/dL (ref 70–99)
Potassium: 4 mEq/L (ref 3.5–5.1)
Sodium: 137 mEq/L (ref 135–145)
Total Bilirubin: 0.7 mg/dL (ref 0.2–1.2)
Total Protein: 7.6 g/dL (ref 6.0–8.3)

## 2022-01-16 LAB — CBC WITH DIFFERENTIAL/PLATELET
Basophils Absolute: 0.1 10*3/uL (ref 0.0–0.1)
Basophils Relative: 1.2 % (ref 0.0–3.0)
Eosinophils Absolute: 0.1 10*3/uL (ref 0.0–0.7)
Eosinophils Relative: 2.5 % (ref 0.0–5.0)
HCT: 37.1 % (ref 36.0–46.0)
Hemoglobin: 12.2 g/dL (ref 12.0–15.0)
Lymphocytes Relative: 25.2 % (ref 12.0–46.0)
Lymphs Abs: 1.2 10*3/uL (ref 0.7–4.0)
MCHC: 32.9 g/dL (ref 30.0–36.0)
MCV: 91.5 fl (ref 78.0–100.0)
Monocytes Absolute: 0.5 10*3/uL (ref 0.1–1.0)
Monocytes Relative: 9.9 % (ref 3.0–12.0)
Neutro Abs: 2.8 10*3/uL (ref 1.4–7.7)
Neutrophils Relative %: 61.2 % (ref 43.0–77.0)
Platelets: 204 10*3/uL (ref 150.0–400.0)
RBC: 4.06 Mil/uL (ref 3.87–5.11)
RDW: 14 % (ref 11.5–15.5)
WBC: 4.6 10*3/uL (ref 4.0–10.5)

## 2022-01-16 LAB — VITAMIN D 25 HYDROXY (VIT D DEFICIENCY, FRACTURES): VITD: 47.09 ng/mL (ref 30.00–100.00)

## 2022-01-16 NOTE — Progress Notes (Signed)
Dana [name] 66 y.o. July 08, 1956 [id number]  Assessment & Plan:   Encounter Diagnoses  Name Primary?   Primary biliary cholangitis (HCC) Yes   Vitamin D deficiency    She will see dermatology about the rash.  I do not know if it is related to her autoimmune problems or not it seems minor but it is bothering her and I agree that it should be evaluated.  Reassess her liver disease as below.  She is at increased risk of fat-soluble vitamin malabsorption so I am checking DA and E. I overlooked ordering an INR but that has been okay over the years.  We could consider doing that later depending upon what we find on these tests.  Follow-up to be arranged pending the above. Orders Placed This Encounter  Procedures   US Abdomen Limited RUQ (LIVER/GB)   CBC w/Diff   Comp Met (CMET)   Vitamin D (25 hydroxy)   Vitamin A   Vitamin E     CC: Panosh, Standley Brooking, MD   Subjective:   Chief Complaint: Follow-up of PBC  HPI Dana [name] is a 66 year old woman I follow with primary biliary cholangitis and a history of chronic constipation.  She is here for routine follow-up of her PBC.  She is doing okay, her vitamin D level was 29 in the fall and she has increased her vitamin D to 4000 units daily.  She is actually not sure if that is an increase but she had had some sort of facial rash with a previous vitamin D supplement and so she is now on Gummies.  She has had these tiny itchy red bumps or papules and scattered areas of the body for unclear reasons and she is awaiting a dermatology evaluation.  She has had some fluctuating alk phos levels but most recently normal.  She also has Sjogren's.  Followed by Dr. Patrecia Pour.  The patient is wondering how we know her liver is okay and how we are following things. Allergies  Allergen Reactions   Estrogens Other (See Comments)    Had stroke on HRT/OCP   Codeine     REACTION: nausea   Ibuprofen     REACTION: nause   Sulfa Antibiotics Itching    Pt reported  on 05/03/2020   Current Meds  Medication Sig   alendronate (FOSAMAX) 70 MG tablet Take 1 tablet (70 mg total) by mouth once a week. Take with a full glass of water on an empty stomach.   aspirin 81 MG tablet Take 81 mg by mouth daily.   atorvastatin (LIPITOR) 20 MG tablet TAKE 1 TABLET BY MOUTH ONCE DAILY   levothyroxine (SYNTHROID) 88 MCG tablet TAKE 1 TABLET (88 MCG TOTAL) BY MOUTH DAILY. DOSAGE CHANGE   MAGNESIUM PO Take by mouth.   Multiple Vitamins-Minerals (WOMENS MULTIVITAMIN PO) Take by mouth daily.   pilocarpine (SALAGEN) 5 MG tablet TAKE 1 TABLET BY MOUTH TWICE A DAY   ursodiol (ACTIGALL) 250 MG tablet TAKE 1 TABLET BY MOUTH TWICE A DAY   Past Medical History:  Diagnosis Date   Abnormal finding on MRI of brain 01/10/2015   CVA (cerebral infarction) 2010   diplopia neg acute MRi whit matter changes neg LP    Elevated LFTs 11/23/2014   High blood pressure    readings   High cholesterol    Hip pain, acute 10/13/2013   Hx of varicella    Low bone density 07/13/2018   Migraine with aura    auras  Non-cardiac chest pain    hops and evaluation 2011 dr Lia Foyer   Osteoporosis 07/13/2018   Other and unspecified hyperlipidemia 04/27/2012   Has been on meds for a number of years .  Low dose crestor .  Baseline reported as 280/    PFO (patent foramen ovale)    Moderate size   Pneumonia 2007   and flu hospitalized    Primary biliary cholangitis ([place]) 04/08/2018   Sjogren's disease ([place])    Stroke ([place])    on hrt double vision- possible blood clot on brain; Dr. 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    Neg tad   Some caffiene    Seatbelts no ets fa stored    G1P1   Physically active     4 yo grandaughter    Patient is left handed.      family history includes Alcohol abuse in her mother; Breast cancer in her maternal aunt; Healthy in her daughter; Liver disease in her mother; Pancreatic cancer in her mother. She was adopted.   Review of Systems As per HPI Has some sicca symptoms followed and treated by rheumatology Objective:   Physical Exam BP (!) 150/90    Pulse 68    Ht _0  (1.676 m)    Wt 125 lb (56.7 kg)    LMP 04/25/2009    SpO2 98%    BMI 20.18 kg/m  Thn [employer] NAD Anicteric Lungs cta Cor NL S1S2 no rmg Abd thin soft NT no HSM/mass Skin - a few tiny erythematous papules various locations  2022  rheumatology and primary care visits/labs reviewed.

## 2022-01-16 NOTE — Patient Instructions (Signed)
Your provider has requested that you go to the basement level for lab work before leaving today. Press "B" on the elevator. The lab is located at the first door on the left as you exit the elevator.   You have been scheduled for an abdominal ultrasound at [facility] (1st floor of hospital) on 01/23/22 at 10:00am. Please arrive 15 minutes prior to your appointment for registration. Make certain not to have anything to eat or drink 6 hours prior to your appointment. Should you need to reschedule your appointment, please contact radiology at [phone]. This test typically takes about 30 minutes to perform.  If you are age 34 or older, your body mass index should be between 23-30. Your Body mass index is 20.18 kg/m. If this is out of the aforementioned range listed, please consider follow up with your Primary Care Provider.  The [facility] providers would like to encourage you to use [facility] to communicate with providers for non-urgent requests or questions.  Due to long hold times on the telephone, sending your provider a message by [facility] may be a faster and more efficient way to get a response.  Please allow 48 business hours for a response.  Please remember that this is for non-urgent requests.  _______________________________________________________  Thank you for choosing me and [facility].  Dr.Carl Leone Payor

## 2022-01-19 LAB — VITAMIN E
Gamma-Tocopherol (Vit E): 2.8 mg/L (ref <=4.3)
Vitamin E (Alpha Tocopherol): 11.9 mg/L (ref 5.7–19.9)

## 2022-01-19 LAB — VITAMIN A: Vitamin A (Retinoic Acid): 49 ug/dL (ref 38–98)

## 2022-01-23 ENCOUNTER — Other Ambulatory Visit: Payer: Self-pay

## 2022-01-23 ENCOUNTER — Ambulatory Visit (HOSPITAL_COMMUNITY)
Admission: RE | Admit: 2022-01-23 | Discharge: 2022-01-23 | Disposition: A | Discharge status: Home / Self Care | Payer: BC Managed Care – PPO | Source: Ambulatory Visit | Attending: Internal Medicine | Admitting: Internal Medicine

## 2022-01-23 DIAGNOSIS — K743 Primary biliary cirrhosis: Secondary | ICD-10-CM

## 2022-01-23 DIAGNOSIS — E559 Vitamin D deficiency, unspecified: Secondary | ICD-10-CM | POA: Diagnosis present

## 2022-01-25 ENCOUNTER — Encounter: Payer: Self-pay | Admitting: Internal Medicine

## 2022-01-25 DIAGNOSIS — R932 Abnormal findings on diagnostic imaging of liver and biliary tract: Secondary | ICD-10-CM | POA: Insufficient documentation

## 2022-02-01 ENCOUNTER — Encounter: Payer: Self-pay | Admitting: Internal Medicine

## 2022-02-05 ENCOUNTER — Encounter: Payer: Self-pay | Admitting: Internal Medicine

## 2022-02-05 NOTE — Telephone Encounter (Signed)
I called and explained likely not a problem - worst case scenario would be gallbladder cancer developing - no suggestion of that now but we should recheck in 6 mos ? ?She accepts the plan  ?

## 2022-02-06 ENCOUNTER — Encounter: Payer: Self-pay | Admitting: Internal Medicine

## 2022-02-07 ENCOUNTER — Other Ambulatory Visit: Payer: Self-pay

## 2022-02-07 NOTE — Telephone Encounter (Signed)
Spoke to patient ?We will do a repeat US at 3 months instead of 6 ? ?Please schedule that, Remo Lipps - if possible vs reminder ? ?Dx abnormal gallbladder on [place] ?

## 2022-02-16 ENCOUNTER — Other Ambulatory Visit: Payer: Self-pay | Admitting: Rheumatology

## 2022-02-18 NOTE — Telephone Encounter (Signed)
Next Visit: 03/27/2022 ?  ?Last Visit: 10/03/2021 ?  ?Last Fill:11/16/2021 ?  ?DX: Sjogren's syndrome with keratoconjunctivitis sicca  ?  ?Current Dose per office note on 10/03/2021: pilocarpine 5 mg p.o. twice daily  ?  ?Okay to refill pilocarpine?  ?

## 2022-02-27 ENCOUNTER — Other Ambulatory Visit: Payer: Self-pay

## 2022-02-27 ENCOUNTER — Encounter (HOSPITAL_BASED_OUTPATIENT_CLINIC_OR_DEPARTMENT_OTHER): Payer: Self-pay | Admitting: Emergency Medicine

## 2022-02-27 ENCOUNTER — Emergency Department (HOSPITAL_BASED_OUTPATIENT_CLINIC_OR_DEPARTMENT_OTHER)
Admission: EM | Admit: 2022-02-27 | Discharge: 2022-02-27 | Disposition: A | Discharge status: Home / Self Care | Payer: BC Managed Care – PPO | Attending: Emergency Medicine | Admitting: Emergency Medicine

## 2022-02-27 ENCOUNTER — Emergency Department (HOSPITAL_BASED_OUTPATIENT_CLINIC_OR_DEPARTMENT_OTHER): Payer: BC Managed Care – PPO | Admitting: Radiology

## 2022-02-27 DIAGNOSIS — Z7982 Long term (current) use of aspirin: Secondary | ICD-10-CM | POA: Insufficient documentation

## 2022-02-27 DIAGNOSIS — J069 Acute upper respiratory infection, unspecified: Secondary | ICD-10-CM | POA: Diagnosis not present

## 2022-02-27 DIAGNOSIS — J189 Pneumonia, unspecified organism: Secondary | ICD-10-CM | POA: Diagnosis present

## 2022-02-27 DIAGNOSIS — Z20822 Contact with and (suspected) exposure to covid-19: Secondary | ICD-10-CM | POA: Insufficient documentation

## 2022-02-27 LAB — CBC WITH DIFFERENTIAL/PLATELET
Abs Immature Granulocytes: 0.01 10*3/uL (ref 0.00–0.07)
Basophils Absolute: 0 10*3/uL (ref 0.0–0.1)
Basophils Relative: 1 %
Eosinophils Absolute: 0 10*3/uL (ref 0.0–0.5)
Eosinophils Relative: 0 %
HCT: 36.2 % (ref 36.0–46.0)
Hemoglobin: 11.9 g/dL — ABNORMAL LOW (ref 12.0–15.0)
Immature Granulocytes: 0 %
Lymphocytes Relative: 15 %
Lymphs Abs: 0.5 10*3/uL — ABNORMAL LOW (ref 0.7–4.0)
MCH: 29.8 pg (ref 26.0–34.0)
MCHC: 32.9 g/dL (ref 30.0–36.0)
MCV: 90.7 fL (ref 80.0–100.0)
Monocytes Absolute: 0.2 10*3/uL (ref 0.1–1.0)
Monocytes Relative: 6 %
Neutro Abs: 2.4 10*3/uL (ref 1.7–7.7)
Neutrophils Relative %: 78 %
Platelets: 108 10*3/uL — ABNORMAL LOW (ref 150–400)
RBC: 3.99 MIL/uL (ref 3.87–5.11)
RDW: 13.8 % (ref 11.5–15.5)
WBC: 3.1 10*3/uL — ABNORMAL LOW (ref 4.0–10.5)
nRBC: 0 % (ref 0.0–0.2)

## 2022-02-27 LAB — RESP PANEL BY RT-PCR (FLU A&B, COVID) ARPGX2
Influenza A by PCR: NEGATIVE
Influenza B by PCR: NEGATIVE
SARS Coronavirus 2 by RT PCR: NEGATIVE

## 2022-02-27 LAB — COMPREHENSIVE METABOLIC PANEL
ALT: 57 U/L — ABNORMAL HIGH (ref 0–44)
AST: 69 U/L — ABNORMAL HIGH (ref 15–41)
Albumin: 4.1 g/dL (ref 3.5–5.0)
Alkaline Phosphatase: 94 U/L (ref 38–126)
Anion gap: 12 (ref 5–15)
BUN: 14 mg/dL (ref 8–23)
CO2: 20 mmol/L — ABNORMAL LOW (ref 22–32)
Calcium: 9.4 mg/dL (ref 8.9–10.3)
Chloride: 103 mmol/L (ref 98–111)
Creatinine, Ser: 0.93 mg/dL (ref 0.44–1.00)
GFR, Estimated: >60 mL/min (ref >60)
Glucose, Bld: 94 mg/dL (ref 70–99)
Potassium: 3.8 mmol/L (ref 3.5–5.1)
Sodium: 135 mmol/L (ref 135–145)
Total Bilirubin: 0.8 mg/dL (ref 0.3–1.2)
Total Protein: 7.2 g/dL (ref 6.5–8.1)

## 2022-02-27 LAB — TROPONIN I (HIGH SENSITIVITY): Troponin I (High Sensitivity): 4 ng/L (ref <18)

## 2022-02-27 MED ORDER — BENZONATATE 100 MG PO CAPS: 100.0000 mg | ORAL_CAPSULE | Freq: Three times a day (TID) | ORAL | 0 refills | Status: DC

## 2022-02-27 NOTE — ED Provider Notes (Signed)
?[facility] EMERGENCY DEPT ?Provider Note ? ? ?CSN: [id number] ?Arrival date & time: 02/27/22  2025 ? ?  ? ?History ? ?Chief Complaint  ?Patient presents with  ? Pneumonia  ? ? ?Dana [name] is a 67 y.o. female. ? ?Patient presents ER chief complaint of fevers and cough ongoing for 3 days.  She took a COVID test at home which have been negative.  She has persistent symptoms and presents to the ER.  She states she has a history of pneumonia in the past and is concerned this may be similar.  Denies any vomiting or diarrhea.  Denies any chest pain. ? ? ?  ? ?Home Medications ?Prior to Admission medications   ?Medication Sig Start Date End Date Taking? Authorizing Provider  ?benzonatate (TESSALON) 100 MG capsule Take 1 capsule (100 mg total) by mouth every 8 (eight) hours. 02/27/22  Yes Cheryll Cockayne, MD  ?alendronate (FOSAMAX) 70 MG tablet Take 1 tablet (70 mg total) by mouth once a week. Take with a full glass of water on an empty stomach. 04/25/21   Panosh, Neta Mends, MD  ?aspirin 81 MG tablet Take 81 mg by mouth daily.    [provider]  ?atorvastatin (LIPITOR) 20 MG tablet TAKE 1 TABLET BY MOUTH ONCE DAILY 07/02/21   Panosh, Neta Mends, MD  ?levothyroxine (SYNTHROID) 88 MCG tablet TAKE 1 TABLET (88 MCG TOTAL) BY MOUTH DAILY. DOSAGE CHANGE 11/20/21   Panosh, Neta Mends, MD  ?MAGNESIUM PO Take by mouth.    [provider]  ?Multiple Vitamins-Minerals (WOMENS MULTIVITAMIN PO) Take by mouth daily.    [provider]  ?pilocarpine (SALAGEN) 5 MG tablet TAKE 1 TABLET BY MOUTH TWICE A DAY 02/18/22   Gearldine Bienenstock, PA-C  ?ursodiol (ACTIGALL) 250 MG tablet TAKE 1 TABLET BY MOUTH TWICE A DAY 11/16/21   Iva Boop, MD  ?   ? ?Allergies    ?Estrogens, Codeine, Ibuprofen, and Sulfa antibiotics   ? ?Review of Systems   ?Review of Systems  ?Constitutional:  Positive for fever.  ?HENT:  Negative for ear pain.   ?Eyes:  Negative for pain.  ?Respiratory:  Positive for cough.   ?Cardiovascular:   Negative for chest pain.  ?Gastrointestinal:  Negative for abdominal pain.  ?Genitourinary:  Negative for flank pain.  ?Musculoskeletal:  Negative for back pain.  ?Skin:  Negative for rash.  ?Neurological:  Negative for headaches.  ? ?Physical Exam ?Updated Vital Signs ?BP (!) 151/94   Pulse 71   Temp 98.5 ?F (36.9 ?C) (Oral)   Resp 18   Ht 5\' 5"  (1.651 m)   Wt 55.3 kg   LMP 04/25/2009   SpO2 96%   BMI 20.30 kg/m?  ?Physical Exam ?Constitutional:   ?   General: She is not in acute distress. ?   Appearance: Normal appearance.  ?HENT:  ?   Head: Normocephalic.  ?   Nose: Nose normal.  ?Eyes:  ?   Extraocular Movements: Extraocular movements intact.  ?Cardiovascular:  ?   Rate and Rhythm: Normal rate.  ?Pulmonary:  ?   Effort: Pulmonary effort is normal.  ?Abdominal:  ?   Tenderness: There is no abdominal tenderness. There is no guarding or rebound.  ?Musculoskeletal:     ?   General: Normal range of motion.  ?   Cervical back: Normal range of motion.  ?Neurological:  ?   General: No focal deficit present.  ?   Mental Status: She is alert. Mental  status is at baseline.  ? ? ?ED Results / Procedures / Treatments   ?Labs ?(all labs ordered are listed, but only abnormal results are displayed) ?Labs Reviewed  ?COMPREHENSIVE METABOLIC PANEL - Abnormal; Notable for the following components:  ?    Result Value  ? CO2 20 (*)   ? AST 69 (*)   ? ALT 57 (*)   ? All other components within normal limits  ?CBC WITH DIFFERENTIAL/PLATELET - Abnormal; Notable for the following components:  ? WBC 3.1 (*)   ? Hemoglobin 11.9 (*)   ? Platelets 108 (*)   ? Lymphs Abs 0.5 (*)   ? All other components within normal limits  ?RESP PANEL BY RT-PCR (FLU A&B, COVID) [id number]  ?TROPONIN I (HIGH SENSITIVITY)  ? ? ?EKG ?None ? ?Radiology ?DG Chest 2 View ? ?Result Date: 02/27/2022 ?CLINICAL DATA:  Possible pneumonia EXAM: CHEST - 2 VIEW COMPARISON:  03/09/2018 FINDINGS: The heart size and mediastinal contours are within normal limits. Both  lungs are clear. The visualized skeletal structures are unremarkable. IMPRESSION: No active cardiopulmonary disease. Electronically Signed   By: Deatra Robinson M.D.   On: 02/27/2022 21:12   ? ?Procedures ?Procedures  ? ? ?Medications Ordered in ED ?Medications - No data to display ? ?ED Course/ Medical Decision Making/ A&P ?  ?                        ?Medical Decision Making ?Amount and/or Complexity of Data Reviewed ?Labs: ordered. ?Radiology: ordered. ? ? ?Review of record shows office visit in outpatient setting January 16, 2022 for biliary cholangitis. ? ?Cardiac monitoring shows sinus rhythm. ? ?Work-up today included labs White count normal at 3 hemoglobin 12 chemistry unremarkable.  Troponin is negative.  COVID panel again sent is negative. ? ?Chest x-ray shows no evidence of pneumonia per radiology. ? ?Patient's vital signs remained stable with 97% on room air which is appropriate. ? ?Recommend outpatient follow-up with her doctor within 2 or 3 days, recommending immediate return for worsening symptoms difficulty breathing or any additional concerns. ? ? ? ? ? ? ? ?Final Clinical Impression(s) / ED Diagnoses ?Final diagnoses:  ?Upper respiratory tract infection, unspecified type  ? ? ?Rx / DC Orders ?ED Discharge Orders   ? ?      Ordered  ?  benzonatate (TESSALON) 100 MG capsule  Every 8 hours       ? 02/27/22 2305  ? ?  ?  ? ?  ? ? ?  ?Cheryll Cockayne, MD ?02/27/22 2305 ? ?

## 2022-02-27 NOTE — Discharge Instructions (Signed)
Call your primary care doctor or specialist as discussed in the next 2-3 days.   Return immediately back to the ER if:  Your symptoms worsen within the next 12-24 hours. You develop new symptoms such as new fevers, persistent vomiting, new pain, shortness of breath, or new weakness or numbness, or if you have any other concerns.  

## 2022-02-27 NOTE — ED Notes (Signed)
Patient ambulatory to bathroom.

## 2022-02-27 NOTE — ED Triage Notes (Signed)
Patient reports fever/cough x 3 days.  Patient reports negative flu/covid tests.  Patient also reports jaw/teeth pain. Patient has a history of pneumonia.  ?

## 2022-03-04 ENCOUNTER — Encounter: Payer: Self-pay | Admitting: Family Medicine

## 2022-03-04 ENCOUNTER — Ambulatory Visit: Payer: BC Managed Care – PPO | Admitting: Family Medicine

## 2022-03-04 VITALS — BP 156/82 | HR 79 | Temp 98.4°F | Ht 65.0 in | Wt 127.2 lb

## 2022-03-04 DIAGNOSIS — M25431 Effusion, right wrist: Secondary | ICD-10-CM

## 2022-03-04 DIAGNOSIS — M2550 Pain in unspecified joint: Secondary | ICD-10-CM

## 2022-03-04 DIAGNOSIS — M25471 Effusion, right ankle: Secondary | ICD-10-CM

## 2022-03-04 DIAGNOSIS — M25432 Effusion, left wrist: Secondary | ICD-10-CM

## 2022-03-04 DIAGNOSIS — M25472 Effusion, left ankle: Secondary | ICD-10-CM

## 2022-03-04 DIAGNOSIS — M3500 Sicca syndrome, unspecified: Secondary | ICD-10-CM

## 2022-03-04 DIAGNOSIS — I10 Essential (primary) hypertension: Secondary | ICD-10-CM | POA: Diagnosis not present

## 2022-03-04 DIAGNOSIS — M2560 Stiffness of unspecified joint, not elsewhere classified: Secondary | ICD-10-CM | POA: Diagnosis not present

## 2022-03-04 MED ORDER — BENAZEPRIL HCL 5 MG PO TABS: 5.0000 mg | ORAL_TABLET | Freq: Every day | ORAL | 1 refills | Status: DC

## 2022-03-04 NOTE — Progress Notes (Signed)
Subjective:  ? ? Patient ID: Dana [name], female    DOB: [birth year], 66 y.o.   MRN: [id number] ? ?Chief Complaint  ?Patient presents with  ? Joint Pain  ?  Patient complains of joint pain, x2 days  ? Edema  ? ? ?HPI ?Patient is a 66 yo female with pmh sig for h/o CVA, PSC, HLD, elevated bp readings, migraines, Osteoporosis,  Sjogren's syndrome who is followed by Dr. Fabian Sharp and seen today for acute concern.  Pt states he has been feeling bad x 3-4 wks.  Endorses being seen at [facility] for low grade fever, HA, L jaw pain, and achiness on 02/27/22. Dx'd with URI. ? ?Pt states she has continued to feel off.  Started having L knee edema and pain when going down the stairs 3 days ago.  Felt like "a golf ball" behind her knee.  The next day pt had b/l ankle edema.  This am pt with b/l wrist/hand edema and stiffness.  Trying to avoid Tylenol 2/2 history of PSC.  Patient does not like taking NSAIDs.  Tried OTC Voltaren gel.  Pt denies injury or increased sodium intake.  Followed by rheumatology, Dr. Corliss Skains for for history of Sjogren's syndrome unsure of family h/o autoimmune d/os as adopted. ? ?Past Medical History:  ?Diagnosis Date  ? Abnormal finding on MRI of brain 01/10/2015  ? CVA (cerebral infarction) 2010  ? diplopia neg acute MRi whit matter changes neg LP   ? Elevated LFTs 11/23/2014  ? High blood pressure   ? readings  ? High cholesterol   ? Hip pain, acute 10/13/2013  ? Hx of varicella   ? Low bone density 07/13/2018  ? Migraine with aura   ? auras  ? Non-cardiac chest pain   ? hops and evaluation 2011 dr Riley Kill  ? Osteoporosis 07/13/2018  ? Other and unspecified hyperlipidemia 04/27/2012  ? Has been on meds for a number of years .  Low dose crestor .  Baseline reported as 280/   ? PFO (patent foramen ovale)   ? Moderate size  ? Pneumonia 2007  ? and flu hospitalized   ? Primary biliary cholangitis (HCC) 04/08/2018  ? Sjogren's disease (HCC)   ? Stroke [facility])   ? on hrt double vision- possible blood clot on  brain; Dr[facility] Lesia Sago at [facility]   ? Thyroid disease   ? ? ?Allergies  ?Allergen Reactions  ? Estrogens Other (See Comments)  ?  Had stroke on HRT/OCP  ? Codeine   ?  REACTION: nausea  ? Ibuprofen   ?  REACTION: nause  ? Sulfa Antibiotics Itching  ?  Pt reported on 05/03/2020  ? ? ?ROS ?General: Denies fever, chills, night sweats, changes in weight, changes in appetite ?HEENT: Denies headaches, ear pain, changes in vision, rhinorrhea, sore throat + dry eyes, dry mouth ?CV: Denies CP, palpitations, SOB, orthopnea ?Pulm: Denies SOB, cough, wheezing ?GI: Denies abdominal pain, nausea, vomiting, diarrhea, constipation ?GU: Denies dysuria, hematuria, frequency, vaginal discharge ?Msk: Denies muscle cramps  +joint edema and stiffness ?Neuro: Denies weakness, numbness, tingling ?Skin: Denies rashes, bruising ?Psych: Denies depression, anxiety, hallucinations ? ?   ?Objective:  ?  ?Blood pressure (!) 156/82, pulse 79, temperature 98.4 ?F (36.9 ?C), temperature source Oral, height 5\' 5"  (1.651 m), weight 127 lb 3.2 oz (57.7 kg), last menstrual period 04/25/2009, SpO2 97 %. ? ?Gen. Pleasant, well-nourished, in no distress, normal affect   ?HEENT: [place]/AT, face symmetric, conjunctiva clear, no scleral  icterus, PERRLA, EOMI, nares patent without drainage ?Lungs: no accessory muscle use, CTAB, no wheezes or rales ?Cardiovascular: RRR, no m/r/g, trace b/l LE edema to shin.   ?Musculoskeletal: Visible edema in bilateral ankles, nonpitting.  No TTP of bilateral wrist, hands, elbows, knees.  No deformities, no cyanosis or clubbing, normal tone ?Neuro:  A&Ox3, CN II-XII intact, normal gait ?Skin:  Warm, no lesions/ rash ? ? ?Wt Readings from Last 3 Encounters:  ?03/04/22 127 lb 3.2 oz (57.7 kg)  ?02/27/22 122 lb (55.3 kg)  ?01/16/22 125 lb (56.7 kg)  ? ? ?Lab Results  ?Component Value Date  ? WBC 3.1 (L) 02/27/2022  ? HGB 11.9 (L) 02/27/2022  ? HCT 36.2 02/27/2022  ? PLT 108 (L) 02/27/2022  ? GLUCOSE 94 02/27/2022  ? CHOL  169 06/12/2021  ? TRIG 65.0 06/12/2021  ? HDL 80.40 06/12/2021  ? LDLCALC 76 06/12/2021  ? ALT 57 (H) 02/27/2022  ? AST 69 (H) 02/27/2022  ? NA 135 02/27/2022  ? K 3.8 02/27/2022  ? CL 103 02/27/2022  ? CREATININE 0.93 02/27/2022  ? BUN 14 02/27/2022  ? CO2 20 (L) 02/27/2022  ? TSH 2.40 09/12/2021  ? INR 1.0 02/16/2020  ? HGBA1C 5.6 06/12/2021  ? ? ?Assessment/Plan: ? ?Essential hypertension ?-New problem ?-Continued elevation greater than 140/90 over several office visits ?-Discussed starting low-dose medication ?-We will start benazepril 5 mg daily ?-Lifestyle modifications encouraged ?-Patient encouraged to obtain BP cuff for home monitoring ?-Given handout ?- Plan: CMP, TSH, benazepril (LOTENSIN) 5 MG tablet ? ?Multiple joint pain ?-New problem ?-Discussed concern for possible autoimmune disorder causing multiple joint pain ?-We will obtain labs ?-Pt avoiding Tylenol given h/o PSC and elevated LFTs from 02/27/22 in ED.   Discussed topical analgesics, heat, ice, stretching, etc. ?-Patient encouraged to follow-up with rheumatology ? - Plan: CBC with Differential/Platelet, Sedimentation Rate, C-reactive Protein, Rheumatoid Factor, Lupus (SLE) Analysis, Uric Acid ? ?Edema of both ankles ?-New concern ?- Plan: Sedimentation Rate, C-reactive Protein, Rheumatoid Factor, Lupus (SLE) Analysis ? ?Edema of both wrists ?-New concern ?- Plan: Sedimentation Rate, C-reactive Protein, Rheumatoid Factor, Lupus (SLE) Analysis ? ?Joint stiffness ?-New concern ?- Plan: CBC with Differential/Platelet, Sedimentation Rate, C-reactive Protein, Rheumatoid Factor, Lupus (SLE) Analysis, Uric Acid ? ?Sjogren's syndrome, with unspecified organ involvement (HCC) ?-Continue pilocarpine 5 mg twice daily ?-Continue follow-up with rheumatology, Dr. Corliss Skains ? ?F/u prn  ? ?Abbe Amsterdam, MD ?

## 2022-03-05 ENCOUNTER — Encounter: Payer: Self-pay | Admitting: Rheumatology

## 2022-03-05 ENCOUNTER — Encounter: Payer: Self-pay | Admitting: Family Medicine

## 2022-03-05 LAB — COMPREHENSIVE METABOLIC PANEL
ALT: 69 U/L — ABNORMAL HIGH (ref 0–35)
AST: 64 U/L — ABNORMAL HIGH (ref 0–37)
Albumin: 4.2 g/dL (ref 3.5–5.2)
Alkaline Phosphatase: 119 U/L — ABNORMAL HIGH (ref 39–117)
BUN: 11 mg/dL (ref 6–23)
CO2: 27 mEq/L (ref 19–32)
Calcium: 9.7 mg/dL (ref 8.4–10.5)
Chloride: 106 mEq/L (ref 96–112)
Creatinine, Ser: 0.91 mg/dL (ref 0.40–1.20)
GFR: 65.28 mL/min (ref >60.00)
Glucose, Bld: 88 mg/dL (ref 70–99)
Potassium: 3.7 mEq/L (ref 3.5–5.1)
Sodium: 141 mEq/L (ref 135–145)
Total Bilirubin: 0.5 mg/dL (ref 0.2–1.2)
Total Protein: 7.2 g/dL (ref 6.0–8.3)

## 2022-03-05 LAB — CBC WITH DIFFERENTIAL/PLATELET
Basophils Absolute: 0 10*3/uL (ref 0.0–0.1)
Basophils Relative: 0.8 % (ref 0.0–3.0)
Eosinophils Absolute: 0.1 10*3/uL (ref 0.0–0.7)
Eosinophils Relative: 1.8 % (ref 0.0–5.0)
HCT: 36.4 % (ref 36.0–46.0)
Hemoglobin: 12.3 g/dL (ref 12.0–15.0)
Lymphocytes Relative: 28.9 % (ref 12.0–46.0)
Lymphs Abs: 1.1 10*3/uL (ref 0.7–4.0)
MCHC: 33.7 g/dL (ref 30.0–36.0)
MCV: 91.1 fl (ref 78.0–100.0)
Monocytes Absolute: 0.3 10*3/uL (ref 0.1–1.0)
Monocytes Relative: 8.1 % (ref 3.0–12.0)
Neutro Abs: 2.3 10*3/uL (ref 1.4–7.7)
Neutrophils Relative %: 60.4 % (ref 43.0–77.0)
Platelets: 153 10*3/uL (ref 150.0–400.0)
RBC: 4 Mil/uL (ref 3.87–5.11)
RDW: 14.2 % (ref 11.5–15.5)
WBC: 3.8 10*3/uL — ABNORMAL LOW (ref 4.0–10.5)

## 2022-03-05 LAB — TSH: TSH: 4.42 u[IU]/mL (ref 0.35–5.50)

## 2022-03-05 LAB — RHEUMATOID FACTOR: Rheumatoid fact SerPl-aCnc: <14 IU/mL (ref <14)

## 2022-03-05 LAB — SEDIMENTATION RATE: Sed Rate: 20 mm/hr (ref 0–30)

## 2022-03-05 LAB — C-REACTIVE PROTEIN: CRP: 1.3 mg/dL (ref 0.5–20.0)

## 2022-03-05 LAB — URIC ACID: Uric Acid, Serum: 3.9 mg/dL (ref 2.4–7.0)

## 2022-03-06 ENCOUNTER — Telehealth: Payer: Self-pay | Admitting: Internal Medicine

## 2022-03-06 ENCOUNTER — Telehealth: Payer: Self-pay | Admitting: Rheumatology

## 2022-03-06 ENCOUNTER — Encounter: Payer: Self-pay | Admitting: Rheumatology

## 2022-03-06 ENCOUNTER — Ambulatory Visit: Payer: BC Managed Care – PPO | Admitting: Rheumatology

## 2022-03-06 ENCOUNTER — Other Ambulatory Visit: Payer: Self-pay

## 2022-03-06 VITALS — BP 162/101 | HR 66 | Ht 65.0 in | Wt 125.0 lb

## 2022-03-06 DIAGNOSIS — M130 Polyarthritis, unspecified: Secondary | ICD-10-CM

## 2022-03-06 DIAGNOSIS — Z79899 Other long term (current) drug therapy: Secondary | ICD-10-CM | POA: Diagnosis not present

## 2022-03-06 DIAGNOSIS — M3501 Sicca syndrome with keratoconjunctivitis: Secondary | ICD-10-CM | POA: Diagnosis not present

## 2022-03-06 DIAGNOSIS — E559 Vitamin D deficiency, unspecified: Secondary | ICD-10-CM

## 2022-03-06 DIAGNOSIS — Q2112 Patent foramen ovale: Secondary | ICD-10-CM

## 2022-03-06 DIAGNOSIS — R7689 Other specified abnormal immunological findings in serum: Secondary | ICD-10-CM

## 2022-03-06 DIAGNOSIS — Z8673 Personal history of transient ischemic attack (TIA), and cerebral infarction without residual deficits: Secondary | ICD-10-CM

## 2022-03-06 DIAGNOSIS — Z8639 Personal history of other endocrine, nutritional and metabolic disease: Secondary | ICD-10-CM

## 2022-03-06 DIAGNOSIS — R768 Other specified abnormal immunological findings in serum: Secondary | ICD-10-CM | POA: Diagnosis not present

## 2022-03-06 DIAGNOSIS — K743 Primary biliary cirrhosis: Secondary | ICD-10-CM

## 2022-03-06 DIAGNOSIS — R748 Abnormal levels of other serum enzymes: Secondary | ICD-10-CM

## 2022-03-06 DIAGNOSIS — R7989 Other specified abnormal findings of blood chemistry: Secondary | ICD-10-CM

## 2022-03-06 DIAGNOSIS — M85852 Other specified disorders of bone density and structure, left thigh: Secondary | ICD-10-CM

## 2022-03-06 DIAGNOSIS — M85851 Other specified disorders of bone density and structure, right thigh: Secondary | ICD-10-CM

## 2022-03-06 NOTE — Telephone Encounter (Signed)
Inbound call from patient would like a call back to have an emergency appt. Reports her LFTs are elevated  ?

## 2022-03-06 NOTE — Telephone Encounter (Signed)
Patient advised Dr. Corliss Skains reached out to Dr. Leone Payor earlier today.  He already has patient scheduled for the 17th.  Patient advised she may send a direct message to request an earlier appointment with Dr. Leone Payor.  We can send a prescription for prednisone once her blood pressure improves.  She should contact us once she gets her blood pressure medication. Patient expressed understanding.  ?

## 2022-03-06 NOTE — Telephone Encounter (Signed)
Tell Dana [name] that I have reviewed her chart and Namzaric she is having this problem. ? ?It is possible he has abnormal "liver test" are actually coming from muscle and the inflammation in her body in the joints. ? ?In order to sort that out further and before her visit with Marchelle Folks I would like her to do the following tests this week please. ? ?CK ?GGT ?5 prime nucleotidase ?Recheck LFTs also ? ?Diagnosis can be abnormal LFTs and abnormal alkaline phosphatase ? ?I am copying Marchelle Folks on this as well.-Patient with primary biliary cholangitis.  Recently had a slightly thickened gallbladder wall as well.  I did not think that was a major issue but there were some patient concerned so we decided to recheck at 3 months. ?

## 2022-03-06 NOTE — Telephone Encounter (Signed)
I reached out to Dr. Leone Payor earlier today.  He already have patient scheduled for the 17th.  Patient may send a direct message to request an earlier appointment with Dr. Leone Payor.  We can send a prescription for prednisone once her blood pressure improves.  She should contact us once she gets her blood pressure medication.

## 2022-03-06 NOTE — Telephone Encounter (Signed)
Pt made aware of Dr. Leone Payor Recommendations:  ?Orders for labs placed in Epic: pt made aware to come this week to have labs drawn: ?Pt verbalized understanding with all questions answered.  ? ?

## 2022-03-06 NOTE — Telephone Encounter (Signed)
Pt was seen by dr banks on 03-04-2022 for edema and her insurance will not cover bp med benazepril (LOTENSIN) 5 MG tablet dr banks sent to pharm. Pt is at dr deveshwar office and her bp is still elevated and per pt dr deveshwar told her to call her provider office. Please send another bp med to her pharm  ?[id number] - [place], [place] - [address] Phone:  [phone]  ?Fax:  [phone]  ?  ? ?

## 2022-03-06 NOTE — Telephone Encounter (Signed)
Pt stated that she recently had edema that developed in her hands, knees and ankles: ?Pt had an appointment this AM to see her Rheumatologist Dr. Pollyann Savoy: Pt stated that Dr. Corliss Skains stated that she could not prescribe anything for her due to her LFT's being elevated: ?Pt was scheduled to see Quentin Mulling PA on 03/11/2022 at 2:00: Pt made aware ? ?

## 2022-03-06 NOTE — Progress Notes (Signed)
? ?Office Visit Note ? ?Patient: Dana [name]             ?Date of Birth: 20-Jan-1956           ?MRN: [id number]             ?PCP: Burnis Medin, MD ?Referring: Burnis Medin, MD ?Visit Date: 03/06/2022 ?Occupation: @GUAROCC @ ? ?Subjective:  ?Swelling in hands and feet ? ?History of Present Illness: Dana [name] is a 66 y.o. female with history of Sjogren's and primary biliary cholangitis.  She states about 3 weeks ago she developed fever, myalgias and cough.  She was seen at the urgency room with Tommie Ard.  She states she had a chest x-ray which was normal.  She was advised to over-the-counter medications.  She states her symptoms gradually improved but she still has cough going on.  She states last week she started having pain and swelling in multiple joints.  She states her both knees were swollen and her ankles were swollen.  She also started having swelling in her hands.  She was concerned that she was retaining fluid.  Her blood pressure was also elevated.  She was seen by Dr. Volanda Napoleon who advised low salt diet and called in Benazepril.  Patient states the Benazepril was not covered by her insurance and was very expensive and she could not get the prescription.  She states the fluid on her knee resolved but her hands and feet continue to be swollen.  Denies any joint pain.  Been also having elevated LFTs.  She has been seeing Dr. Arelia Longest.  She also has an appointment with the dermatologist for a rash.  He continues to have dry mouth and dry eyes. ? ?Activities of Daily Living:  ?Patient reports morning stiffness for 1 hour.   ?Patient Reports nocturnal pain.  ?Difficulty dressing/grooming: Denies ?Difficulty climbing stairs: Reports ?Difficulty getting out of chair: Denies ?Difficulty using hands for taps, buttons, cutlery, and/or writing: Denies ? ?Review of Systems  ?Constitutional:  Negative for fatigue.  ?HENT:  Negative for mouth sores, mouth dryness and nose dryness.   ?Eyes:  Negative for pain, itching  and dryness.  ?Respiratory:  Negative for shortness of breath and difficulty breathing.   ?Cardiovascular:  Negative for chest pain and palpitations.  ?Gastrointestinal:  Negative for blood in stool, constipation and diarrhea.  ?Endocrine: Negative for increased urination.  ?Genitourinary:  Negative for difficulty urinating.  ?Musculoskeletal:  Positive for joint pain, joint pain, joint swelling, myalgias, morning stiffness and myalgias. Negative for muscle tenderness.  ?Skin:  Positive for rash. Negative for color change.  ?Allergic/Immunologic: Negative for susceptible to infections.  ?Neurological:  Positive for headaches. Negative for dizziness, numbness, memory loss and weakness.  ?Hematological:  Positive for bruising/bleeding tendency.  ?Psychiatric/Behavioral:  Negative for confusion.   ? ?PMFS History:  ?Patient Active Problem List  ? Diagnosis Date Noted  ? Abnormal gallbladder ultrasound - slightly thick wall 01/25/2022  ? Elevated blood pressure reading without diagnosis of hypertension 02/16/2020  ? Onychomycosis 08/05/2018  ? Sjogren's disease ([place]) 07/13/2018  ? Osteoporosis 07/13/2018  ? Vitamin D deficiency 07/06/2018  ? Primary biliary cholangitis ([place]) 04/08/2018  ? Tetanus, diphtheria, and acellular pertussis (Tdap) vaccination declined 04/08/2018  ? Trigger point of right shoulder region 03/24/2018  ? Slipped rib syndrome 03/19/2018  ? Nonallopathic lesion of rib cage 03/19/2018  ? Nonallopathic lesion of thoracic region 03/19/2018  ? Nonallopathic lesion of cervical region 03/19/2018  ? Low serum vitamin D  04/09/2016  ? PFO (patent foramen ovale)   ? Abnormal finding on MRI of brain 01/10/2015  ? Visit for preventive health examination 11/24/2014  ? Bilateral arm weakness  hx of  11/23/2014  ? Essential hypertension 11/23/2014  ? Left paraspinal back pain 10/13/2013  ? Chronic constipation 09/16/2013  ? ESR raised 08/23/2013  ? Menopausal hot flushes 04/27/2012  ? Other and unspecified  hyperlipidemia 04/27/2012  ? Migraine with aura   ? Hyperlipidemia 01/03/2012  ? Hypothyroidism 03/21/2010  ? HYPERTENSION, UNSPECIFIED 03/21/2010  ? CEREBROVASCULAR ACCIDENT, HX OF 03/21/2010  ?  ?Past Medical History:  ?Diagnosis Date  ? Abnormal finding on MRI of brain 01/10/2015  ? CVA (cerebral infarction) 2010  ? diplopia neg acute MRi whit matter changes neg LP   ? Elevated LFTs 11/23/2014  ? High blood pressure   ? readings  ? High cholesterol   ? Hip pain, acute 10/13/2013  ? Hx of varicella   ? Low bone density 07/13/2018  ? Migraine with aura   ? auras  ? Non-cardiac chest pain   ? hops and evaluation 2011 dr Lia Foyer  ? Osteoporosis 07/13/2018  ? Other and unspecified hyperlipidemia 04/27/2012  ? Has been on meds for a number of years .  Low dose crestor .  Baseline reported as 280/   ? PFO (patent foramen ovale)   ? Moderate size  ? Pneumonia 2007  ? and flu hospitalized   ? Primary biliary cholangitis ([place]) 04/08/2018  ? Sjogren's disease ([place])   ? Stroke [facility])   ? on hrt double vision- possible blood clot on brain; Dr. Floyde Parkins at [facility]   ? Thyroid disease   ?  ?Family History  ?Adopted: Yes  ?Problem Relation Age of Onset  ? Alcohol abuse Mother   ? Pancreatic cancer Mother   ? Liver disease Mother   ? Breast cancer Maternal Aunt   ? Healthy Daughter   ? Stomach cancer Neg Hx   ? Esophageal cancer Neg Hx   ? ?Past Surgical History:  ?Procedure Laterality Date  ? BREAST SURGERY    ? implants  ? COLONOSCOPY    ? FOOT SURGERY    ? had bunyon removed  ? TONSILLECTOMY    ? TOOTH EXTRACTION  11/30/2018  ? TUBAL LIGATION    ? ?Social History  ? ?Social History Narrative  ? Hole between heart and lung-untreatable   ? Married   ? orig from  [place]  ? hh of 2  ? Pet dog euthanized   ? 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 Has had grad school education.   ? Neg tad  ? Some caffiene   ? Seatbelts no ets fa stored   ? G1P1  ? Physically active    ? 8 yo grandaughter   ? Patient is  left handed.  ?   ? ?Immunization History  ?Administered Date(s) Administered  ? Fluad Quad(high Dose 65+) 09/10/2019, 11/14/2020  ? Influenza Split 09/08/2012  ? Influenza,inj,Quad PF,6+ Mos 08/23/2013, 11/16/2014, 09/07/2015, 08/23/2016, 09/02/2017, 09/08/2018  ? Moderna Sars-Covid-2 Vaccination 07/17/2020, 08/14/2020  ? Pneumococcal Conjugate-13 04/26/2011, 09/10/2019  ? Pneumococcal Polysaccharide-23 06/12/2021  ? Zoster Recombinat (Shingrix) 06/12/2021, 09/12/2021  ?  ? ?Objective: ?Vital Signs: BP (!) 162/101 (BP Location: Left Arm, Patient Position: Sitting, Cuff Size: Normal)   Pulse 66   Ht 5' 5"  (1.651 m)   Wt 125 lb (56.7 kg)   LMP 04/25/2009  BMI 20.80 kg/m?   ? ?Physical Exam ?Vitals and nursing note reviewed.  ?Constitutional:   ?   Appearance: She is well-developed.  ?HENT:  ?   Head: Normocephalic and atraumatic.  ?Eyes:  ?   Conjunctiva/sclera: Conjunctivae normal.  ?Cardiovascular:  ?   Rate and Rhythm: Normal rate and regular rhythm.  ?   Heart sounds: Normal heart sounds.  ?Pulmonary:  ?   Effort: Pulmonary effort is normal.  ?   Breath sounds: Normal breath sounds.  ?Abdominal:  ?   General: Bowel sounds are normal.  ?   Palpations: Abdomen is soft.  ?Musculoskeletal:  ?   Cervical back: Normal range of motion.  ?Lymphadenopathy:  ?   Cervical: No cervical adenopathy.  ?Skin: ?   General: Skin is warm and dry.  ?   Capillary Refill: Capillary refill takes less than 2 seconds.  ?Neurological:  ?   Mental Status: She is alert and oriented to person, place, and time.  ?Psychiatric:     ?   Behavior: Behavior normal.  ? ?Musculoskeletal examination: C-spine was in good range of motion.  Shoulder joints and elbow joints with good range of motion.  She has synovitis over bilateral wrist joints with no tenderness.  There was no tenderness over MCPs PIPs or DIPs.  She has warmth swelling and effusion in her left knee joint.  Warmth was noted in her right knee joint.  She has swelling in over  bilateral ankle joints.  There was no tenderness over ankles, MTPs or PIPs ? ? ?CDAI Exam: ?CDAI Score: 8.4  ?Patient Global: 0 mm; Provider Global: 4 mm ?Swollen: 6 ; Tender: 6  ?Joint Exam 03/06/2022  ? ?   Right  Left

## 2022-03-06 NOTE — Telephone Encounter (Signed)
Patient called the office stating that she has a friend is a nurse who believes she needs to be seen with Dr Leone Payor urgently before Monday. She states she would like Dr. Corliss Skains to call Dr. Leone Payor to try and get her in sooner because Dr. Corliss Skains couldn't treat her due to elevated liver enzymes. Patient is very nervous. Patient states she is very depressed. ?

## 2022-03-07 ENCOUNTER — Other Ambulatory Visit (INDEPENDENT_AMBULATORY_CARE_PROVIDER_SITE_OTHER): Payer: BC Managed Care – PPO

## 2022-03-07 DIAGNOSIS — R7989 Other specified abnormal findings of blood chemistry: Secondary | ICD-10-CM | POA: Diagnosis not present

## 2022-03-07 DIAGNOSIS — R748 Abnormal levels of other serum enzymes: Secondary | ICD-10-CM | POA: Diagnosis not present

## 2022-03-07 LAB — HEPATIC FUNCTION PANEL
ALT: 43 U/L — ABNORMAL HIGH (ref 0–35)
AST: 36 U/L (ref 0–37)
Albumin: 4.1 g/dL (ref 3.5–5.2)
Alkaline Phosphatase: 108 U/L (ref 39–117)
Bilirubin, Direct: 0.1 mg/dL (ref 0.0–0.3)
Total Bilirubin: 0.5 mg/dL (ref 0.2–1.2)
Total Protein: 7.1 g/dL (ref 6.0–8.3)

## 2022-03-07 LAB — CK: Total CK: 23 U/L (ref 7–177)

## 2022-03-07 LAB — GAMMA GT: GGT: 31 U/L (ref 7–51)

## 2022-03-08 ENCOUNTER — Encounter: Payer: Self-pay | Admitting: Internal Medicine

## 2022-03-08 NOTE — Progress Notes (Signed)
? ? ?03/11/2022 ?Dana [name] ?[id number] ?December 19, 1955 ? ?Referring provider: Madelin Headings, MD ?Primary GI doctor: Dr. Leone Payor ? ?ASSESSMENT AND PLAN:  ? ?Primary biliary cholangitis (HCC) ?Repeat US in June, will need to see Dr. Leone Payor afterwards ?Some pruritis on AB, no rashes, if worsens can consider bile acid sequestrant.  ? ?Chronic constipation ?Continue medications ? ?Elevated LFTs with edema/prodrome ?In setting of PBC  ?Repeat labs downtrending, GGT negative.  ?Several autoimmune labs negative with Devashwar ?? From inflammation versus infection- no history of tick exposure.  ?Symptoms have resolved, will continue to monitor.  ? ?Future Appointments  ?Date Time Provider Department Center  ?03/27/2022  2:15 PM Pollyann Savoy, MD [facility] None  ?05/10/2022 10:00 AM [facility] [place]  ? ? ? ?History of Present Illness:  ?66 y.o. female  with a past medical history of primary biliary cholangitis, chronic constipation, vitamin D deficiency, Sjogren's followed by Dr. Perley Jain, osteoporosis and others listed below, returns to clinic today for evaluation of elevated LFTs. ? ?Several weeks ago had low grade temp, flet like flu, went to [facility] 04/05, had negative labs. Following Saturday left knee, ankles, hands and wrist were large with sudden edema.  ? ?Patient had slightly elevated LFTs 03/04/2022 with Dr. Corliss Skains for, had repeat liver function 03/07/2022 which trended down.   ?Patient had negative GGT, normal CPK. ?Pending nucleotidase 5. ? ?She is having itching mainly on stomach. No rash.  ?Tried cream from derm that did not help.  ?No dark urine or stool.  ?No recent tick exposure.  ?Does not follow with liver clinic.  ?Suppose to have repeat AB [place] in June.  ? ? ?Recent Labs  ?Lab 03/07/22 ?1335  ?AST 36  ?ALT 43*  ?ALKPHOS 108  ?BILITOT 0.5  ?PROT 7.1  ?ALBUMIN 4.1  ?Platelets 153.0 ?03/06/2022  HepBsAG NON-REACTIVE  ?03/04/2022 CRP 1.3  ? ?CBC  03/04/2022  ?HGB 12.3 MCV 91.1 without evidence of  anemia ?WBC 3.8  ?Anemia panel 06/06/2020  ?Iron 60 Ferritin 56  ?06/06/2020  B12 345 ?Kidney function 03/04/2022  ?BUN 11 Cr 0.91  ?GFR >60  ?Potassium 3.7   ?03/04/2022 TSH 4.42 ? ?Current Medications:  ? ?Current Outpatient Medications (Endocrine & Metabolic):  ?  alendronate (FOSAMAX) 70 MG tablet, Take 1 tablet (70 mg total) by mouth once a week. Take with a full glass of water on an empty stomach. ?  levothyroxine (SYNTHROID) 88 MCG tablet, TAKE 1 TABLET (88 MCG TOTAL) BY MOUTH DAILY. DOSAGE CHANGE ? ?Current Outpatient Medications (Cardiovascular):  ?  atorvastatin (LIPITOR) 20 MG tablet, TAKE 1 TABLET BY MOUTH ONCE DAILY ?  lisinopril (ZESTRIL) 10 MG tablet, Take 1 tablet (10 mg total) by mouth daily. ? ? ?Current Outpatient Medications (Analgesics):  ?  aspirin 81 MG tablet, Take 81 mg by mouth daily. ? ? ?Current Outpatient Medications (Other):  ?  pilocarpine (SALAGEN) 5 MG tablet, TAKE 1 TABLET BY MOUTH TWICE A DAY ?  ursodiol (ACTIGALL) 250 MG tablet, TAKE 1 TABLET BY MOUTH TWICE A DAY ?  VITAMIN D PO, Take by mouth daily. ? ?Surgical History:  ?She  has a past surgical history that includes Tonsillectomy; Foot surgery; Breast surgery; Colonoscopy; Tubal ligation; and Tooth extraction (11/30/2018). ?Family History:  ?Her family history includes Alcohol abuse in her mother; Breast cancer in her maternal aunt; Healthy in her daughter; Liver disease in her mother; Pancreatic cancer in her mother. She was adopted. ?Social History:  ? reports that she has  never smoked. She has never been exposed to tobacco smoke. She has never used smokeless tobacco. She reports that she does not drink alcohol and does not use drugs. ? ?Current Medications, Allergies, Past Medical History, Past Surgical History, Family History and Social History were reviewed in [employer] record. ? ?Physical Exam: ?BP 130/84   Pulse 73   Ht 5\' 4"  (1.626 m)   Wt 126 lb 12.8 oz (57.5 kg)   LMP 04/25/2009   SpO2 97%    BMI 21.77 kg/m?  ?General:   Pleasant, well developed female in no acute distress ?Heart:  Regular rate and rhythm; no murmurs ?Pulm: Clear anteriorly; no wheezing ?Abdomen:  Soft, Non-distended AB, Active bowel sounds. No tenderness . , No organomegaly appreciated. ?Extremities:  without  edema. ?Neurologic:  Alert and  oriented x4;  No focal deficits.  ?Psych:  Cooperative. Normal mood and affect. ? ? ?06/25/2009, PA-C ?03/11/22 ?

## 2022-03-09 LAB — LUPUS (SLE) ANALYSIS
Anti Nuclear Antibody (ANA): POSITIVE — AB
Anti-striation Abs: NEGATIVE
Complement C4, Serum: 50 mg/dL — ABNORMAL HIGH (ref 12–38)
ENA RNP Ab: <0.2 AI (ref 0.0–0.9)
ENA SM Ab Ser-aCnc: 0.8 AI (ref 0.0–0.9)
ENA SSA (RO) Ab: 1.1 AI — ABNORMAL HIGH (ref 0.0–0.9)
ENA SSB (LA) Ab: <0.2 AI (ref 0.0–0.9)
Mitochondrial Ab: 85 Units — ABNORMAL HIGH (ref 0.0–20.0)
Parietal Cell Ab: 2.1 Units (ref 0.0–20.0)
Scleroderma (Scl-70) (ENA) Antibody, IgG: <0.2 AI (ref 0.0–0.9)
Smooth Muscle Ab: 13 Units (ref 0–19)
Thyroperoxidase Ab SerPl-aCnc: <9 IU/mL (ref 0–34)
dsDNA Ab: <1 IU/mL (ref 0–9)

## 2022-03-11 ENCOUNTER — Ambulatory Visit: Payer: BC Managed Care – PPO | Admitting: Physician Assistant

## 2022-03-11 ENCOUNTER — Other Ambulatory Visit: Payer: Self-pay | Admitting: Family Medicine

## 2022-03-11 ENCOUNTER — Encounter: Payer: Self-pay | Admitting: Physician Assistant

## 2022-03-11 VITALS — BP 130/84 | HR 73 | Ht 64.0 in | Wt 126.8 lb

## 2022-03-11 DIAGNOSIS — R7989 Other specified abnormal findings of blood chemistry: Secondary | ICD-10-CM

## 2022-03-11 DIAGNOSIS — K743 Primary biliary cirrhosis: Secondary | ICD-10-CM

## 2022-03-11 DIAGNOSIS — I10 Essential (primary) hypertension: Secondary | ICD-10-CM

## 2022-03-11 DIAGNOSIS — K5909 Other constipation: Secondary | ICD-10-CM

## 2022-03-11 MED ORDER — LISINOPRIL 10 MG PO TABS: 10.0000 mg | ORAL_TABLET | Freq: Every day | ORAL | 3 refills | Status: DC

## 2022-03-11 NOTE — Patient Instructions (Signed)
If itching continues please let us know and there are medications we can give you for this.  ?Will plan for follow up after Ultrasound, will determine time with Dr. Carlean Purl ? ?Let us know if any thing changes.  ?

## 2022-03-11 NOTE — Telephone Encounter (Signed)
A prescription for lisinopril.  If medication still not covered by insurance patient can inquire what medications they do cover for blood pressure. ?

## 2022-03-12 LAB — HEPATITIS C ANTIBODY
Hepatitis C Ab: NONREACTIVE
SIGNAL TO CUT-OFF: 0.03 (ref <1.00)

## 2022-03-12 LAB — QUANTIFERON-TB GOLD PLUS
Mitogen-NIL: 3.96 IU/mL
NIL: 0.02 IU/mL
QuantiFERON-TB Gold Plus: NEGATIVE
TB1-NIL: 0 IU/mL
TB2-NIL: 0 IU/mL

## 2022-03-12 LAB — HEPATITIS B SURFACE ANTIGEN: Hepatitis B Surface Ag: NONREACTIVE

## 2022-03-12 LAB — CYCLIC CITRUL PEPTIDE ANTIBODY, IGG: Cyclic Citrullin Peptide Ab: <16 UNITS

## 2022-03-12 LAB — IGG, IGA, IGM
IgG (Immunoglobin G), Serum: 1091 mg/dL (ref 600–1540)
IgM, Serum: 243 mg/dL (ref 50–300)
Immunoglobulin A: 142 mg/dL (ref 70–320)

## 2022-03-12 LAB — HIV ANTIBODY (ROUTINE TESTING W REFLEX): HIV 1&2 Ab, 4th Generation: NONREACTIVE

## 2022-03-12 LAB — THIOPURINE METHYLTRANSFERASE (TPMT), RBC: Thiopurine Methyltransferase, RBC: 14 nmol/hr/mL RBC

## 2022-03-12 LAB — HEPATITIS B CORE ANTIBODY, IGM: Hep B C IgM: NONREACTIVE

## 2022-03-12 NOTE — Telephone Encounter (Signed)
Spoke with pt, stated she was able to get the benazepril, 90 days for about $4. She said she spoke with pharmacy about it and they had added to the Rx, and that was why they said it was not covered. Stated they got it fixed and she is taking the benazepril. ?

## 2022-03-14 LAB — NUCLEOTIDASE, 5', BLOOD: 5-Nucleotidase: 6 U/L (ref 0–10)

## 2022-03-14 NOTE — Progress Notes (Deleted)
Office Visit Note  Patient: Dana [name]             Date of Birth: 1956/02/09           MRN: [id number]             PCP: Burnis Medin, MD Referring: Burnis Medin, MD Visit Date: 03/27/2022 Occupation: @GUAROCC @  Subjective:  No chief complaint on file.   History of Present Illness: Dana [name] is a 66 y.o. female ***   Activities of Daily Living:  Patient reports morning stiffness for *** {minute/hour:19697}.   Patient {ACTIONS;DENIES/REPORTS:21021675::"Denies"} nocturnal pain.  Difficulty dressing/grooming: {ACTIONS;DENIES/REPORTS:21021675::"Denies"} Difficulty climbing stairs: {ACTIONS;DENIES/REPORTS:21021675::"Denies"} Difficulty getting out of chair: {ACTIONS;DENIES/REPORTS:21021675::"Denies"} Difficulty using hands for taps, buttons, cutlery, and/or writing: {ACTIONS;DENIES/REPORTS:21021675::"Denies"}  No Rheumatology ROS completed.   PMFS History:  Patient Active Problem List   Diagnosis Date Noted   Abnormal gallbladder ultrasound - slightly thick wall 01/25/2022   Elevated blood pressure reading without diagnosis of hypertension 02/16/2020   Onychomycosis 08/05/2018   Sjogren's disease ([place]) 07/13/2018   Osteoporosis 07/13/2018   Vitamin D deficiency 07/06/2018   Primary biliary cholangitis ([place]) 04/08/2018   Tetanus, diphtheria, and acellular pertussis (Tdap) vaccination declined 04/08/2018   Trigger point of right shoulder region 03/24/2018   Slipped rib syndrome 03/19/2018   Nonallopathic lesion of rib cage 03/19/2018   Nonallopathic lesion of thoracic region 03/19/2018   Nonallopathic lesion of cervical region 03/19/2018   Low serum vitamin D 04/09/2016   PFO (patent foramen ovale)    Abnormal finding on MRI of brain 01/10/2015   Visit for preventive health examination 11/24/2014   Bilateral arm weakness  hx of  11/23/2014   Essential hypertension 11/23/2014   Left paraspinal back pain 10/13/2013   Chronic constipation 09/16/2013   ESR raised  08/23/2013   Menopausal hot flushes 04/27/2012   Other and unspecified hyperlipidemia 04/27/2012   Migraine with aura    Hyperlipidemia 01/03/2012   Hypothyroidism 03/21/2010   HYPERTENSION, UNSPECIFIED 03/21/2010   CEREBROVASCULAR ACCIDENT, HX OF 03/21/2010    Past Medical History:  Diagnosis Date   Abnormal finding on MRI of brain 01/10/2015   CVA (cerebral infarction) 2010   diplopia neg acute MRi whit matter changes neg LP    Elevated LFTs 11/23/2014   High blood pressure    readings   High cholesterol    Hip pain, acute 10/13/2013   Hx of varicella    Low bone density 07/13/2018   Migraine with aura    auras   Non-cardiac chest pain    hops and evaluation 2011 dr Lia Foyer   Osteoporosis 07/13/2018   Other and unspecified hyperlipidemia 04/27/2012   Has been on meds for a number of years .  Low dose crestor .  Baseline reported as 280/    PFO (patent foramen ovale)    Moderate size   Pneumonia 2007   and flu hospitalized    Primary biliary cholangitis ([place]) 04/08/2018   Sjogren's disease ([place])    Stroke ([place])    on hrt double vision- possible blood clot on brain; Dr. Floyde Parkins [facility]    Thyroid disease     Family History  Adopted: Yes  Problem Relation Age of Onset   Alcohol abuse Mother    Pancreatic cancer Mother    Liver disease Mother    Healthy Daughter    Breast cancer Maternal Aunt    Stomach cancer Neg Hx    Esophageal cancer Neg Hx  Colon cancer Neg Hx    Past Surgical History:  Procedure Laterality Date   BREAST SURGERY     implants   COLONOSCOPY     FOOT SURGERY     had bunyon removed   TONSILLECTOMY     TOOTH EXTRACTION  11/30/2018   TUBAL LIGATION     Social History   Social History Narrative   Hole between heart and lung-untreatable    Married    orig from  [place] of 2   Pet dog euthanized    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    Neg tad   Some caffiene     Seatbelts no ets fa stored    G1P1   Physically active     5 yo grandaughter    Patient is left handed.      Immunization History  Administered Date(s) Administered   Fluad Quad(high Dose 65+) 09/10/2019, 11/14/2020   Influenza Split 09/08/2012   Influenza,inj,Quad PF,6+ Mos 08/23/2013, 11/16/2014, 09/07/2015, 08/23/2016, 09/02/2017, 09/08/2018   Moderna Sars-Covid-2 Vaccination 07/17/2020, 08/14/2020   Pneumococcal Conjugate-13 04/26/2011, 09/10/2019   Pneumococcal Polysaccharide-23 06/12/2021   Zoster Recombinat (Shingrix) 06/12/2021, 09/12/2021     Objective: Vital Signs: LMP 04/25/2009    Physical Exam   Musculoskeletal Exam: ***  CDAI Exam: CDAI Score: -- Patient Global: --; Provider Global: -- Swollen: --; Tender: -- Joint Exam 03/27/2022   No joint exam has been documented for this visit   There is currently no information documented on the homunculus. Go to the Rheumatology activity and complete the homunculus joint exam.  Investigation: No additional findings.  Imaging: DG Chest 2 View  Result Date: 02/27/2022 CLINICAL DATA:  Possible pneumonia EXAM: CHEST - 2 VIEW COMPARISON:  03/09/2018 FINDINGS: The heart size and mediastinal contours are within normal limits. Both lungs are clear. The visualized skeletal structures are unremarkable. IMPRESSION: No active cardiopulmonary disease. Electronically Signed   By: Ulyses Jarred M.D.   On: 02/27/2022 21:12    Recent Labs: Lab Results  Component Value Date   WBC 3.8 (L) 03/04/2022   HGB 12.3 03/04/2022   PLT 153.0 03/04/2022   NA 141 03/04/2022   K 3.7 03/04/2022   CL 106 03/04/2022   CO2 27 03/04/2022   GLUCOSE 88 03/04/2022   BUN 11 03/04/2022   CREATININE 0.91 03/04/2022   BILITOT 0.5 03/07/2022   ALKPHOS 108 03/07/2022   AST 36 03/07/2022   ALT 43 (H) 03/07/2022   PROT 7.1 03/07/2022   ALBUMIN 4.1 03/07/2022   CALCIUM 9.7 03/04/2022   GFRAA 78 01/11/2021   QFTBGOLDPLUS NEGATIVE 03/06/2022     Speciality Comments: PLQ Eye Exam: 09/25/18 WNL @ [facility] follow up 1 year  Procedures:  No procedures performed Allergies: Estrogens, Codeine, Ibuprofen, and Sulfa antibiotics   Assessment / Plan:     Visit Diagnoses: No diagnosis found.  Orders: No orders of the defined types were placed in this encounter.  No orders of the defined types were placed in this encounter.   Face-to-face time spent with patient was *** minutes. Greater than 50% of time was spent in counseling and coordination of care.  Follow-Up Instructions: No follow-ups on file.   Earnestine Mealing, CMA  Note - This record has been created using Editor, commissioning.  Chart creation errors have been sought, but may not always  have been located. Such creation errors do  not reflect on  the standard of medical care.

## 2022-03-15 ENCOUNTER — Encounter: Payer: Self-pay | Admitting: Family Medicine

## 2022-03-25 ENCOUNTER — Other Ambulatory Visit: Payer: Self-pay | Admitting: Internal Medicine

## 2022-03-27 ENCOUNTER — Ambulatory Visit: Payer: BC Managed Care – PPO | Admitting: Rheumatology

## 2022-03-27 DIAGNOSIS — M130 Polyarthritis, unspecified: Secondary | ICD-10-CM

## 2022-03-27 DIAGNOSIS — K743 Primary biliary cirrhosis: Secondary | ICD-10-CM

## 2022-03-27 DIAGNOSIS — Z8673 Personal history of transient ischemic attack (TIA), and cerebral infarction without residual deficits: Secondary | ICD-10-CM

## 2022-03-27 DIAGNOSIS — E559 Vitamin D deficiency, unspecified: Secondary | ICD-10-CM

## 2022-03-27 DIAGNOSIS — R768 Other specified abnormal immunological findings in serum: Secondary | ICD-10-CM

## 2022-03-27 DIAGNOSIS — Z79899 Other long term (current) drug therapy: Secondary | ICD-10-CM

## 2022-03-27 DIAGNOSIS — Q2112 Patent foramen ovale: Secondary | ICD-10-CM

## 2022-03-27 DIAGNOSIS — M3501 Sicca syndrome with keratoconjunctivitis: Secondary | ICD-10-CM

## 2022-03-27 DIAGNOSIS — Z8639 Personal history of other endocrine, nutritional and metabolic disease: Secondary | ICD-10-CM

## 2022-03-27 DIAGNOSIS — M85851 Other specified disorders of bone density and structure, right thigh: Secondary | ICD-10-CM

## 2022-03-30 ENCOUNTER — Other Ambulatory Visit: Payer: Self-pay | Admitting: Internal Medicine

## 2022-05-10 ENCOUNTER — Ambulatory Visit (HOSPITAL_COMMUNITY)
Admission: RE | Admit: 2022-05-10 | Discharge: 2022-05-10 | Disposition: A | Discharge status: Home / Self Care | Payer: BC Managed Care – PPO | Source: Ambulatory Visit | Attending: Internal Medicine | Admitting: Internal Medicine

## 2022-05-10 DIAGNOSIS — R932 Abnormal findings on diagnostic imaging of liver and biliary tract: Secondary | ICD-10-CM | POA: Insufficient documentation

## 2022-05-12 ENCOUNTER — Other Ambulatory Visit: Payer: Self-pay | Admitting: Internal Medicine

## 2022-05-12 DIAGNOSIS — K743 Primary biliary cirrhosis: Secondary | ICD-10-CM

## 2022-05-18 ENCOUNTER — Other Ambulatory Visit: Payer: Self-pay | Admitting: Physician Assistant

## 2022-06-16 ENCOUNTER — Other Ambulatory Visit: Payer: Self-pay | Admitting: Internal Medicine

## 2022-06-21 ENCOUNTER — Other Ambulatory Visit: Payer: Self-pay | Admitting: Internal Medicine

## 2022-06-28 ENCOUNTER — Other Ambulatory Visit: Payer: Self-pay | Admitting: Internal Medicine

## 2022-07-11 ENCOUNTER — Other Ambulatory Visit: Payer: Self-pay | Admitting: Internal Medicine

## 2022-07-11 ENCOUNTER — Other Ambulatory Visit (INDEPENDENT_AMBULATORY_CARE_PROVIDER_SITE_OTHER): Payer: BC Managed Care – PPO

## 2022-07-11 DIAGNOSIS — R7989 Other specified abnormal findings of blood chemistry: Secondary | ICD-10-CM

## 2022-07-11 LAB — HEPATIC FUNCTION PANEL
ALT: 17 U/L (ref 0–35)
AST: 22 U/L (ref 0–37)
Albumin: 4.3 g/dL (ref 3.5–5.2)
Alkaline Phosphatase: 85 U/L (ref 39–117)
Bilirubin, Direct: 0.1 mg/dL (ref 0.0–0.3)
Total Bilirubin: 0.5 mg/dL (ref 0.2–1.2)
Total Protein: 7.4 g/dL (ref 6.0–8.3)

## 2022-07-25 ENCOUNTER — Other Ambulatory Visit: Payer: Self-pay | Admitting: Internal Medicine

## 2022-07-30 ENCOUNTER — Telehealth: Payer: Self-pay

## 2022-07-30 NOTE — Telephone Encounter (Signed)
-----   Message from Emeline Darling, RN sent at 01/25/2022  9:27 AM EST ----- Regarding: repeat US and Labs Iva Boop, MD  Emeline Darling, RN The liver looks good. The gallbladder wall was slightly thick. I doubt that means anything bad but let's repeat an [place] for primary biliary cholangitis and abnormal gallbladder on ultrasound in 6 mos.   Also do LFT's and INR then.   Please let her know   Sent 01/25/2022

## 2022-07-30 NOTE — Telephone Encounter (Signed)
Chart reviewed: Pt was already noted to have had [place] and Labs:  Reviewed with pt. Pt verbalized understanding with all questions answered.

## 2022-08-17 ENCOUNTER — Other Ambulatory Visit: Payer: Self-pay | Admitting: Rheumatology

## 2022-08-19 NOTE — Telephone Encounter (Signed)
Next Visit: 09/25/2022   Last Visit: 03/06/2022   Last Fill: 05/20/2022   Dx: Sjogren's syndrome with keratoconjunctivitis sicca    Current Dose per office note on 03/06/2022: not discussed   Okay to refill Pilocarpine?

## 2022-08-23 ENCOUNTER — Other Ambulatory Visit: Payer: Self-pay | Admitting: Internal Medicine

## 2022-08-24 ENCOUNTER — Other Ambulatory Visit: Payer: Self-pay | Admitting: Family Medicine

## 2022-08-24 DIAGNOSIS — I10 Essential (primary) hypertension: Secondary | ICD-10-CM

## 2022-08-29 LAB — HM MAMMOGRAPHY

## 2022-08-29 NOTE — Telephone Encounter (Signed)
Spoke to patient. She reports she has been taking Benazepril 5mg  and last pick up the Rx 90 days ago. She also mention the insurance covers it. Also she is not taking Lisinopril.   Also spoke with pharmacist, Lamar Blinks. She reports patient last pick up on 06/03/2022. Insurance covered. And didn't pick up the lisinopril.

## 2022-09-03 NOTE — Progress Notes (Signed)
Chief Complaint  Patient presents with   Annual Exam    HPI: Patient  Dana [name]  66 y.o. comes in today for Preventive Health Care visit  And risk eval PBC sees dr Ellsworth Lennox OSteo[orosis  fosamax ordered  not sure  if she should continue taking  has no se  Bp /HT  5 benazapril  thinks bp fine at home 120 range systolic  Rheum Dr Durenda Age  Thyroid: dialy med 86 HLD atorva 20  Health Maintenance  Topic Date Due   TETANUS/TDAP  Never done   INFLUENZA VACCINE  06/25/2022   COVID-19 Vaccine (3 - Moderna risk series) 09/20/2022 (Originally 09/11/2020)   MAMMOGRAM  08/24/2023   COLONOSCOPY (Pts 45-57yrs Insurance coverage will need to be confirmed)  09/29/2023   Pneumonia Vaccine 41+ Years old  Completed   DEXA SCAN  Completed   Hepatitis C Screening  Completed   Zoster Vaccines- Shingrix  Completed   HPV VACCINES  Aged Out   Health Maintenance Review LIFESTYLE:  Exercise:  yes  Tobacco/ETS:n Alcohol:  Sugar beverages:n Sleep:9hr Drug use: no HH of 2 Work:  still ft  Fiber gummies    ROS:  GEN/ HEENT: No fever, significant weight changes sweats headaches vision problems hearing changes, CV/ PULM; No chest pain shortness of breath cough, syncope,edema  change in exercise tolerance. GI /GU: No adominal pain, vomiting, change in bowel habits. No blood in the stool. No significant GU symptoms. SKIN/HEME: ,no acute skin rashes suspicious lesions or bleeding. No lymphadenopathy, nodules, masses.  NEURO/ PSYCH:  No neurologic signs such as weakness numbness. No depression anxiety. IMM/ Allergy: No unusual infections.  Allergy . REST of 12 system review negative except as per HPI   Past Medical History:  Diagnosis Date   Abnormal finding on MRI of brain 01/10/2015   CVA (cerebral infarction) 2010   diplopia neg acute MRi whit matter changes neg LP    Elevated LFTs 11/23/2014   High blood pressure    readings   High cholesterol    Hip pain, acute 10/13/2013   Hx of  varicella    Low bone density 07/13/2018   Migraine with aura    auras   Non-cardiac chest pain    hops and evaluation 2011 dr Riley Kill   Osteoporosis 07/13/2018   Other and unspecified hyperlipidemia 04/27/2012   Has been on meds for a number of years .  Low dose crestor .  Baseline reported as 280/    PFO (patent foramen ovale)    Moderate size   Pneumonia 2007   and flu hospitalized    Primary biliary cholangitis (HCC) 04/08/2018   Sjogren's disease (HCC)    Stroke (HCC)    on hrt double vision- possible blood clot on brain; Dr. Lesia Sago at [facility]    Thyroid disease     Past Surgical History:  Procedure Laterality Date   BREAST SURGERY     implants   COLONOSCOPY     FOOT SURGERY     had bunyon removed   TONSILLECTOMY     TOOTH EXTRACTION  11/30/2018   TUBAL LIGATION      Family History  Adopted: Yes  Problem Relation Age of Onset   Alcohol abuse Mother    Pancreatic cancer Mother    Liver disease Mother    Healthy Daughter    Breast cancer Maternal Aunt    Stomach cancer Neg Hx    Esophageal cancer Neg Hx  Colon cancer Neg Hx     Social History   Socioeconomic History   Marital status: Married    Spouse name: Not on file   Number of children: 1   Years of education: Not on file   Highest education level: Not on file  Occupational History   Not on file  Tobacco Use   Smoking status: Never    Passive exposure: Never   Smokeless tobacco: Never  Vaping Use   Vaping Use: Never used  Substance and Sexual Activity   Alcohol use: No   Drug use: No   Sexual activity: Yes  Other Topics Concern   Not on file  Social History Narrative   Hole between heart and lung-untreatable    Married    orig from  [place]   hh of 2   Pet dog euthanized    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    Neg tad   Some caffiene    Seatbelts no ets fa stored    G1P1   Physically active     78 yo grandaughter     Patient is left handed.      Social Determinants of Health   Financial Resource Strain: Not on file  Food Insecurity: Not on file  Transportation Needs: Not on file  Physical Activity: Not on file  Stress: Not on file  Social Connections: Not on file    Outpatient Medications Prior to Visit  Medication Sig Dispense Refill   alendronate (FOSAMAX) 70 MG tablet TAKE 1 TABLET (70 MG TOTAL) ONCE A WEEK. TAKE WITH A FULL GLASS OF WATER ON AN EMPTY STOMACH. 12 tablet 3   aspirin 81 MG tablet Take 81 mg by mouth daily.     atorvastatin (LIPITOR) 20 MG tablet TAKE 1 TABLET (20 MG TOTAL) BY MOUTH DAILY. *APPOINTMENT REQUIRED FOR REFILLS 30 tablet 1   benazepril (LOTENSIN) 5 MG tablet TAKE 1 TABLET (5 MG TOTAL) BY MOUTH DAILY. 90 tablet 1   levothyroxine (SYNTHROID) 88 MCG tablet TAKE 1 TABLET (88 MCG TOTAL) BY MOUTH DAILY. DOSAGE CHANGE 90 tablet 1   pilocarpine (SALAGEN) 5 MG tablet TAKE 1 TABLET BY MOUTH TWICE A DAY 180 tablet 0   ursodiol (ACTIGALL) 250 MG tablet TAKE 1 TABLET BY MOUTH TWICE A DAY 180 tablet 1   lisinopril (ZESTRIL) 10 MG tablet Take 1 tablet (10 mg total) by mouth daily. (Patient not taking: Reported on 09/04/2022) 90 tablet 3   VITAMIN D PO Take by mouth daily. (Patient not taking: Reported on 09/04/2022)     No facility-administered medications prior to visit.     EXAM:  BP (!) 142/86 (BP Location: Left Arm, Cuff Size: Normal)   Pulse 61   Temp 97.9 F (36.6 C) (Oral)   Ht 5' 4.5" (1.638 m)   Wt 124 lb 12.8 oz (56.6 kg)   LMP 04/25/2009   SpO2 97%   BMI 21.09 kg/m   Body mass index is 21.09 kg/m. Wt Readings from Last 3 Encounters:  09/04/22 124 lb 12.8 oz (56.6 kg)  03/11/22 126 lb 12.8 oz (57.5 kg)  03/06/22 125 lb (56.7 kg)    Physical Exam: Vital signs reviewed CBJ:SEGB is a well-developed well-nourished alert cooperative    who appearsr stated age in no acute distress.  HEENT: normocephalic atraumatic , Eyes: PERRL EOM's full, conjunctiva clear,  Nares: paten,t no deformity discharge or tenderness., Ears:  no deformity EAC's clear TMs with normal landmarks. NECK: supple without masses, thyromegaly or bruits. CHEST/PULM:  Clear to auscultation and percussion breath sounds equal no wheeze , rales or rhonchi. . Breast: normal by inspection . No dimpling, discharge, masses, tenderness or discharge . CV: PMI is nondisplaced, S1 S2 no gallops, murmurs, rubs. Peripheral pulses are present without delay.No JVD .  ABDOMEN: Bowel sounds normal nontender  No guard or rebound, no hepato splenomegal no CVA tenderness.   Extremtities:  No clubbing cyanosis or edema, no acute joint swelling or redness no focal atrophy NEURO:  Oriented x3, cranial nerves 3-12 appear to be intact, no obvious focal weakness,gait within normal limits no abnormal reflexes or asymmetrical SKIN: No acute rashes normal turgor, color, no bruising or petechiae. PSYCH: Oriented, good eye contact, no obvious depression anxiety, cognition and judgment appear normal. LN: no cervical or axillary adenopathy  Lab Results  Component Value Date   WBC 5.3 09/04/2022   HGB 12.5 09/04/2022   HCT 36.9 09/04/2022   PLT 190.0 09/04/2022   GLUCOSE 84 09/04/2022   CHOL 154 09/04/2022   TRIG 61.0 09/04/2022   HDL 76.20 09/04/2022   LDLCALC 65 09/04/2022   ALT 20 09/04/2022   AST 26 09/04/2022   NA 138 09/04/2022   K 4.8 09/04/2022   CL 105 09/04/2022   CREATININE 0.98 09/04/2022   BUN 12 09/04/2022   CO2 28 09/04/2022   TSH 3.19 09/04/2022   INR 1.0 02/16/2020   HGBA1C 5.6 06/12/2021    BP Readings from Last 3 Encounters:  09/04/22 (!) 142/86  03/11/22 130/84  03/06/22 (!) 162/101   Lab Results  Component Value Date   VITAMINB12 275 09/04/2022     Lab plan  reviewed with patient   ASSESSMENT AND PLAN:  Discussed the following assessment and plan:    ICD-10-CM   1. Visit for preventive health examination  Z00.00 Lipid panel    Basic metabolic panel    CBC with  Differential/Platelet    Hepatic function panel    TSH    Vitamin B12    Vitamin B12    TSH    Hepatic function panel    CBC with Differential/Platelet    Basic metabolic panel    Lipid panel    2. Hypothyroidism, unspecified type  E03.9 Lipid panel    Basic metabolic panel    CBC with Differential/Platelet    Hepatic function panel    TSH    Vitamin B12    Vitamin B12    TSH    Hepatic function panel    CBC with Differential/Platelet    Basic metabolic panel    Lipid panel    3. Essential hypertension  I10 Lipid panel    Basic metabolic panel    CBC with Differential/Platelet    Hepatic function panel    TSH    Vitamin B12    Vitamin B12    TSH    Hepatic function panel    CBC with Differential/Platelet    Basic metabolic panel    Lipid panel    4. Osteoporosis without current pathological fracture, unspecified osteoporosis type  M81.0 Lipid panel    Basic metabolic panel    CBC with Differential/Platelet    Hepatic function panel    TSH    Vitamin B12    Vitamin B12    TSH    Hepatic function panel    CBC with Differential/Platelet    Basic metabolic panel  Lipid panel    5. Sjogren's syndrome, with unspecified organ involvement (HCC)  M35.00 Lipid panel    Basic metabolic panel    CBC with Differential/Platelet    Hepatic function panel    TSH    Vitamin B12    Vitamin B12    TSH    Hepatic function panel    CBC with Differential/Platelet    Basic metabolic panel    Lipid panel    6. Primary biliary cholangitis (HCC)  K74.3 Lipid panel    Basic metabolic panel    CBC with Differential/Platelet    Hepatic function panel    TSH    Vitamin B12    Vitamin B12    TSH    Hepatic function panel    CBC with Differential/Platelet    Basic metabolic panel    Lipid panel    7. Vitamin D deficiency  E55.9 Lipid panel    Basic metabolic panel    CBC with Differential/Platelet    Hepatic function panel    TSH    Vitamin B12    Vitamin B12     TSH    Hepatic function panel    CBC with Differential/Platelet    Basic metabolic panel    Lipid panel     Lab monitoring  Bp control discussed  Flu vaccine advised  Last dexa 2022  Disc rational about bone building med for osteoporosis  Return in about 1 year (around 09/05/2023) for depending on results and BP readings .  Patient Care Team: Chasyn Cinque, Neta Mends, MD as PCP - General (Internal Medicine) Olivia Mackie, MD as Attending Physician (Obstetrics and Gynecology) York Spaniel, MD (Inactive) as Consulting Physician (Neurology) Pollyann Savoy, MD as Consulting Physician (Rheumatology) Iva Boop, MD as Consulting Physician (Gastroenterology) Patient Instructions  Good to see you today . Want to make sure Bp is in range and controlled   below 140/90 average  best 130 range .   Please bring your blood pressure cuff to next appointment any were to be sure   correlates with office  machines .  Take blood pressure readings twice a day for about 5 days and record .     Take 2 -3 readings at each sitting .   Can send in readings  by My Chart.  We may need to adjust medication    Before checking your blood pressure make sure: You are seated and quite for 5 min before checking Feet are flat on the floor Siting in chair with your back supported straight up and down Arm resting on table or arm of chair at heart level Bladder is empty You have NOT had caffeine or tobacco within the last 30 min  [email or web address]  UPdate labs today    Consider updated dexa scan next year . ( Last one was 2022)  unless your specialists advise otherwise .  Stay on fosamax for now    Neta Mends. Karmela Bram M.D.

## 2022-09-04 ENCOUNTER — Ambulatory Visit (INDEPENDENT_AMBULATORY_CARE_PROVIDER_SITE_OTHER): Payer: BC Managed Care – PPO | Admitting: Internal Medicine

## 2022-09-04 ENCOUNTER — Encounter: Payer: Self-pay | Admitting: Internal Medicine

## 2022-09-04 VITALS — BP 142/86 | HR 61 | Temp 97.9°F | Ht 64.5 in | Wt 124.8 lb

## 2022-09-04 DIAGNOSIS — Z0000 Encounter for general adult medical examination without abnormal findings: Secondary | ICD-10-CM | POA: Diagnosis not present

## 2022-09-04 DIAGNOSIS — M3500 Sicca syndrome, unspecified: Secondary | ICD-10-CM | POA: Diagnosis not present

## 2022-09-04 DIAGNOSIS — K743 Primary biliary cirrhosis: Secondary | ICD-10-CM

## 2022-09-04 DIAGNOSIS — M810 Age-related osteoporosis without current pathological fracture: Secondary | ICD-10-CM

## 2022-09-04 DIAGNOSIS — I10 Essential (primary) hypertension: Secondary | ICD-10-CM | POA: Diagnosis not present

## 2022-09-04 DIAGNOSIS — E039 Hypothyroidism, unspecified: Secondary | ICD-10-CM

## 2022-09-04 DIAGNOSIS — E559 Vitamin D deficiency, unspecified: Secondary | ICD-10-CM

## 2022-09-04 LAB — HEPATIC FUNCTION PANEL
ALT: 20 U/L (ref 0–35)
AST: 26 U/L (ref 0–37)
Albumin: 4.1 g/dL (ref 3.5–5.2)
Alkaline Phosphatase: 85 U/L (ref 39–117)
Bilirubin, Direct: 0.1 mg/dL (ref 0.0–0.3)
Total Bilirubin: 0.4 mg/dL (ref 0.2–1.2)
Total Protein: 7.3 g/dL (ref 6.0–8.3)

## 2022-09-04 LAB — CBC WITH DIFFERENTIAL/PLATELET
Basophils Absolute: 0 10*3/uL (ref 0.0–0.1)
Basophils Relative: 0.9 % (ref 0.0–3.0)
Eosinophils Absolute: 0.2 10*3/uL (ref 0.0–0.7)
Eosinophils Relative: 4.1 % (ref 0.0–5.0)
HCT: 36.9 % (ref 36.0–46.0)
Hemoglobin: 12.5 g/dL (ref 12.0–15.0)
Lymphocytes Relative: 18.2 % (ref 12.0–46.0)
Lymphs Abs: 1 10*3/uL (ref 0.7–4.0)
MCHC: 33.8 g/dL (ref 30.0–36.0)
MCV: 92.2 fl (ref 78.0–100.0)
Monocytes Absolute: 0.6 10*3/uL (ref 0.1–1.0)
Monocytes Relative: 11 % (ref 3.0–12.0)
Neutro Abs: 3.5 10*3/uL (ref 1.4–7.7)
Neutrophils Relative %: 65.8 % (ref 43.0–77.0)
Platelets: 190 10*3/uL (ref 150.0–400.0)
RBC: 4 Mil/uL (ref 3.87–5.11)
RDW: 13.9 % (ref 11.5–15.5)
WBC: 5.3 10*3/uL (ref 4.0–10.5)

## 2022-09-04 LAB — BASIC METABOLIC PANEL
BUN: 12 mg/dL (ref 6–23)
CO2: 28 mEq/L (ref 19–32)
Calcium: 10.2 mg/dL (ref 8.4–10.5)
Chloride: 105 mEq/L (ref 96–112)
Creatinine, Ser: 0.98 mg/dL (ref 0.40–1.20)
GFR: 59.52 mL/min — ABNORMAL LOW (ref >60.00)
Glucose, Bld: 84 mg/dL (ref 70–99)
Potassium: 4.8 mEq/L (ref 3.5–5.1)
Sodium: 138 mEq/L (ref 135–145)

## 2022-09-04 LAB — LIPID PANEL
Cholesterol: 154 mg/dL (ref 0–200)
HDL: 76.2 mg/dL (ref >39.00)
LDL Cholesterol: 65 mg/dL (ref 0–99)
NonHDL: 77.56
Total CHOL/HDL Ratio: 2
Triglycerides: 61 mg/dL (ref 0.0–149.0)
VLDL: 12.2 mg/dL (ref 0.0–40.0)

## 2022-09-04 LAB — VITAMIN B12: Vitamin B-12: 275 pg/mL (ref 211–911)

## 2022-09-04 LAB — TSH: TSH: 3.19 u[IU]/mL (ref 0.35–5.50)

## 2022-09-04 NOTE — Patient Instructions (Addendum)
Good to see you today . Want to make sure Bp is in range and controlled   below 140/90 average  best 130 range .   Please bring your blood pressure cuff to next appointment any were to be sure   correlates with office  machines .  Take blood pressure readings twice a day for about 5 days and record .     Take 2 -3 readings at each sitting .   Can send in readings  by My Chart.  We may need to adjust medication    Before checking your blood pressure make sure: You are seated and quite for 5 min before checking Feet are flat on the floor Siting in chair with your back supported straight up and down Arm resting on table or arm of chair at heart level Bladder is empty You have NOT had caffeine or tobacco within the last 30 min  [email or web address]  UPdate labs today    Consider updated dexa scan next year . ( Last one was 2022)  unless your specialists advise otherwise .  Stay on fosamax for now

## 2022-09-05 ENCOUNTER — Encounter: Payer: Self-pay | Admitting: Internal Medicine

## 2022-09-08 ENCOUNTER — Encounter: Payer: Self-pay | Admitting: Internal Medicine

## 2022-09-08 NOTE — Progress Notes (Signed)
Creatinine gfr borderline low   ( kidney function)  B12 is  low normal   please take a b12 supplement  1000 mcg per day should be enough.  Dissolvable can be be better  absorbed so look for these if possible.( Pills ok otherwise)  Thyroid  cholesterol  blood count normal .  Need Fu of your Bp reading as we discussed ( uncontrolled bp can effect kidneys)   In 3-4 weeks   check BMP and urine microalbuminuria creatinine ratio that will recheck gfr and renal function.

## 2022-09-10 ENCOUNTER — Encounter: Payer: Self-pay | Admitting: Internal Medicine

## 2022-09-11 NOTE — Progress Notes (Signed)
Office Visit Note  Patient: Dana [name]             Date of Birth: 01/02/1956           MRN: [id number]             PCP: Burnis Medin, MD Referring: Burnis Medin, MD Visit Date: 09/25/2022 Occupation: @GUAROCC @  Subjective:  Dry mouth and dry eyes  History of Present Illness: Dana [name] is a 66 y.o. female with history of Sjogren's syndrome and inflammatory arthritis.  She states she continues to have dry mouth and dry eyes symptoms.  She states the pilocarpine 5 mg p.o. twice daily has been working well.  She denies any history of oral ulcers, nasal ulcers, malar rash, photosensitivity, Raynaud's phenomenon or lymphadenopathy.  She has off-and-on stiffness in her joints but she denies any joint swelling.  Activities of Daily Living:  Patient reports morning stiffness for 0 minutes.   Patient Denies nocturnal pain.  Difficulty dressing/grooming: Denies Difficulty climbing stairs: Denies Difficulty getting out of chair: Denies Difficulty using hands for taps, buttons, cutlery, and/or writing: Denies  Review of Systems  Constitutional:  Negative for fatigue.  HENT:  Positive for mouth dryness. Negative for mouth sores.   Eyes:  Positive for dryness.  Respiratory:  Negative for shortness of breath.   Cardiovascular:  Negative for chest pain and palpitations.  Gastrointestinal:  Negative for blood in stool, constipation and diarrhea.  Endocrine: Negative for increased urination.  Genitourinary:  Negative for involuntary urination.  Musculoskeletal:  Negative for joint pain, gait problem, joint pain, joint swelling, myalgias, muscle weakness, morning stiffness, muscle tenderness and myalgias.  Skin:  Negative for color change, rash, hair loss and sensitivity to sunlight.  Allergic/Immunologic: Negative for susceptible to infections.  Neurological:  Negative for dizziness and headaches.  Hematological:  Negative for swollen glands.  Psychiatric/Behavioral:  Negative for  depressed mood and sleep disturbance. The patient is not nervous/anxious.     PMFS History:  Patient Active Problem List   Diagnosis Date Noted   Abnormal gallbladder ultrasound - slightly thick wall 01/25/2022   Elevated blood pressure reading without diagnosis of hypertension 02/16/2020   Onychomycosis 08/05/2018   Sjogren's disease ([place]) 07/13/2018   Osteoporosis 07/13/2018   Vitamin D deficiency 07/06/2018   Primary biliary cholangitis ([place]) 04/08/2018   Tetanus, diphtheria, and acellular pertussis (Tdap) vaccination declined 04/08/2018   Trigger point of right shoulder region 03/24/2018   Slipped rib syndrome 03/19/2018   Nonallopathic lesion of rib cage 03/19/2018   Nonallopathic lesion of thoracic region 03/19/2018   Nonallopathic lesion of cervical region 03/19/2018   Low serum vitamin D 04/09/2016   PFO (patent foramen ovale)    Abnormal finding on MRI of brain 01/10/2015   Visit for preventive health examination 11/24/2014   Bilateral arm weakness  hx of  11/23/2014   Essential hypertension 11/23/2014   Left paraspinal back pain 10/13/2013   Chronic constipation 09/16/2013   ESR raised 08/23/2013   Menopausal hot flushes 04/27/2012   Other and unspecified hyperlipidemia 04/27/2012   Migraine with aura    Hyperlipidemia 01/03/2012   Hypothyroidism 03/21/2010   HYPERTENSION, UNSPECIFIED 03/21/2010   CEREBROVASCULAR ACCIDENT, HX OF 03/21/2010    Past Medical History:  Diagnosis Date   Abnormal finding on MRI of brain 01/10/2015   CVA (cerebral infarction) 2010   diplopia neg acute MRi whit matter changes neg LP    Elevated LFTs 11/23/2014   High blood pressure  readings   High cholesterol    Hip pain, acute 10/13/2013   Hx of varicella    Low bone density 07/13/2018   Migraine with aura    auras   Non-cardiac chest pain    hops and evaluation 2011 dr Lia Foyer   Osteoporosis 07/13/2018   Other and unspecified hyperlipidemia 04/27/2012   Has been on meds for a  number of years .  Low dose crestor .  Baseline reported as 280/    PFO (patent foramen ovale)    Moderate size   Pneumonia 2007   and flu hospitalized    Primary biliary cholangitis ([place]) 04/08/2018   Sjogren's disease ([place])    Stroke ([place])    on hrt double vision- possible blood clot on brain; Dr. Floyde Parkins at [facility]    Thyroid disease     Family History  Adopted: Yes  Problem Relation Age of Onset   Alcohol abuse Mother    Pancreatic cancer Mother    Liver disease Mother    Healthy Daughter    Breast cancer Maternal Aunt    Stomach cancer Neg Hx    Esophageal cancer Neg Hx    Colon cancer Neg Hx    Past Surgical History:  Procedure Laterality Date   BREAST SURGERY     implants   COLONOSCOPY     FOOT SURGERY     had bunyon removed   TONSILLECTOMY     TOOTH EXTRACTION  11/30/2018   TUBAL LIGATION     Social History   Social History Narrative   Hole between heart and lung-untreatable    Married    orig from  [place] of 2   Pet dog euthanized    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    Neg tad   Some caffiene    Seatbelts no ets fa stored    G1P1   Physically active     56 yo grandaughter    Patient is left handed.      Immunization History  Administered Date(s) Administered   Fluad Quad(high Dose 65+) 09/10/2019, 11/14/2020   Influenza Split 09/08/2012   Influenza,inj,Quad PF,6+ Mos 08/23/2013, 11/16/2014, 09/07/2015, 08/23/2016, 09/02/2017, 09/08/2018   Moderna Sars-Covid-2 Vaccination 07/17/2020, 08/14/2020   Pneumococcal Conjugate-13 04/26/2011, 09/10/2019   Pneumococcal Polysaccharide-23 06/12/2021   Zoster Recombinat (Shingrix) 06/12/2021, 09/12/2021     Objective: Vital Signs: BP (!) 148/88 (BP Location: Left Arm, Patient Position: Sitting, Cuff Size: Normal)   Pulse (!) 57   Resp 16   Ht 5' 6"  (1.676 m)   Wt 126 lb 6.4 oz (57.3 kg)   LMP 04/25/2009   BMI 20.40 kg/m     Physical Exam Vitals and nursing note reviewed.  Constitutional:      Appearance: She is well-developed.  HENT:     Head: Normocephalic and atraumatic.  Eyes:     Conjunctiva/sclera: Conjunctivae normal.  Cardiovascular:     Rate and Rhythm: Normal rate and regular rhythm.     Heart sounds: Normal heart sounds.  Pulmonary:     Effort: Pulmonary effort is normal.     Breath sounds: Normal breath sounds.  Abdominal:     General: Bowel sounds are normal.     Palpations: Abdomen is soft.  Musculoskeletal:     Cervical back: Normal range of motion.  Lymphadenopathy:     Cervical: No cervical adenopathy.  Skin:  General: Skin is warm and dry.     Capillary Refill: Capillary refill takes less than 2 seconds.  Neurological:     Mental Status: She is alert and oriented to person, place, and time.  Psychiatric:        Behavior: Behavior normal.      Musculoskeletal Exam: Cervical spine was in good range of motion.  Shoulder joints, elbow joints, wrist joints, MCPs PIPs and DIPs been good range of motion with no synovitis.  Hip joints and knee joints with good range of motion without any warmth swelling or effusion.  There was no tenderness over ankles or MTPs.  CDAI Exam: CDAI Score: -- Patient Global: --; Provider Global: -- Swollen: --; Tender: -- Joint Exam 09/25/2022   No joint exam has been documented for this visit   There is currently no information documented on the homunculus. Go to the Rheumatology activity and complete the homunculus joint exam.  Investigation: No additional findings.  Imaging: No results found.  Recent Labs: Lab Results  Component Value Date   WBC 5.3 09/04/2022   HGB 12.5 09/04/2022   PLT 190.0 09/04/2022   NA 138 09/04/2022   K 4.8 09/04/2022   CL 105 09/04/2022   CO2 28 09/04/2022   GLUCOSE 84 09/04/2022   BUN 12 09/04/2022   CREATININE 0.98 09/04/2022   BILITOT 0.4 09/04/2022   ALKPHOS 85 09/04/2022   AST 26 09/04/2022    ALT 20 09/04/2022   PROT 7.3 09/04/2022   ALBUMIN 4.1 09/04/2022   CALCIUM 10.2 09/04/2022   GFRAA 78 01/11/2021   QFTBGOLDPLUS NEGATIVE 03/06/2022    Speciality Comments: PLQ Eye Exam: 09/25/18 WNL @ [facility] follow up 1 year  Procedures:  No procedures performed Allergies: Estrogens, Codeine, Ibuprofen, and Sulfa antibiotics   Assessment / Plan:     Visit Diagnoses: Polyarthritis-patient presented April with the inflammatory arthritis involving multiple joints.  She declined knee joint aspiration at that time and was given a prednisone taper.  She states the symptoms resolved after the prednisone taper without any recurrence of joint swelling.  I advised her to contact us if she develops joint swelling.  Sjogren's syndrome with keratoconjunctivitis sicca (HCC) - ANA+, Ro+: She continues to take pilocarpine for dry mouth and dry eye symptoms.  Over-the-counter products were discussed.  She was given hydroxychloroquine in the past which she discontinued due to tinnitus and constipation.  High risk medication use - She was started on PLQ in October 2019 but discontinued after 2 weeks due to developing tinnitus and constipation.  Positive ANA (antinuclear antibody) - August 11, 2018 AVISE index 0.2, ANA 1: 5120 homogeneous, SSA positive, dsDNA negative, Smith negative, RNP negative, SSB Negative, SCL 70 is negative:   Primary biliary cholangitis ([place]) - followed by Dr. Carlean Purl.   Osteopenia of both hips - DEXA on 07/06/2018 revealed RFN T score of -2.4 and LFN -2.0.  She is currently on alendronate 70 mg p.o. weekly.  DEXA is followed by her PCP.  I am uncertain how long patient has been on Fosamax.  I discussed to her coming off Fosamax if its been more than 5 years.  Drug holiday is necessary to prevent atypical fractures.  If her bone density has not improved then she may need some other therapy.  Vitamin D deficiency-her vitamin D was normal on 47.09 on January 16, 2022.  Other medical problems are listed as follows:  Patent foramen ovale  History of hyperlipidemia  History of  stroke  History of hypothyroidism  Orders: No orders of the defined types were placed in this encounter.  No orders of the defined types were placed in this encounter.    Follow-Up Instructions: Return in about 6 months (around 03/26/2023) for Sjogren's.   Bo Merino, MD  Note - This record has been created using Editor, commissioning.  Chart creation errors have been sought, but may not always  have been located. Such creation errors do not reflect on  the standard of medical care.

## 2022-09-11 NOTE — Telephone Encounter (Signed)
Bp  readings ok . Please update data in record as controlled

## 2022-09-25 ENCOUNTER — Ambulatory Visit: Payer: BC Managed Care – PPO | Attending: Rheumatology | Admitting: Rheumatology

## 2022-09-25 ENCOUNTER — Encounter: Payer: Self-pay | Admitting: Rheumatology

## 2022-09-25 VITALS — BP 148/88 | HR 57 | Resp 16 | Ht 66.0 in | Wt 126.4 lb

## 2022-09-25 DIAGNOSIS — M130 Polyarthritis, unspecified: Secondary | ICD-10-CM

## 2022-09-25 DIAGNOSIS — E559 Vitamin D deficiency, unspecified: Secondary | ICD-10-CM

## 2022-09-25 DIAGNOSIS — Z8673 Personal history of transient ischemic attack (TIA), and cerebral infarction without residual deficits: Secondary | ICD-10-CM

## 2022-09-25 DIAGNOSIS — Q2112 Patent foramen ovale: Secondary | ICD-10-CM

## 2022-09-25 DIAGNOSIS — M3501 Sicca syndrome with keratoconjunctivitis: Secondary | ICD-10-CM

## 2022-09-25 DIAGNOSIS — Z8639 Personal history of other endocrine, nutritional and metabolic disease: Secondary | ICD-10-CM

## 2022-09-25 DIAGNOSIS — Z79899 Other long term (current) drug therapy: Secondary | ICD-10-CM | POA: Diagnosis not present

## 2022-09-25 DIAGNOSIS — M85851 Other specified disorders of bone density and structure, right thigh: Secondary | ICD-10-CM

## 2022-09-25 DIAGNOSIS — K743 Primary biliary cirrhosis: Secondary | ICD-10-CM

## 2022-09-25 DIAGNOSIS — R7689 Other specified abnormal immunological findings in serum: Secondary | ICD-10-CM

## 2022-09-25 DIAGNOSIS — R768 Other specified abnormal immunological findings in serum: Secondary | ICD-10-CM

## 2022-09-25 DIAGNOSIS — M85852 Other specified disorders of bone density and structure, left thigh: Secondary | ICD-10-CM

## 2022-10-07 ENCOUNTER — Encounter: Payer: Self-pay | Admitting: Internal Medicine

## 2022-10-07 ENCOUNTER — Ambulatory Visit: Payer: BC Managed Care – PPO | Admitting: Internal Medicine

## 2022-10-07 VITALS — BP 114/68 | HR 60 | Temp 97.8°F | Wt 125.2 lb

## 2022-10-07 DIAGNOSIS — R198 Other specified symptoms and signs involving the digestive system and abdomen: Secondary | ICD-10-CM | POA: Diagnosis not present

## 2022-10-07 DIAGNOSIS — I10 Essential (primary) hypertension: Secondary | ICD-10-CM

## 2022-10-07 DIAGNOSIS — R944 Abnormal results of kidney function studies: Secondary | ICD-10-CM | POA: Diagnosis not present

## 2022-10-07 LAB — BASIC METABOLIC PANEL
BUN: 15 mg/dL (ref 6–23)
CO2: 26 mEq/L (ref 19–32)
Calcium: 9.9 mg/dL (ref 8.4–10.5)
Chloride: 104 mEq/L (ref 96–112)
Creatinine, Ser: 1.01 mg/dL (ref 0.40–1.20)
GFR: 57.36 mL/min — ABNORMAL LOW (ref >60.00)
Glucose, Bld: 78 mg/dL (ref 70–99)
Potassium: 4.8 mEq/L (ref 3.5–5.1)
Sodium: 137 mEq/L (ref 135–145)

## 2022-10-07 NOTE — Progress Notes (Signed)
[facility] 

## 2022-10-07 NOTE — Patient Instructions (Signed)
Lab today  To check kidney function.  Check with Gi team   about constipation  sx  If  persistent or progressive  Get with the Gi team.   Best that BP is in control.

## 2022-10-07 NOTE — Progress Notes (Signed)
Chief Complaint  Patient presents with   Follow-up    On BP. Patient reports her BP has been good at home. Did not bring BP reading log. Patient has a concerns on GFR from her lab result and would like to speak with provider.     HPI: Dana [name] 66 y.o. come in for follow-up of blood pressure and question about slight decrease in GFR to 59 on her last lab test. She states her blood pressure at home is quite controlled in the 1 teens 120 range.  She had episode of the epigastric pain and bloating feeling after eating out but then it continued for a while and just improved today.  She had decreased her greasy foods and that may have been helpful.  No vomiting but has some chronic constipation thought about taking a gummy for constipation.  No fever or otherwise change in bowel habits.  ROS: See pertinent positives and negatives per HPI.  Past Medical History:  Diagnosis Date   Abnormal finding on MRI of brain 01/10/2015   CVA (cerebral infarction) 2010   diplopia neg acute MRi whit matter changes neg LP    Elevated LFTs 11/23/2014   High blood pressure    readings   High cholesterol    Hip pain, acute 10/13/2013   Hx of varicella    Low bone density 07/13/2018   Migraine with aura    auras   Non-cardiac chest pain    hops and evaluation 2011 dr Riley Kill   Osteoporosis 07/13/2018   Other and unspecified hyperlipidemia 04/27/2012   Has been on meds for a number of years .  Low dose crestor .  Baseline reported as 280/    PFO (patent foramen ovale)    Moderate size   Pneumonia 2007   and flu hospitalized    Primary biliary cholangitis (HCC) 04/08/2018   Sjogren's disease (HCC)    Stroke (HCC)    on hrt double vision- possible blood clot on brain; Dr. Lesia Sago at [facility]    Thyroid disease     Family History  Adopted: Yes  Problem Relation Age of Onset   Alcohol abuse Mother    Pancreatic cancer Mother    Liver disease Mother    Healthy Daughter     Breast cancer Maternal Aunt    Stomach cancer Neg Hx    Esophageal cancer Neg Hx    Colon cancer Neg Hx     Social History   Socioeconomic History   Marital status: Married    Spouse name: Not on file   Number of children: 1   Years of education: Not on file   Highest education level: Not on file  Occupational History   Not on file  Tobacco Use   Smoking status: Never    Passive exposure: Never   Smokeless tobacco: Never  Vaping Use   Vaping Use: Never used  Substance and Sexual Activity   Alcohol use: No   Drug use: No   Sexual activity: Yes  Other Topics Concern   Not on file  Social History Narrative   Hole between heart and lung-untreatable    Married    orig from  [place]   hh of 2   Pet dog euthanized    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    Neg tad   Some caffiene    Seatbelts no ets  fa stored    G1P1   Physically active     5 yo grandaughter    Patient is left handed.      Social Determinants of Health   Financial Resource Strain: Not on file  Food Insecurity: Not on file  Transportation Needs: Not on file  Physical Activity: Not on file  Stress: Not on file  Social Connections: Not on file    Outpatient Medications Prior to Visit  Medication Sig Dispense Refill   alendronate (FOSAMAX) 70 MG tablet TAKE 1 TABLET (70 MG TOTAL) ONCE A WEEK. TAKE WITH A FULL GLASS OF WATER ON AN EMPTY STOMACH. 12 tablet 3   aspirin 81 MG tablet Take 81 mg by mouth daily.     atorvastatin (LIPITOR) 20 MG tablet TAKE 1 TABLET (20 MG TOTAL) BY MOUTH DAILY. *APPOINTMENT REQUIRED FOR REFILLS 30 tablet 1   benazepril (LOTENSIN) 5 MG tablet TAKE 1 TABLET (5 MG TOTAL) BY MOUTH DAILY. 90 tablet 1   levothyroxine (SYNTHROID) 88 MCG tablet TAKE 1 TABLET (88 MCG TOTAL) BY MOUTH DAILY. DOSAGE CHANGE 90 tablet 1   pilocarpine (SALAGEN) 5 MG tablet TAKE 1 TABLET BY MOUTH TWICE A DAY 180 tablet 0   ursodiol (ACTIGALL) 250 MG tablet  TAKE 1 TABLET BY MOUTH TWICE A DAY 180 tablet 1   lisinopril (ZESTRIL) 10 MG tablet Take 1 tablet (10 mg total) by mouth daily. (Patient not taking: Reported on 09/04/2022) 90 tablet 3   VITAMIN D PO Take by mouth daily. (Patient not taking: Reported on 09/25/2022)     No facility-administered medications prior to visit.     EXAM:  BP 114/68 (BP Location: Right Arm, Patient Position: Sitting, Cuff Size: Normal)   Pulse 60   Temp 97.8 F (36.6 C) (Oral)   Wt 125 lb 3.2 oz (56.8 kg)   LMP 04/25/2009   SpO2 96%   BMI 20.21 kg/m   Body mass index is 20.21 kg/m.  GENERAL: vitals reviewed and listed above, alert, oriented, appears well hydrated and in no acute distress HEENT: atraumatic, conjunctiva  clear, no obvious abnormalities on inspection of external nose and ears MS: moves all extremities without noticeable focal  abnormality PSYCH: pleasant and cooperative, no obvious depression or anxiety Lab Results  Component Value Date   WBC 5.3 09/04/2022   HGB 12.5 09/04/2022   HCT 36.9 09/04/2022   PLT 190.0 09/04/2022   GLUCOSE 84 09/04/2022   CHOL 154 09/04/2022   TRIG 61.0 09/04/2022   HDL 76.20 09/04/2022   LDLCALC 65 09/04/2022   ALT 20 09/04/2022   AST 26 09/04/2022   NA 138 09/04/2022   K 4.8 09/04/2022   CL 105 09/04/2022   CREATININE 0.98 09/04/2022   BUN 12 09/04/2022   CO2 28 09/04/2022   TSH 3.19 09/04/2022   INR 1.0 02/16/2020   HGBA1C 5.6 06/12/2021   BP Readings from Last 3 Encounters:  10/07/22 114/68  09/25/22 (!) 148/88  09/04/22 (!) 142/86   Previous lab results reviewed calculated GFR etc. ASSESSMENT AND PLAN:  Discussed the following assessment and plan:  Essential hypertension - Plan: Basic Metabolic Panel, Microalbumin/Creatinine Ratio, Urine, Microalbumin/Creatinine Ratio, Urine, Basic Metabolic Panel  Decreased calculated GFR - Plan: Basic Metabolic Panel, Microalbumin/Creatinine Ratio, Urine, Microalbumin/Creatinine Ratio, Urine, Basic  Metabolic Panel  GI symptoms - improved She is not taking NSAIDs on a regular basis her high risk medicine renal toxic.  Nephrotoxic.  BMP and urine microalbumin today. In regard to GI symptoms  are improved this could be dietary related however for if persistent progressive and recurrent would have her contact the GI team about her symptoms.  In the meantime avoiding greasy foods but otherwise eat normal. -Patient advised to return or notify health care team  if  new concerns arise.  Patient Instructions  Lab today  To check kidney function.  Check with Gi team   about constipation  sx  If  persistent or progressive  Get with the Gi team.   Best that BP is in control.    Neta Mends. Dana [name] M.D.

## 2022-10-08 LAB — MICROALBUMIN / CREATININE URINE RATIO
Creatinine,U: 96.7 mg/dL
Microalb Creat Ratio: 0.7 mg/g (ref 0.0–30.0)
Microalb, Ur: <0.7 mg/dL (ref 0.0–1.9)

## 2022-10-08 NOTE — Progress Notes (Signed)
Kidney function is the same  .  No protein in  urine which is favorable . Lets follow and  repeat creatinine and gfr in  6  months  if not done elsewhere by your other specialists

## 2022-10-10 ENCOUNTER — Other Ambulatory Visit: Payer: Self-pay | Admitting: Internal Medicine

## 2022-10-10 ENCOUNTER — Other Ambulatory Visit: Payer: Self-pay | Admitting: Rheumatology

## 2022-11-06 ENCOUNTER — Other Ambulatory Visit: Payer: Self-pay | Admitting: Internal Medicine

## 2022-11-22 ENCOUNTER — Other Ambulatory Visit: Payer: Self-pay

## 2022-11-22 ENCOUNTER — Telehealth: Payer: Self-pay

## 2022-11-22 MED ORDER — OMEPRAZOLE 40 MG PO CPDR: 40.0000 mg | DELAYED_RELEASE_CAPSULE | Freq: Every day | ORAL | 0 refills | Status: DC

## 2022-11-22 MED ORDER — SUCRALFATE 1 G PO TABS: 1.0000 g | ORAL_TABLET | Freq: Three times a day (TID) | ORAL | 0 refills | Status: DC

## 2022-11-22 NOTE — Telephone Encounter (Signed)
Pt called and states she ate some green beans last night with raw onions on top. Reports she has pain that she rates at a 7 all night and some acid reflux that came up in her throat. She elevated her head and took reflux med but the pain continued. The pain eased up about 10am. She ate a banana sandwich on white bread and now she has the pain back in the middle right below her bra line. She wondered if she needed to go to the hospital or Dr. Leone Payor could recommend something for her to take. Please advise.

## 2022-11-22 NOTE — Telephone Encounter (Signed)
This sounds like pill esophagitis and I suspect Fosamax is the cause  1) clear liquids may take full liquids if tolerated  2) carafate 1 g tidac/HS #120 no refill  3)Omeprazole 40 mg qd # 30 no refill  4) Stop Fosamax right now  Over time (days at least) this should get better but could take a couple of weeks to resolve  She should let us know how she is doing when we reopen Tuesday - if she is worsening and cannot maintain po intake then go to ED  FYI I am off next week so let her know that when she calls if there are ?'s the doc of the day will handle  We should get her a f/u appointment with me or an APP - she should not restart Fosamax until I/we say ok

## 2022-11-22 NOTE — Telephone Encounter (Signed)
Spoke with pt and she is aware of Dr. Marvell Fuller recommendations, scripts sent to pharmacy. Pt scheduled to see Hyacinth Meeker PA 12/02/22 at 9:30am. Pt aware.

## 2022-11-24 ENCOUNTER — Telehealth: Payer: Self-pay | Admitting: Rheumatology

## 2022-11-25 ENCOUNTER — Emergency Department (HOSPITAL_BASED_OUTPATIENT_CLINIC_OR_DEPARTMENT_OTHER)
Admission: EM | Admit: 2022-11-25 | Discharge: 2022-11-25 | Disposition: A | Discharge status: Home / Self Care | Payer: BC Managed Care – PPO | Attending: Emergency Medicine | Admitting: Emergency Medicine

## 2022-11-25 ENCOUNTER — Other Ambulatory Visit: Payer: Self-pay

## 2022-11-25 ENCOUNTER — Emergency Department (HOSPITAL_BASED_OUTPATIENT_CLINIC_OR_DEPARTMENT_OTHER): Payer: BC Managed Care – PPO

## 2022-11-25 ENCOUNTER — Encounter (HOSPITAL_BASED_OUTPATIENT_CLINIC_OR_DEPARTMENT_OTHER): Payer: Self-pay

## 2022-11-25 DIAGNOSIS — K5790 Diverticulosis of intestine, part unspecified, without perforation or abscess without bleeding: Secondary | ICD-10-CM | POA: Diagnosis not present

## 2022-11-25 DIAGNOSIS — Z1152 Encounter for screening for COVID-19: Secondary | ICD-10-CM | POA: Insufficient documentation

## 2022-11-25 DIAGNOSIS — I10 Essential (primary) hypertension: Secondary | ICD-10-CM | POA: Diagnosis not present

## 2022-11-25 DIAGNOSIS — R109 Unspecified abdominal pain: Secondary | ICD-10-CM | POA: Diagnosis present

## 2022-11-25 DIAGNOSIS — R112 Nausea with vomiting, unspecified: Secondary | ICD-10-CM

## 2022-11-25 DIAGNOSIS — Z7982 Long term (current) use of aspirin: Secondary | ICD-10-CM | POA: Insufficient documentation

## 2022-11-25 DIAGNOSIS — K529 Noninfective gastroenteritis and colitis, unspecified: Secondary | ICD-10-CM | POA: Diagnosis not present

## 2022-11-25 LAB — URINALYSIS, ROUTINE W REFLEX MICROSCOPIC
Bilirubin Urine: NEGATIVE
Glucose, UA: NEGATIVE mg/dL
Hgb urine dipstick: NEGATIVE
Ketones, ur: NEGATIVE mg/dL
Leukocytes,Ua: NEGATIVE
Nitrite: NEGATIVE
Protein, ur: NEGATIVE mg/dL
Specific Gravity, Urine: <1.005 — ABNORMAL LOW (ref 1.005–1.030)
pH: 5 (ref 5.0–8.0)

## 2022-11-25 LAB — COMPREHENSIVE METABOLIC PANEL
ALT: 15 U/L (ref 0–44)
AST: 21 U/L (ref 15–41)
Albumin: 4.3 g/dL (ref 3.5–5.0)
Alkaline Phosphatase: 80 U/L (ref 38–126)
Anion gap: 11 (ref 5–15)
BUN: 17 mg/dL (ref 8–23)
CO2: 24 mmol/L (ref 22–32)
Calcium: 9.4 mg/dL (ref 8.9–10.3)
Chloride: 103 mmol/L (ref 98–111)
Creatinine, Ser: 1.05 mg/dL — ABNORMAL HIGH (ref 0.44–1.00)
GFR, Estimated: 58 mL/min — ABNORMAL LOW (ref >60)
Glucose, Bld: 85 mg/dL (ref 70–99)
Potassium: 3.9 mmol/L (ref 3.5–5.1)
Sodium: 138 mmol/L (ref 135–145)
Total Bilirubin: 0.5 mg/dL (ref 0.3–1.2)
Total Protein: 7.3 g/dL (ref 6.5–8.1)

## 2022-11-25 LAB — LIPASE, BLOOD: Lipase: 45 U/L (ref 11–51)

## 2022-11-25 LAB — CBC
HCT: 37.4 % (ref 36.0–46.0)
Hemoglobin: 12.5 g/dL (ref 12.0–15.0)
MCH: 30.9 pg (ref 26.0–34.0)
MCHC: 33.4 g/dL (ref 30.0–36.0)
MCV: 92.3 fL (ref 80.0–100.0)
Platelets: 233 10*3/uL (ref 150–400)
RBC: 4.05 MIL/uL (ref 3.87–5.11)
RDW: 13.2 % (ref 11.5–15.5)
WBC: 9.5 10*3/uL (ref 4.0–10.5)
nRBC: 0 % (ref 0.0–0.2)

## 2022-11-25 LAB — RESP PANEL BY RT-PCR (RSV, FLU A&B, COVID)  RVPGX2
Influenza A by PCR: NEGATIVE
Influenza B by PCR: NEGATIVE
Resp Syncytial Virus by PCR: NEGATIVE
SARS Coronavirus 2 by RT PCR: NEGATIVE

## 2022-11-25 LAB — LACTIC ACID, PLASMA: Lactic Acid, Venous: 0.7 mmol/L (ref 0.5–1.9)

## 2022-11-25 MED ORDER — SODIUM CHLORIDE 0.9 % IV BOLUS
1000.0000 mL | Freq: Once | INTRAVENOUS | Status: AC
  Administered 2022-11-25: 1000 mL via INTRAVENOUS

## 2022-11-25 MED ORDER — ONDANSETRON HCL 4 MG PO TABS: 4.0000 mg | ORAL_TABLET | Freq: Three times a day (TID) | ORAL | 0 refills | Status: DC | PRN

## 2022-11-25 MED ORDER — IOHEXOL 300 MG/ML  SOLN
100.0000 mL | Freq: Once | INTRAMUSCULAR | Status: AC | PRN
  Administered 2022-11-25: 100 mL via INTRAVENOUS

## 2022-11-25 MED ORDER — AMOXICILLIN-POT CLAVULANATE 875-125 MG PO TABS: 1.0000 | ORAL_TABLET | Freq: Two times a day (BID) | ORAL | 0 refills | Status: AC

## 2022-11-25 NOTE — Discharge Instructions (Signed)
Your history, exam, workup today are consistent with colitis or early diverticulitis.  I suspect this is causing her nausea, vomiting, abdominal pain, and some of the bleeding.  Given your otherwise reassuring workup we feel you are safe for discharge home with please take the antibiotics to help treat this.  Please also take the nausea medicine to help maintain hydration.  Please follow-up with your primary doctor and your GI team.  If any symptoms change or worsen acutely, please return to the nearest emergency department.

## 2022-11-25 NOTE — ED Provider Notes (Signed)
[facility] EMERGENCY DEPT Provider Note   CSN: [id number] Arrival date & time: 11/25/22  1056     History  Chief Complaint  Patient presents with   Abdominal Pain    Dana [name] is a 67 y.o. female.  The history is provided by the patient and medical records. No language interpreter was used.  Abdominal Pain Pain location:  Generalized (lower as well) Pain quality: aching   Pain radiates to:  Does not radiate Pain severity:  Mild Onset quality:  Gradual Duration:  1 day Timing:  Constant Progression:  Waxing and waning Chronicity:  New Context: not trauma   Worsened by:  Nothing Ineffective treatments:  None tried Associated symptoms: chills, diarrhea, fatigue, hematochezia, nausea and vomiting   Associated symptoms: no chest pain, no constipation, no cough, no dysuria, no fever and no shortness of breath        Home Medications Prior to Admission medications   Medication Sig Start Date End Date Taking? Authorizing Provider  omeprazole (PRILOSEC) 40 MG capsule Take 1 capsule (40 mg total) by mouth daily. 11/22/22   Gatha Mayer, MD  sucralfate (CARAFATE) 1 g tablet Take 1 tablet (1 g total) by mouth 4 (four) times daily -  with meals and at bedtime. 11/22/22   Gatha Mayer, MD  alendronate (FOSAMAX) 70 MG tablet TAKE 1 TABLET (70 MG TOTAL) ONCE A WEEK. TAKE WITH A FULL GLASS OF WATER ON AN EMPTY STOMACH. 04/01/22   Panosh, Standley Brooking, MD  aspirin 81 MG tablet Take 81 mg by mouth daily.    [provider]  atorvastatin (LIPITOR) 20 MG tablet Take 1 tablet (20 mg total) by mouth daily. 11/07/22   Panosh, Standley Brooking, MD  benazepril (LOTENSIN) 5 MG tablet TAKE 1 TABLET (5 MG TOTAL) BY MOUTH DAILY. 08/29/22   Billie Ruddy, MD  levothyroxine (SYNTHROID) 88 MCG tablet TAKE 1 TABLET (88 MCG TOTAL) BY MOUTH DAILY. DOSAGE CHANGE 10/10/22   Panosh, Standley Brooking, MD  pilocarpine (SALAGEN) 5 MG tablet TAKE 1 TABLET BY MOUTH TWICE A DAY 08/19/22   Bo Merino, MD  ursodiol (ACTIGALL) 250 MG tablet TAKE 1 TABLET BY MOUTH TWICE A DAY 10/10/22   Gatha Mayer, MD      Allergies    Estrogens, Codeine, Ibuprofen, and Sulfa antibiotics    Review of Systems   Review of Systems  Constitutional:  Positive for chills and fatigue. Negative for diaphoresis and fever.  HENT:  Negative for congestion.   Respiratory:  Negative for cough, chest tightness, shortness of breath and wheezing.   Cardiovascular:  Negative for chest pain, palpitations and leg swelling.  Gastrointestinal:  Positive for abdominal pain, blood in stool, diarrhea, hematochezia, nausea and vomiting. Negative for abdominal distention, constipation and rectal pain.  Genitourinary:  Negative for dysuria and flank pain.  Musculoskeletal:  Negative for back pain, neck pain and neck stiffness.  Skin:  Negative for rash and wound.  Neurological:  Negative for dizziness, light-headedness and headaches.  Psychiatric/Behavioral:  Negative for agitation and confusion.   All other systems reviewed and are negative.   Physical Exam Updated Vital Signs BP 130/79   Pulse 75   Temp 98.2 F (36.8 C) (Oral)   Resp 11   Ht 5\' 6"  (1.676 m)   Wt 55.8 kg   LMP 04/25/2009   SpO2 98%   BMI 19.85 kg/m  Physical Exam Vitals and nursing note reviewed.  Constitutional:  General: She is not in acute distress.    Appearance: She is well-developed. She is not ill-appearing, toxic-appearing or diaphoretic.  HENT:     Head: Normocephalic and atraumatic.     Mouth/Throat:     Mouth: Mucous membranes are dry.  Eyes:     Extraocular Movements: Extraocular movements intact.     Conjunctiva/sclera: Conjunctivae normal.     Pupils: Pupils are equal, round, and reactive to light.  Cardiovascular:     Rate and Rhythm: Normal rate and regular rhythm.     Heart sounds: No murmur heard. Pulmonary:     Effort: Pulmonary effort is normal. No respiratory distress.     Breath sounds: Normal breath  sounds. No wheezing, rhonchi or rales.  Chest:     Chest wall: No tenderness.  Abdominal:     General: Abdomen is flat. Bowel sounds are normal. There is no distension.     Palpations: Abdomen is soft.     Tenderness: There is no abdominal tenderness. There is no right CVA tenderness, left CVA tenderness, guarding or rebound.  Genitourinary:    Comments: Refused when offered Musculoskeletal:        General: No swelling or tenderness.     Cervical back: Neck supple. No tenderness.     Right lower leg: No edema.     Left lower leg: No edema.  Skin:    General: Skin is warm and dry.     Capillary Refill: Capillary refill takes less than 2 seconds.     Findings: No erythema or rash.  Neurological:     General: No focal deficit present.     Mental Status: She is alert.     Sensory: No sensory deficit.     Motor: No weakness.  Psychiatric:        Mood and Affect: Mood normal.     ED Results / Procedures / Treatments   Labs (all labs ordered are listed, but only abnormal results are displayed) Labs Reviewed  COMPREHENSIVE METABOLIC PANEL - Abnormal; Notable for the following components:      Result Value   Creatinine, Ser 1.05 (*)    GFR, Estimated 58 (*)    All other components within normal limits  URINALYSIS, ROUTINE W REFLEX MICROSCOPIC - Abnormal; Notable for the following components:   Color, Urine COLORLESS (*)    Specific Gravity, Urine <1.005 (*)    All other components within normal limits  RESP PANEL BY RT-PCR (RSV, FLU A&B, COVID)  [id number]  URINE CULTURE  LIPASE, BLOOD  CBC  LACTIC ACID, PLASMA    EKG None  Radiology CT ABDOMEN PELVIS W CONTRAST  Result Date: 11/25/2022 CLINICAL DATA:  Left lower quadrant abdominal pain and chills. Vomiting and diarrhea. Rectal bleeding. EXAM: CT ABDOMEN AND PELVIS WITH CONTRAST TECHNIQUE: Multidetector CT imaging of the abdomen and pelvis was performed using the standard protocol following bolus administration of intravenous  contrast. RADIATION DOSE REDUCTION: This exam was performed according to the departmental dose-optimization program which includes automated exposure control, adjustment of the mA and/or kV according to patient size and/or use of iterative reconstruction technique. CONTRAST:  115mL OMNIPAQUE IOHEXOL 300 MG/ML  SOLN COMPARISON:  Abdominal ultrasound 12/10/2021. CT of the abdomen and pelvis 04/06/2013 FINDINGS: Lower chest: Minimal atelectasis is present at the left base. Lungs are otherwise clear. The heart size is normal. No significant pleural or pericardial effusion is present. Hepatobiliary: No focal liver abnormality is seen. No gallstones, gallbladder wall thickening, or biliary dilatation.  Pancreas: Unremarkable. No pancreatic ductal dilatation or surrounding inflammatory changes. Spleen: Normal in size without focal abnormality. Adrenals/Urinary Tract: Adrenal glands are normal bilaterally. Kidneys are unremarkable. No stone or mass lesion is present. No obstruction is present. Ureters are within normal limits bilaterally. The urinary bladder is normal. Stomach/Bowel: Stomach and duodenum are within normal limits. There is some stranding throughout the proximal small bowel mesentery. No focal inflammation or obstruction is present. Terminal ileum is within normal limits. The appendix is visualized and normal. The ascending and proximal transverse colon are within normal limits. More distal transverse and proximal descending colon demonstrate no significant gas. There may be some wall thickening. Descending colon is more normal in caliber. Diverticular changes are present in the sigmoid colon without focal inflammation to suggest diverticulitis. Rectosigmoid colon is within normal limits. Vascular/Lymphatic: Extensive atherosclerotic changes are present within the aorta and branch vessels. Celiac and SMA are patent. No aneurysm is present. No significant adenopathy is present. Reproductive: Uterus and  bilateral adnexa are unremarkable. Other: Small amount of free fluid is present within the anatomic pelvis. No significant ventral hernias are present. No free air is present. Musculoskeletal: Vertebral body heights and alignment are normal. Disc degenerative changes are most pronounced at L4-5 and L5-S1 with vacuum disc at both levels. Bony pelvis is within normal limits. The hips are located and within normal limits bilaterally. IMPRESSION: 1. There may be some wall thickening in the distal transverse and proximal descending colon. This may represent a nonspecific colitis. Although the vessels appear patent, extensive atherosclerotic disease is present. Correlate with lactate levels given the possibility of ischemic disease. 2. Diffuse mesenteric stranding of the small bowel. This is nonspecific and likely reactive. 3. No other specific bowel findings are present. No focal obstruction or pneumatosis. No mass lesion. 4. Sigmoid diverticulosis without diverticulitis. 5. Small amount of free fluid within the anatomic pelvis is likely reactive. 6. Degenerative changes in the lower lumbar spine are most pronounced at L4-5 and L5-S1. 7.  Aortic Atherosclerosis (ICD10-I70.0). Electronically Signed   By: San Morelle M.D.   On: 11/25/2022 12:48    Procedures Procedures    Medications Ordered in ED Medications  sodium chloride 0.9 % bolus 1,000 mL (0 mLs Intravenous Stopped 11/25/22 1509)  iohexol (OMNIPAQUE) 300 MG/ML solution 100 mL (100 mLs Intravenous Contrast Given 11/25/22 1221)    ED Course/ Medical Decision Making/ A&P                           Medical Decision Making Amount and/or Complexity of Data Reviewed Labs: ordered. Radiology: ordered.  Risk Prescription drug management.    Dana [name] is a 67 y.o. female with a past medical history significant for hypertension, hyperlipidemia, PFO, Sjogren's, previous primary biliary cholangitis, osteoporosis, and previous stroke who was  recently told she had likely pill esophagitis several days ago presents with nausea, vomiting, abdominal pain, and rectal bleeding.  According to patient, she has had symptoms since yesterday where she was having nausea vomiting and diarrhea.  She reports that some of her bowel movements were bloody after all the diarrhea.  She is unsure if this was just hemorrhoidal bleeding which she is never had before or more bleeding internally as she had some clots reported.  She says that she does not want a rectal exam as it is not hurting her right now.  She denies previous diverticulitis or diverticulosis and denies any trauma.  She says that  she ate a lot of food yesterday and thinks she "ate some but she caused it".  She said that after taking the medications from Dr. Arelia Longest with her GI doctor for the pill esophagitis she was feeling better several days ago.  She reports some intermittent chills but no fevers at home.  Denies any congestion or cough.  Unsure of sick contacts.  On my exam, lungs clear and chest nontender.  Abdomen was not focally tender but she had soreness in her lower abdomen including the left lower quadrant.  Bowel sounds were appreciated.  No flank or back tenderness.  No rash seen.  Mucous membranes were slightly dry.  She feels dehydrated.  Rectal exam was refused initially.  Due to her nausea vomiting, abdominal pain, chills, and rectal bleeding, will get CT scan to look for diverticulitis or other acute evidence of colitis.  I suspect she has either symptoms from over eating yesterday versus viral infection as well.  We will get some screening labs, give her some fluids, and get the CT scan.  We will check her for COVID, flu, and RSV given the predominance in the community.  She does not want pain or nausea medicine at this time and is feeling better.  Anticipate reassessment after workup to determine disposition.  Patient's workup did show evidence of bowel inflammation and some  colitis.  There was evidence of diverticulosis near the inflammation so we will treat with antibiotics for likely diverticulitis.  Other labs overall reassuring.  Patient was able to tolerate p.o. and is feeling better after fluids.  Patient will be discharged home for outpatient management of acute colitis causing nausea vomiting, diarrhea, and pain.  Patient will follow-up with PCP and her GI team.  She still did not want rectal exam and she was not critically anemic with the small bleeding she described.  Due to the questions or concerns and was discharged in good condition.         Final Clinical Impression(s) / ED Diagnoses Final diagnoses:  Colitis  Diverticulosis  Nausea and vomiting, unspecified vomiting type    Rx / DC Orders ED Discharge Orders          Ordered    amoxicillin-clavulanate (AUGMENTIN) 875-125 MG tablet  2 times daily        11/25/22 1510    ondansetron (ZOFRAN) 4 MG tablet  Every 8 hours PRN        11/25/22 1510            Clinical Impression: 1. Colitis   2. Diverticulosis   3. Nausea and vomiting, unspecified vomiting type     Disposition: Discharge  Condition: Good  I have discussed the results, Dx and Tx plan with the pt(& family if present). He/she/they expressed understanding and agree(s) with the plan. Discharge instructions discussed at great length. Strict return precautions discussed and pt &/or family have verbalized understanding of the instructions. No further questions at time of discharge.    New Prescriptions   AMOXICILLIN-CLAVULANATE (AUGMENTIN) 875-125 MG TABLET    Take 1 tablet by mouth 2 (two) times daily for 10 days.   ONDANSETRON (ZOFRAN) 4 MG TABLET    Take 1 tablet (4 mg total) by mouth every 8 (eight) hours as needed for nausea or vomiting.    Follow Up: Burnis Medin, MD [place] [address] [facility] [address] [place] [address] [phone]  Itza Maniaci, Gwenyth Allegra, MD 11/25/22 [phone]

## 2022-11-25 NOTE — ED Triage Notes (Signed)
Onset Thursday of epigastric pain.  Relief with Prilosec.  Yesterday developed lower abdominal pain with vomiting; diarrhea Having bridge red blood in stool.  This am having bleeding with blood clots

## 2022-11-26 ENCOUNTER — Telehealth: Payer: Self-pay | Admitting: Internal Medicine

## 2022-11-26 LAB — URINE CULTURE: Culture: <10000 — AB

## 2022-11-26 NOTE — Telephone Encounter (Signed)
Next Visit: Due May 2024. Message sent to the front to schedule.   Last Visit: 09/25/2022  Last Fill: 08/19/2022  Dx: Sjogren's syndrome with keratoconjunctivitis sicca   Current Dose per office note on 09/25/2022: continues to take pilocarpine for dry mouth and dry eye symptoms   Okay to refill Pilocarpine?

## 2022-11-26 NOTE — Telephone Encounter (Signed)
Called patient to schedule follow-up appointment in May 2024.  Patient states she went to the ER yesterday due to vomiting and rectal bleeding.  Patient states she will call back to schedule an appointment when she is feeling better.

## 2022-11-26 NOTE — Telephone Encounter (Signed)
Pt stated that she recently went to the ED with rectal bleeding along with Lower Abdominal Pain and was prescribed Augmentin and Zofran. Pt is requesting that Dr. Carlean Purl review images done and give his recommendations: Pt was notified that Dr. Carlean Purl is out of the office until next week. Pt previously scheduled to see Ellouise Newer 12/02/2022 at 9:30:  Appointment Confirmed with Pt. Pt was notified that I would route this note to Dr. Carlean Purl to make him aware for when he returns: Pt verbalized understanding with all questions answered.

## 2022-11-26 NOTE — Telephone Encounter (Signed)
Please schedule patient a follow up visit. Patient due May 2024. Return in about 6 months (around 03/26/2023) for Sjogren's. Thanks!

## 2022-11-26 NOTE — Telephone Encounter (Signed)
Patient states she went to the Emergency Department, she had a CT done and was told to have Dr. Carlean Purl review the scan. Patient called and wanted to have Dr. Carlean Purl review and return the call with the results, patient also wanted to know if she would need a follow up appointment. Please advise.

## 2022-11-27 ENCOUNTER — Encounter: Payer: Self-pay | Admitting: Internal Medicine

## 2022-12-02 ENCOUNTER — Encounter: Payer: Self-pay | Admitting: Physician Assistant

## 2022-12-02 ENCOUNTER — Ambulatory Visit: Payer: BC Managed Care – PPO | Admitting: Physician Assistant

## 2022-12-02 VITALS — BP 110/80 | HR 64 | Ht 65.0 in | Wt 127.0 lb

## 2022-12-02 DIAGNOSIS — R1013 Epigastric pain: Secondary | ICD-10-CM

## 2022-12-02 DIAGNOSIS — R194 Change in bowel habit: Secondary | ICD-10-CM

## 2022-12-02 DIAGNOSIS — K529 Noninfective gastroenteritis and colitis, unspecified: Secondary | ICD-10-CM

## 2022-12-02 DIAGNOSIS — K625 Hemorrhage of anus and rectum: Secondary | ICD-10-CM | POA: Diagnosis not present

## 2022-12-02 DIAGNOSIS — R109 Unspecified abdominal pain: Secondary | ICD-10-CM | POA: Diagnosis not present

## 2022-12-02 MED ORDER — NA SULFATE-K SULFATE-MG SULF 17.5-3.13-1.6 GM/177ML PO SOLN: 1.0000 | ORAL | 0 refills | Status: DC

## 2022-12-02 NOTE — Progress Notes (Signed)
Chief Complaint: Follow-up ER visit for colitis  HPI:    Dana [name] is a 67 year old female with a past medical history as listed below including osteoporosis, PVC and Sjogren's disease and stroke, not on anticoagulation, known to Dr. Leone Payor, who was referred to me by Panosh, Neta Mends, MD for follow-up after being seen in the ER for colitis.    09/28/2013 colonoscopy with mild diverticulosis in the sigmoid colon otherwise normal.  Repeat recommended 10 years.    03/11/2022 office visit with Quentin Mulling for [facility].  At that time recommended repeat ultrasound in June.    05/10/2022 right upper quadrant ultrasound with no abnormalities, specifically the gallbladder was normal.    11/25/2022 patient seen in the ED with aching generalized abdominal pain as well as nausea and vomiting.  CMP with a creatinine 1.05 and otherwise normal.  Normal CBC, lipase urine culture.  CT abdomen pelvis with contrast showed some wall thickening of the distal transverse and proximal descending colon which likely represent nonspecific colitis.  Diffuse mesenteric stranding the small bowel, nonspecific and likely reactive.  Sigmoid diverticulosis without diverticulitis, small amount of free fluid in the anatomic pelvis likely reactive and degenerative changes in the lower lumbar spine.  Gallbladder normal.  At that time patient given Augmentin and Zofran.    Today, the patient tells me she was feeling fine but on the morning that she presented to the ER she woke up with acute diarrhea and feeling unwell.  She thought she just ate something wrong.  Since then has been on Augmentin and Carafate with Omeprazole and all of her symptoms are pretty much gone other than occasional epigastric discomfort.  She did have a little bit of rectal bleeding at the time of symptoms but this is resolved.  She asked what medication she needs to stay on going forward as it is hard for her with her thyroid meds.    Denies fever, chills or weight  loss.  Past Medical History:  Diagnosis Date   Abnormal finding on MRI of brain 01/10/2015   CVA (cerebral infarction) 2010   diplopia neg acute MRi whit matter changes neg LP    Elevated LFTs 11/23/2014   High blood pressure    readings   High cholesterol    Hip pain, acute 10/13/2013   Hx of varicella    Low bone density 07/13/2018   Migraine with aura    auras   Non-cardiac chest pain    hops and evaluation 2011 dr Riley Kill   Osteoporosis 07/13/2018   Other and unspecified hyperlipidemia 04/27/2012   Has been on meds for a number of years .  Low dose crestor .  Baseline reported as 280/    PFO (patent foramen ovale)    Moderate size   Pneumonia 2007   and flu hospitalized    Primary biliary cholangitis (HCC) 04/08/2018   Sjogren's disease (HCC)    Stroke (HCC)    on hrt double vision- possible blood clot on brain; Dr. Lesia Sago at [facility]    Thyroid disease     Past Surgical History:  Procedure Laterality Date   BREAST SURGERY     implants   COLONOSCOPY     FOOT SURGERY     had bunyon removed   TONSILLECTOMY     TOOTH EXTRACTION  11/30/2018   TUBAL LIGATION      Current Outpatient Medications  Medication Sig Dispense Refill   amoxicillin-clavulanate (AUGMENTIN) 875-125 MG tablet Take 1 tablet  by mouth 2 (two) times daily for 10 days. 20 tablet 0   aspirin 81 MG tablet Take 81 mg by mouth daily.     atorvastatin (LIPITOR) 20 MG tablet Take 1 tablet (20 mg total) by mouth daily. 90 tablet 2   benazepril (LOTENSIN) 5 MG tablet TAKE 1 TABLET (5 MG TOTAL) BY MOUTH DAILY. 90 tablet 1   levothyroxine (SYNTHROID) 88 MCG tablet TAKE 1 TABLET (88 MCG TOTAL) BY MOUTH DAILY. DOSAGE CHANGE 90 tablet 1   Na Sulfate-K Sulfate-Mg Sulf (SUPREP BOWEL PREP KIT) 17.5-3.13-1.6 GM/177ML SOLN Take 1 kit by mouth as directed. For colonoscopy prep 354 mL 0   omeprazole (PRILOSEC) 40 MG capsule Take 1 capsule (40 mg total) by mouth daily. 30 capsule 0   ondansetron (ZOFRAN) 4  MG tablet Take 1 tablet (4 mg total) by mouth every 8 (eight) hours as needed for nausea or vomiting. 12 tablet 0   pilocarpine (SALAGEN) 5 MG tablet TAKE 1 TABLET BY MOUTH TWICE A DAY 180 tablet 0   sucralfate (CARAFATE) 1 g tablet Take 1 tablet (1 g total) by mouth 4 (four) times daily -  with meals and at bedtime. 120 tablet 0   ursodiol (ACTIGALL) 250 MG tablet TAKE 1 TABLET BY MOUTH TWICE A DAY 180 tablet 1   No current facility-administered medications for this visit.    Allergies as of 12/02/2022 - Review Complete 12/02/2022  Allergen Reaction Noted   Estrogens Other (See Comments) 10/31/2012   Codeine  03/21/2010   Ibuprofen  03/21/2010   Sulfa antibiotics Itching 05/03/2020    Family History  Adopted: Yes  Problem Relation Age of Onset   Alcohol abuse Mother    Pancreatic cancer Mother    Liver disease Mother    Healthy Daughter    Breast cancer Maternal Aunt    Stomach cancer Neg Hx    Esophageal cancer Neg Hx    Colon cancer Neg Hx     Social History   Socioeconomic History   Marital status: Married    Spouse name: Not on file   Number of children: 1   Years of education: Not on file   Highest education level: Not on file  Occupational History   Not on file  Tobacco Use   Smoking status: Never    Passive exposure: Never   Smokeless tobacco: Never  Vaping Use   Vaping Use: Never used  Substance and Sexual Activity   Alcohol use: No   Drug use: No   Sexual activity: Yes  Other Topics Concern   Not on file  Social History Narrative   Hole between heart and lung-untreatable    Married    orig from  [place]   hh of 2   Pet dog euthanized    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    Neg tad   Some caffiene    Seatbelts no ets fa stored    G1P1   Physically active     55 yo grandaughter    Patient is left handed.      Social Determinants of Health   Financial Resource Strain: Not on file  Food  Insecurity: Not on file  Transportation Needs: Not on file  Physical Activity: Not on file  Stress: Not on file  Social Connections: Not on file  Intimate Partner Violence: Not on file    Review of Systems:  Constitutional: No weight loss, fever or chills Cardiovascular: No chest pain Respiratory: No SOB  Gastrointestinal: See HPI and otherwise negative   Physical Exam:  Vital signs: BP 110/80   Pulse 64   Ht 5\' 5"  (1.651 m)   Wt 127 lb (57.6 kg)   LMP 04/25/2009   BMI 21.13 kg/m    Constitutional:   Pleasant Caucasian female appears to be in NAD, Well developed, Well nourished, alert and cooperative Respiratory: Respirations even and unlabored. Lungs clear to auscultation bilaterally.   No wheezes, crackles, or rhonchi.  Cardiovascular: Normal S1, S2. No MRG. Regular rate and rhythm. No peripheral edema, cyanosis or pallor.  Gastrointestinal:  Soft, nondistended, nontender. No rebound or guarding. Normal bowel sounds. No appreciable masses or hepatomegaly. Rectal:  Not performed.  Psychiatric: Oriented to person, place and time. Demonstrates good judgement and reason without abnormal affect or behaviors.  RELEVANT LABS AND IMAGING: CBC    Component Value Date/Time   WBC 9.5 11/25/2022 1131   RBC 4.05 11/25/2022 1131   HGB 12.5 11/25/2022 1131   HCT 37.4 11/25/2022 1131   PLT 233 11/25/2022 1131   MCV 92.3 11/25/2022 1131   MCH 30.9 11/25/2022 1131   MCHC 33.4 11/25/2022 1131   RDW 13.2 11/25/2022 1131   LYMPHSABS 1.0 09/04/2022 1030   MONOABS 0.6 09/04/2022 1030   EOSABS 0.2 09/04/2022 1030   BASOSABS 0.0 09/04/2022 1030    CMP     Component Value Date/Time   NA 138 11/25/2022 1131   K 3.9 11/25/2022 1131   CL 103 11/25/2022 1131   CO2 24 11/25/2022 1131   GLUCOSE 85 11/25/2022 1131   BUN 17 11/25/2022 1131   CREATININE 1.05 (H) 11/25/2022 1131   CREATININE 0.89 01/11/2021 1212   CALCIUM 9.4 11/25/2022 1131   PROT 7.3 11/25/2022 1131   ALBUMIN 4.3  11/25/2022 1131   AST 21 11/25/2022 1131   ALT 15 11/25/2022 1131   ALKPHOS 80 11/25/2022 1131   BILITOT 0.5 11/25/2022 1131   GFRNONAA 58 (L) 11/25/2022 1131   GFRNONAA 68 01/11/2021 1212   GFRAA 78 01/11/2021 1212    Assessment: 1.  Colitis: CT showing colitis, symptoms have resolved on Augmentin over the past 7 days; likely bacterial versus viral 2.  PBC: Stable 3.  Screening for colorectal cancer: Patient technically due in November of this year for repeat colonoscopy  Plan: 1.  Patient is worried regarding some bleeding that she had during episode of colitis.  She would like to go ahead and do her colonoscopy.  She would technically be due in January for screening.  Patient was scheduled for colonoscopy in the [facility] with Dr. February 4 to 6 weeks from finishing her Augmentin.  Did provide the patient a detailed list risks for the procedure and she agrees to proceed. Patient is appropriate for endoscopic procedure(s) in the ambulatory ([facility]) setting.  2.  Patient can discontinue her Carafate now as the timing of this is difficult for her to take and her symptoms are mostly gone. 3.  Continue Omeprazole for another week and then can trial discontinuing this as well. 4.  Patient to follow in clinic per recommendations from Dr. Leone Payor after time of procedure.  Leone Payor, PA-C [facility] 12/02/2022, 10:47 AM  Cc: 01/31/2023, MD

## 2022-12-02 NOTE — Patient Instructions (Signed)
Stop Carafate.   Continue Prilosec for 1 week, then you may stop.   Start Miralax - dissolve 1 capful daily in at least 8 ounces of water daily.   We have sent the following medications to your pharmacy for you to pick up at your convenience: [place] have been scheduled for a colonoscopy. Please follow written instructions given to you at your visit today.  Please pick up your prep supplies at the pharmacy within the next 1-3 days. If you use inhalers (even only as needed), please bring them with you on the day of your procedure.  Due to recent changes in healthcare laws, you may see the results of your imaging and laboratory studies on MyChart before your provider has had a chance to review them.  We understand that in some cases there may be results that are confusing or concerning to you. Not all laboratory results come back in the same time frame and the provider may be waiting for multiple results in order to interpret others.  Please give [place] 48 hours in order for your provider to thoroughly review all the results before contacting the office for clarification of your results.   Thank you for choosing me and [facility].   Ellouise Newer PA-C

## 2022-12-03 ENCOUNTER — Encounter: Payer: Self-pay | Admitting: Internal Medicine

## 2022-12-03 NOTE — Telephone Encounter (Signed)
Please send my apologies - I must not have scrolled on screen properly - I see the note from the visit.  Gallbladder was normal on June ultrasound and Ct scan at ED visit. I am not concerned that it is diseased or needs further attention.  We can discuss further at colonoscopy

## 2022-12-03 NOTE — Telephone Encounter (Signed)
I reviewed everything Looks like she did not come 1/8 for JLL appt  As long as she is getting better no need to come in and I will see her next month for planned colonoscopy

## 2022-12-03 NOTE — Telephone Encounter (Signed)
Pt was made aware of Dr. Carlean Purl recommendations: Pt stated that she did come for the Office Visit and seen Earnie Larsson PA yesterday: Pt stated that wanted to make you aware that she is still concerned about her gallbladder: Please advise

## 2022-12-04 NOTE — Telephone Encounter (Signed)
Pt made aware of Dr. Carlean Purl apologies and recommendations: Pt verbalized understanding with all questions answered.

## 2022-12-14 ENCOUNTER — Other Ambulatory Visit: Payer: Self-pay | Admitting: Internal Medicine

## 2022-12-19 ENCOUNTER — Other Ambulatory Visit: Payer: Self-pay | Admitting: Internal Medicine

## 2022-12-30 ENCOUNTER — Other Ambulatory Visit: Payer: BC Managed Care – PPO

## 2022-12-30 ENCOUNTER — Telehealth: Payer: Self-pay | Admitting: Physician Assistant

## 2022-12-30 DIAGNOSIS — R195 Other fecal abnormalities: Secondary | ICD-10-CM

## 2022-12-30 NOTE — Addendum Note (Signed)
Addended by: Yevette Edwards on: 12/30/2022 11:07 AM   Modules accepted: Orders

## 2022-12-30 NOTE — Telephone Encounter (Signed)
Returned call to patient. She states that she had diarrhea yesterday and today when she went to have a bowel movement she "is 100% sure that she passed a dead tapeworm". I asked patient if it could have been something that she had eaten, like a vegetable. Pt stated "No", she states that it was having from her rectum and it touched the water. Pt states that is was not moving. Pt states that when she wiped most of the item fell into the water and the remainder on the toilet paper looked like a medium sized worm. Pt is very concerned that "she may have worms in her stomach". She states that she is very embarrassed and concerned. Would like something done ASAP, she has a colonoscopy on 01/07/23 at 10:30 am. I told pt that you may order an O/P stool test to make sure, but I will check with you. Please advise, thanks.

## 2022-12-30 NOTE — Telephone Encounter (Signed)
Called and spoke with patient to relay recommendations. Pt will try to stop by this afternoon to pick up stool kit and collection instructions. Pt verbalized understanding and had no concerns at the end of the call.  O/P order in epic.

## 2022-12-30 NOTE — Telephone Encounter (Signed)
Inbound call from patient, states she is having fecal inconsistencies. Did not want to disclose further, is requesting to speak to a nurse. Please advise.

## 2022-12-31 ENCOUNTER — Other Ambulatory Visit: Payer: BC Managed Care – PPO

## 2022-12-31 ENCOUNTER — Encounter: Payer: Self-pay | Admitting: Internal Medicine

## 2022-12-31 DIAGNOSIS — R195 Other fecal abnormalities: Secondary | ICD-10-CM

## 2023-01-01 LAB — OVA AND PARASITE EXAMINATION
CONCENTRATE RESULT:: NONE SEEN
MICRO NUMBER:: 14525499
SPECIMEN QUALITY:: ADEQUATE
TRICHROME RESULT:: NONE SEEN

## 2023-01-02 ENCOUNTER — Telehealth: Payer: Self-pay | Admitting: Physician Assistant

## 2023-01-02 NOTE — Telephone Encounter (Signed)
Inbound call from patient, would like a call in regards to lab results. Please advise.

## 2023-01-02 NOTE — Telephone Encounter (Signed)
Returned call to patient. She states that she received her results and was concerned that the "worm" that she passed could have laid eggs inside of her and they would grow. I reassured patient that the O/P stool test checks for intestinal parasites and their eggs. I assured pt that her test was negative and it was unlikely anything concerning. I informed patient again that it could have been something that she had eating that just did not digest well, but she should not be concerned at this time. I informed patient that she can further discuss with Dr. Carlean Purl at the time of her colonoscopy next week. Pt was thankful for the reassurance. Pt verbalized understanding of all information and had no concerns at the end of the call.

## 2023-01-07 ENCOUNTER — Ambulatory Visit (AMBULATORY_SURGERY_CENTER): Payer: BC Managed Care – PPO | Admitting: Internal Medicine

## 2023-01-07 ENCOUNTER — Encounter: Payer: Self-pay | Admitting: Internal Medicine

## 2023-01-07 VITALS — BP 122/78 | HR 78 | Temp 98.6°F | Resp 15 | Ht 65.0 in | Wt 127.0 lb

## 2023-01-07 DIAGNOSIS — Z1211 Encounter for screening for malignant neoplasm of colon: Secondary | ICD-10-CM | POA: Diagnosis present

## 2023-01-07 DIAGNOSIS — K529 Noninfective gastroenteritis and colitis, unspecified: Secondary | ICD-10-CM

## 2023-01-07 MED ORDER — SODIUM CHLORIDE 0.9 % IV SOLN: 500.0000 mL | Freq: Once | INTRAVENOUS | Status: DC

## 2023-01-07 NOTE — Progress Notes (Signed)
Sedate, gd SR, tolerated procedure well, VSS, report to RN 

## 2023-01-07 NOTE — Op Note (Signed)
[place] Patient Name: Dana [name] Procedure Date: 01/07/2023 10:59 AM MRN: [id number] Endoscopist: Gatha Mayer , MD, [id number] Age: 67 Referring MD:  Date of Birth: 01/20/1956 Gender: Female Account #: [id number] Procedure:                Colonoscopy Indications:              Screening for colorectal malignant neoplasm, Last                            colonoscopy: November 2014 Medicines:                Monitored Anesthesia Care Procedure:                Pre-Anesthesia Assessment:                           - Prior to the procedure, a History and Physical                            was performed, and patient medications and                            allergies were reviewed. The patient's tolerance of                            previous anesthesia was also reviewed. The risks                            and benefits of the procedure and the sedation                            options and risks were discussed with the patient.                            All questions were answered, and informed consent                            was obtained. Prior Anticoagulants: The patient has                            taken no anticoagulant or antiplatelet agents. ASA                            Grade Assessment: II - A patient with mild systemic                            disease. After reviewing the risks and benefits,                            the patient was deemed in satisfactory condition to                            undergo the procedure.  After obtaining informed consent, the colonoscope                            was passed under direct vision. Throughout the                            procedure, the patient's blood pressure, pulse, and                            oxygen saturations were monitored continuously. The                            [id number]-[id number] Colonoscope [id number] was introduced                            through the anus and advanced to the  the cecum,                            identified by appendiceal orifice and ileocecal                            valve. The colonoscopy was somewhat difficult due                            to significant looping. Successful completion of                            the procedure was aided by straightening and                            shortening the scope to obtain bowel loop                            reduction. The patient tolerated the procedure                            well. The quality of the bowel preparation was                            excellent. The ileocecal valve, appendiceal                            orifice, and rectum were photographed. The bowel                            preparation used was SUPREP via extended prep with                            split dose instruction. The bowel preparation used                            was Miralax via split dose instruction. Scope In: [id number] AM Scope Out: 11:19:01 AM Scope Withdrawal Time: 0 hours 7 minutes 57 seconds  Total Procedure  Duration: 0 hours 14 minutes 43 seconds  Findings:                 The perianal and digital rectal examinations were                            normal.                           Multiple diverticula were found in the sigmoid                            colon.                           The exam was otherwise without abnormality on                            direct and retroflexion views. Complications:            No immediate complications. Estimated Blood Loss:     Estimated blood loss: none. Impression:               - Diverticulosis in the sigmoid colon.                           - The examination was otherwise normal on direct                            and retroflexion views.                           - No specimens collected. Recommendation:           - Patient has a contact number available for                            emergencies. The signs and symptoms of potential                             delayed complications were discussed with the                            patient. Return to normal activities tomorrow.                            Written discharge instructions were provided to the                            patient.                           - Resume previous diet.                           - Continue present medications.                           - No repeat colonoscopy due to  current age (51                            years or older) and the absence of colonic polyps. Gatha Mayer, MD 01/07/2023 11:25:57 AM This report has been signed electronically.

## 2023-01-07 NOTE — Progress Notes (Signed)
[facility] History and Physical   Primary Care Physician:  Burnis Medin, MD   Reason for Procedure:   CRCA screening  Plan:    colonoscopy     HPI: Dana [name] is a 67 y.o. female for screening exam - negative exam 2014   Past Medical History:  Diagnosis Date   Abnormal finding on MRI of brain 01/10/2015   CVA (cerebral infarction) 2010   diplopia neg acute MRi whit matter changes neg LP    Elevated LFTs 11/23/2014   High blood pressure    readings   High cholesterol    Hip pain, acute 10/13/2013   Hx of varicella    Low bone density 07/13/2018   Migraine with aura    auras   Non-cardiac chest pain    hops and evaluation 2011 dr Lia Foyer   Osteoporosis 07/13/2018   Other and unspecified hyperlipidemia 04/27/2012   Has been on meds for a number of years .  Low dose crestor .  Baseline reported as 280/    PFO (patent foramen ovale)    Moderate size   Pneumonia 2007   and flu hospitalized    Primary biliary cholangitis ([place]) 04/08/2018   Sjogren's disease ([place])    Stroke ([place])    on hrt double vision- possible blood clot on brain; Dr. Floyde Parkins at [facility]    Thyroid disease     Past Surgical History:  Procedure Laterality Date   BREAST SURGERY     implants   COLONOSCOPY     FOOT SURGERY     had bunyon removed   TONSILLECTOMY     TOOTH EXTRACTION  11/30/2018   TUBAL LIGATION      Prior to Admission medications   Medication Sig Start Date End Date Taking? Authorizing Provider  aspirin 81 MG tablet Take 81 mg by mouth daily.   Yes [provider]  atorvastatin (LIPITOR) 20 MG tablet Take 1 tablet (20 mg total) by mouth daily. 11/07/22  Yes Panosh, Standley Brooking, MD  benazepril (LOTENSIN) 5 MG tablet TAKE 1 TABLET (5 MG TOTAL) BY MOUTH DAILY. 08/29/22  Yes Billie Ruddy, MD  levothyroxine (SYNTHROID) 88 MCG tablet TAKE 1 TABLET (88 MCG TOTAL) BY MOUTH DAILY. DOSAGE CHANGE 10/10/22  Yes Panosh, Standley Brooking, MD  pilocarpine (SALAGEN)  5 MG tablet TAKE 1 TABLET BY MOUTH TWICE A DAY 11/26/22  Yes Ofilia Neas, PA-C  ursodiol (ACTIGALL) 250 MG tablet TAKE 1 TABLET BY MOUTH TWICE A DAY 10/10/22  Yes Gatha Mayer, MD  omeprazole (PRILOSEC) 40 MG capsule TAKE 1 CAPSULE (40 MG TOTAL) BY MOUTH DAILY. 12/16/22   Gatha Mayer, MD  ondansetron (ZOFRAN) 4 MG tablet Take 1 tablet (4 mg total) by mouth every 8 (eight) hours as needed for nausea or vomiting. 11/25/22   Tegeler, Gwenyth Allegra, MD  sucralfate (CARAFATE) 1 g tablet TAKE 1 TABLET (1 G TOTAL) BY MOUTH 4 TIMES A DAY WITH MEALS AND AT BEDTIME 12/19/22   Gatha Mayer, MD    Current Outpatient Medications  Medication Sig Dispense Refill   aspirin 81 MG tablet Take 81 mg by mouth daily.     atorvastatin (LIPITOR) 20 MG tablet Take 1 tablet (20 mg total) by mouth daily. 90 tablet 2   benazepril (LOTENSIN) 5 MG tablet TAKE 1 TABLET (5 MG TOTAL) BY MOUTH DAILY. 90 tablet 1   levothyroxine (SYNTHROID) 88 MCG tablet TAKE 1 TABLET (88 MCG TOTAL) BY MOUTH  DAILY. DOSAGE CHANGE 90 tablet 1   pilocarpine (SALAGEN) 5 MG tablet TAKE 1 TABLET BY MOUTH TWICE A DAY 180 tablet 0   ursodiol (ACTIGALL) 250 MG tablet TAKE 1 TABLET BY MOUTH TWICE A DAY 180 tablet 1   omeprazole (PRILOSEC) 40 MG capsule TAKE 1 CAPSULE (40 MG TOTAL) BY MOUTH DAILY. 90 capsule 1   ondansetron (ZOFRAN) 4 MG tablet Take 1 tablet (4 mg total) by mouth every 8 (eight) hours as needed for nausea or vomiting. 12 tablet 0   sucralfate (CARAFATE) 1 g tablet TAKE 1 TABLET (1 G TOTAL) BY MOUTH 4 TIMES A DAY WITH MEALS AND AT BEDTIME 120 tablet 0   Current Facility-Administered Medications  Medication Dose Route Frequency Provider Last Rate Last Admin   0.9 %  sodium chloride infusion  500 mL Intravenous Once Gatha Mayer, MD        Allergies as of 01/07/2023 - Review Complete 01/07/2023  Allergen Reaction Noted   Estrogens Other (See Comments) 10/31/2012   Codeine  03/21/2010   Ibuprofen  03/21/2010   Sulfa  antibiotics Itching 05/03/2020    Family History  Adopted: Yes  Problem Relation Age of Onset   Alcohol abuse Mother    Pancreatic cancer Mother    Liver disease Mother    Healthy Daughter    Breast cancer Maternal Aunt    Stomach cancer Neg Hx    Esophageal cancer Neg Hx    Colon cancer Neg Hx     Social History   Socioeconomic History   Marital status: Married    Spouse name: Not on file   Number of children: 1   Years of education: Not on file   Highest education level: Not on file  Occupational History   Not on file  Tobacco Use   Smoking status: Never    Passive exposure: Never   Smokeless tobacco: Never  Vaping Use   Vaping Use: Never used  Substance and Sexual Activity   Alcohol use: No   Drug use: No   Sexual activity: Yes  Other Topics Concern   Not on file  Social History Narrative   Hole between heart and lung-untreatable    Married    orig from  [place]   hh of 2   Pet dog euthanized    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    Neg tad   Some caffiene    Seatbelts no ets fa stored    G1P1   Physically active     69 yo grandaughter    Patient is left handed.      Social Determinants of Health   Financial Resource Strain: Not on file  Food Insecurity: Not on file  Transportation Needs: Not on file  Physical Activity: Not on file  Stress: Not on file  Social Connections: Not on file  Intimate Partner Violence: Not on file    Review of Systems:  All other review of systems negative except as mentioned in the HPI.  Physical Exam: Vital signs BP 132/71   Pulse 80   Temp 98.6 F (37 C)   Ht 5' 5"$  (1.651 m)   Wt 127 lb (57.6 kg)   LMP 04/25/2009   SpO2 98%   BMI 21.13 kg/m   General:   Alert,  Well-developed, well-nourished, pleasant and cooperative in NAD Lungs:  Clear throughout to auscultation.   Heart:  Regular rate and rhythm; no murmurs, clicks, rubs,  or gallops. Abdomen:  Soft,  nontender and nondistended. Normal bowel sounds.   Neuro/Psych:  Alert and cooperative. Normal mood and affect. A and O x 3   @Shya Kovatch$  Dana Maffucci, MD, [facility] [phone] (pager) 01/07/2023 10:56 AM@

## 2023-01-07 NOTE — Patient Instructions (Addendum)
No polyps or cancer seen.  You do have diverticulosis - thickened muscle rings and pouches in the colon wall. Please read the handout about this condition.  You may consider repeating a routine colonoscopy at 73. I do not typically recommend that in someone without polyps like you (stop preventive colonoscopy after 75). If you were to have gastrointestinal problems that indicate a need for colonoscopy then age is not the decicing factor.  I appreciate the opportunity to care for you. Gatha Mayer, MD, [facility]    Handout on diverticulosis provided.  See MD's procedure report for recommendations.  YOU HAD AN ENDOSCOPIC PROCEDURE TODAY AT [facility]:   Refer to the procedure report that was given to you for any specific questions about what was found during the examination.  If the procedure report does not answer your questions, please call your gastroenterologist to clarify.  If you requested that your care partner not be given the details of your procedure findings, then the procedure report has been included in a sealed envelope for you to review at your convenience later.  YOU SHOULD EXPECT: Some feelings of bloating in the abdomen. Passage of more gas than usual.  Walking can help get rid of the air that was put into your GI tract during the procedure and reduce the bloating. If you had a lower endoscopy (such as a colonoscopy or flexible sigmoidoscopy) you may notice spotting of blood in your stool or on the toilet paper. If you underwent a bowel prep for your procedure, you may not have a normal bowel movement for a few days.  Please Note:  You might notice some irritation and congestion in your nose or some drainage.  This is from the oxygen used during your procedure.  There is no need for concern and it should clear up in a day or so.  SYMPTOMS TO REPORT IMMEDIATELY:  Following lower endoscopy (colonoscopy or flexible sigmoidoscopy):  Excessive amounts of blood in  the stool  Significant tenderness or worsening of abdominal pains  Swelling of the abdomen that is new, acute  Fever of 100F or higher   For urgent or emergent issues, a gastroenterologist can be reached at any hour by calling [phone]. Do not use MyChart messaging for urgent concerns.    DIET:  We do recommend a small meal at first, but then you may proceed to your regular diet.  Drink plenty of fluids but you should avoid alcoholic beverages for 24 hours.  ACTIVITY:  You should plan to take it easy for the rest of today and you should NOT DRIVE or use heavy machinery until tomorrow (because of the sedation medicines used during the test).    FOLLOW UP: Our staff will call the number listed on your records the next business day following your procedure.  We will call around 7:15- 8:00 am to check on you and address any questions or concerns that you may have regarding the information given to you following your procedure. If we do not reach you, we will leave a message.     If any biopsies were taken you will be contacted by phone or by letter within the next 1-3 weeks.  Please call us at [phone] if you have not heard about the biopsies in 3 weeks.    SIGNATURES/CONFIDENTIALITY: You and/or your care partner have signed paperwork which will be entered into your electronic medical record.  These signatures attest to the fact that that the  information above on your After Visit Summary has been reviewed and is understood.  Full responsibility of the confidentiality of this discharge information lies with you and/or your care-partner.

## 2023-01-08 ENCOUNTER — Telehealth: Payer: Self-pay | Admitting: *Deleted

## 2023-01-08 NOTE — Telephone Encounter (Signed)
  Follow up Call-     01/07/2023   10:05 AM  Call back number  Post procedure Call Back phone  # [phone]  Permission to leave phone message Yes     Patient questions:  Do you have a fever, pain , or abdominal swelling? No. Pain Score  0 *  Have you tolerated food without any problems? Yes.    Have you been able to return to your normal activities? Yes.    Do you have any questions about your discharge instructions: Diet   No. Medications  No. Follow up visit  No.  Do you have questions or concerns about your Care? No.  Actions: * If pain score is 4 or above: No action needed, pain <4.

## 2023-01-09 ENCOUNTER — Encounter: Payer: Self-pay | Admitting: Internal Medicine

## 2023-01-09 ENCOUNTER — Ambulatory Visit: Payer: BC Managed Care – PPO | Admitting: Internal Medicine

## 2023-01-09 VITALS — BP 124/84 | HR 94 | Temp 98.2°F | Wt 124.0 lb

## 2023-01-09 DIAGNOSIS — R052 Subacute cough: Secondary | ICD-10-CM

## 2023-01-09 DIAGNOSIS — I10 Essential (primary) hypertension: Secondary | ICD-10-CM

## 2023-01-09 MED ORDER — LOSARTAN POTASSIUM 50 MG PO TABS: 50.0000 mg | ORAL_TABLET | Freq: Every day | ORAL | 1 refills | Status: DC

## 2023-01-09 NOTE — Progress Notes (Signed)
Established Patient Office Visit     CC/Reason for Visit: Cough  HPI: Dana [name] is a 67 y.o. female who is coming in today for the above mentioned reasons.  She has been experiencing a cough for the last 3 to 4 weeks.  Cough is productive of scant amount of clear sputum.  She has also had rhinorrhea, headache, sneezing, postnasal drip.  No recent travel, no sick contacts.  She has had pneumonia in the past so she is concerned about this.  She has not recently been treated for a URI.  She was started on benazepril in November for treatment of hypertension.   Past Medical/Surgical History: Past Medical History:  Diagnosis Date   Abnormal finding on MRI of brain 01/10/2015   CVA (cerebral infarction) 2010   diplopia neg acute MRi whit matter changes neg LP    Elevated LFTs 11/23/2014   High blood pressure    readings   High cholesterol    Hip pain, acute 10/13/2013   Hx of varicella    Low bone density 07/13/2018   Migraine with aura    auras   Non-cardiac chest pain    hops and evaluation 2011 dr Lia Foyer   Osteoporosis 07/13/2018   Other and unspecified hyperlipidemia 04/27/2012   Has been on meds for a number of years .  Low dose crestor .  Baseline reported as 280/    PFO (patent foramen ovale)    Moderate size   Pneumonia 2007   and flu hospitalized    Primary biliary cholangitis ([place]) 04/08/2018   Sjogren's disease ([place])    Stroke ([place])    on hrt double vision- possible blood clot on brain; Dr. Floyde Parkins at [facility]    Thyroid disease     Past Surgical History:  Procedure Laterality Date   BREAST SURGERY     implants   COLONOSCOPY     FOOT SURGERY     had bunyon removed   TONSILLECTOMY     TOOTH EXTRACTION  11/30/2018   TUBAL LIGATION      Social History:  reports that she has never smoked. She has never been exposed to tobacco smoke. She has never used smokeless tobacco. She reports that she does not drink alcohol and does not use  drugs.  Allergies: Allergies  Allergen Reactions   Estrogens Other (See Comments)    Had stroke on HRT/OCP   Codeine     REACTION: nausea   Ibuprofen     REACTION: nause   Sulfa Antibiotics Itching    Pt reported on 05/03/2020    Family History:  Family History  Adopted: Yes  Problem Relation Age of Onset   Alcohol abuse Mother    Pancreatic cancer Mother    Liver disease Mother    Healthy Daughter    Breast cancer Maternal Aunt    Stomach cancer Neg Hx    Esophageal cancer Neg Hx    Colon cancer Neg Hx      Current Outpatient Medications:    aspirin 81 MG tablet, Take 81 mg by mouth daily., Disp: , Rfl:    atorvastatin (LIPITOR) 20 MG tablet, Take 1 tablet (20 mg total) by mouth daily., Disp: 90 tablet, Rfl: 2   levothyroxine (SYNTHROID) 88 MCG tablet, TAKE 1 TABLET (88 MCG TOTAL) BY MOUTH DAILY. DOSAGE CHANGE, Disp: 90 tablet, Rfl: 1   losartan (COZAAR) 50 MG tablet, Take 1 tablet (50 mg total) by mouth daily., Disp:  90 tablet, Rfl: 1   pilocarpine (SALAGEN) 5 MG tablet, TAKE 1 TABLET BY MOUTH TWICE A DAY, Disp: 180 tablet, Rfl: 0   ursodiol (ACTIGALL) 250 MG tablet, TAKE 1 TABLET BY MOUTH TWICE A DAY, Disp: 180 tablet, Rfl: 1   ondansetron (ZOFRAN) 4 MG tablet, Take 1 tablet (4 mg total) by mouth every 8 (eight) hours as needed for nausea or vomiting. (Patient not taking: Reported on 01/09/2023), Disp: 12 tablet, Rfl: 0  Review of Systems:  Negative unless indicated in HPI.   Physical Exam: Vitals:   01/09/23 1519  BP: 124/84  Pulse: 94  Temp: 98.2 F (36.8 C)  TempSrc: Oral  SpO2: 97%  Weight: 124 lb (56.2 kg)    Body mass index is 20.63 kg/m.   Physical Exam Vitals reviewed.  Constitutional:      Appearance: Normal appearance.  HENT:     Right Ear: Tympanic membrane, ear canal and external ear normal.     Left Ear: Tympanic membrane, ear canal and external ear normal.     Mouth/Throat:     Mouth: Mucous membranes are moist.     Pharynx: Oropharynx  is clear.  Eyes:     Conjunctiva/sclera: Conjunctivae normal.     Pupils: Pupils are equal, round, and reactive to light.  Cardiovascular:     Rate and Rhythm: Normal rate and regular rhythm.  Pulmonary:     Effort: Pulmonary effort is normal.     Breath sounds: Normal breath sounds.  Neurological:     Mental Status: She is alert.      Impression and Plan:  Subacute cough  Essential hypertension - Plan: losartan (COZAAR) 50 MG tablet  -I believe the cough is likely related to allergies/postnasal drip.  Have advised use of daily antihistamine and twice daily Mucinex over the next 2 to 3 weeks. -However because she started taking benazepril last November, ACE inhibitor induced cough is also a possibility.  I will have her switch out benazepril for losartan.  She will monitor ambulatory blood pressure measurements and follow-up with her primary care doctor in 6 to 8 weeks.  Time spent:30 minutes reviewing chart, interviewing and examining patient and formulating plan of care.     Lelon Frohlich, MD [facility]

## 2023-02-04 ENCOUNTER — Encounter: Payer: Self-pay | Admitting: Internal Medicine

## 2023-03-31 ENCOUNTER — Telehealth: Payer: Self-pay | Admitting: Internal Medicine

## 2023-03-31 DIAGNOSIS — R944 Abnormal results of kidney function studies: Secondary | ICD-10-CM

## 2023-03-31 DIAGNOSIS — I10 Essential (primary) hypertension: Secondary | ICD-10-CM

## 2023-03-31 NOTE — Telephone Encounter (Signed)
Patient states she was advised to have kidney function checked in may. Pls advise

## 2023-04-01 NOTE — Addendum Note (Signed)
Addended byVickii Chafe on: 04/01/2023 09:59 AM   Modules accepted: Orders

## 2023-04-01 NOTE — Telephone Encounter (Signed)
Order placed and lab appt scheduled.

## 2023-04-03 ENCOUNTER — Other Ambulatory Visit (INDEPENDENT_AMBULATORY_CARE_PROVIDER_SITE_OTHER): Payer: BC Managed Care – PPO

## 2023-04-03 ENCOUNTER — Encounter: Payer: Self-pay | Admitting: Internal Medicine

## 2023-04-03 ENCOUNTER — Ambulatory Visit: Payer: BC Managed Care – PPO | Admitting: Internal Medicine

## 2023-04-03 VITALS — BP 118/62 | HR 80 | Ht 65.0 in | Wt 128.0 lb

## 2023-04-03 DIAGNOSIS — K5909 Other constipation: Secondary | ICD-10-CM

## 2023-04-03 DIAGNOSIS — R944 Abnormal results of kidney function studies: Secondary | ICD-10-CM | POA: Diagnosis not present

## 2023-04-03 DIAGNOSIS — R10811 Right upper quadrant abdominal tenderness: Secondary | ICD-10-CM | POA: Diagnosis not present

## 2023-04-03 DIAGNOSIS — K743 Primary biliary cirrhosis: Secondary | ICD-10-CM | POA: Diagnosis not present

## 2023-04-03 DIAGNOSIS — I10 Essential (primary) hypertension: Secondary | ICD-10-CM | POA: Diagnosis not present

## 2023-04-03 LAB — BASIC METABOLIC PANEL
BUN: 15 mg/dL (ref 6–23)
CO2: 26 mEq/L (ref 19–32)
Calcium: 10.1 mg/dL (ref 8.4–10.5)
Chloride: 104 mEq/L (ref 96–112)
Creatinine, Ser: 1.03 mg/dL (ref 0.40–1.20)
GFR: 55.84 mL/min — ABNORMAL LOW (ref >60.00)
Glucose, Bld: 54 mg/dL — ABNORMAL LOW (ref 70–99)
Potassium: 4 mEq/L (ref 3.5–5.1)
Sodium: 140 mEq/L (ref 135–145)

## 2023-04-03 LAB — COMPREHENSIVE METABOLIC PANEL
ALT: 18 U/L (ref 0–35)
AST: 22 U/L (ref 0–37)
Albumin: 4.2 g/dL (ref 3.5–5.2)
Alkaline Phosphatase: 116 U/L (ref 39–117)
BUN: 15 mg/dL (ref 6–23)
CO2: 30 mEq/L (ref 19–32)
Calcium: 10.2 mg/dL (ref 8.4–10.5)
Chloride: 102 mEq/L (ref 96–112)
Creatinine, Ser: 1.07 mg/dL (ref 0.40–1.20)
GFR: 53.34 mL/min — ABNORMAL LOW (ref >60.00)
Glucose, Bld: 91 mg/dL (ref 70–99)
Potassium: 4.2 mEq/L (ref 3.5–5.1)
Sodium: 139 mEq/L (ref 135–145)
Total Bilirubin: 0.5 mg/dL (ref 0.2–1.2)
Total Protein: 7.7 g/dL (ref 6.0–8.3)

## 2023-04-03 NOTE — Patient Instructions (Signed)
Your provider has requested that you go to the basement level for lab work before leaving today. Press "B" on the elevator. The lab is located at the first door on the left as you exit the elevator.  Due to recent changes in healthcare laws, you may see the results of your imaging and laboratory studies on MyChart before your provider has had a chance to review them.  We understand that in some cases there may be results that are confusing or concerning to you. Not all laboratory results come back in the same time frame and the provider may be waiting for multiple results in order to interpret others.  Please give [place] 48 hours in order for your provider to thoroughly review all the results before contacting the office for clarification of your results.   Don't touch your sore area on your abdomin.  I appreciate the opportunity to care for you. Stan Head, MD, [facility]

## 2023-04-03 NOTE — Progress Notes (Signed)
Dana [name] 67 y.o. 01/15/1956 [id number]  Assessment & Plan:   Encounter Diagnoses  Name Primary?   Primary biliary cholangitis (HCC) Yes   Chronic constipation    Right upper quadrant abdominal tenderness without rebound tenderness     She has done well with her PBC will continue ursodiol.  Continue MiraLAX for constipation.  CMET today to follow-up PBC.  Reassurance regarding tenderness.  Advised to avoid palpating his that to get aggravating it.  CT scanning in January of this year did not reveal any abnormality there.  Reassured again.  Once again I reassured her that there has been no good evidence for parasites in her body.  Should she have a recurrence I have asked her to at least take a photo.    Chemistry      Component Value Date/Time   NA 139 04/03/2023 1604   K 4.2 04/03/2023 1604   CL 102 04/03/2023 1604   CO2 30 04/03/2023 1604   BUN 15 04/03/2023 1604   CREATININE 1.07 04/03/2023 1604   CREATININE 0.89 01/11/2021 1212      Component Value Date/Time   CALCIUM 10.2 04/03/2023 1604   ALKPHOS 116 04/03/2023 1604   AST 22 04/03/2023 1604   ALT 18 04/03/2023 1604   BILITOT 0.5 04/03/2023 1604       Subjective:   Chief Complaint: Abdominal pain PBC  HPI Dana [name] is here with her daughter, to follow-up on primary biliary cholangitis.  She tells me about how she had an 80 inch long brown worm in her bowel movement at 1 point back in February, she actually had told Dana Meeker, PA-C about this previously had an ova and parasite test was negative.  Subsequent to that because of a suggestion of colitis on the CT scan I performed a colonoscopy in February on the 13th and it was normal except for diverticulosis as below.  She has not had any problems since bowel movements are regular though she is using some MiraLAX to treat her constipation.  She has a tender spot in the right upper quadrant that she is a little concerned about.  It does not hurt unless she  is palpating the area.  Occasional heartburn.  Otherwise she is doing well. Colonoscopy 01/07/2023 - Diverticulosis in the sigmoid colon.                           - The examination was otherwise normal on direct                            and retroflexion views.                           - No specimens collected.   Allergies  Allergen Reactions   Estrogens Other (See Comments)    Had stroke on HRT/OCP   Codeine     REACTION: nausea   Ibuprofen     REACTION: nause   Sulfa Antibiotics Itching    Pt reported on 05/03/2020   Current Meds  Medication Sig   aspirin 81 MG tablet Take 81 mg by mouth daily.   atorvastatin (LIPITOR) 20 MG tablet Take 1 tablet (20 mg total) by mouth daily.   levothyroxine (SYNTHROID) 88 MCG tablet TAKE 1 TABLET (88 MCG TOTAL) BY MOUTH DAILY. DOSAGE CHANGE   losartan (COZAAR) 50 MG tablet Take 1  tablet (50 mg total) by mouth daily.   pilocarpine (SALAGEN) 5 MG tablet TAKE 1 TABLET BY MOUTH TWICE A DAY   ursodiol (ACTIGALL) 250 MG tablet TAKE 1 TABLET BY MOUTH TWICE A DAY   Past Medical History:  Diagnosis Date   Abnormal finding on MRI of brain 01/10/2015   CVA (cerebral infarction) 2010   diplopia neg acute MRi whit matter changes neg LP    Elevated LFTs 11/23/2014   High blood pressure    readings   High cholesterol    Hip pain, acute 10/13/2013   Hx of varicella    Low bone density 07/13/2018   Migraine with aura    auras   Non-cardiac chest pain    hops and evaluation 2011 dr Riley Kill   Osteoporosis 07/13/2018   Other and unspecified hyperlipidemia 04/27/2012   Has been on meds for a number of years .  Low dose crestor .  Baseline reported as 280/    PFO (patent foramen ovale)    Moderate size   Pneumonia 2007   and flu hospitalized    Primary biliary cholangitis (HCC) 04/08/2018   Sjogren's disease (HCC)    Stroke (HCC)    on hrt double vision- possible blood clot on brain; Dr. 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    Neg tad   Some caffiene    Seatbelts no ets fa stored    G1P1   Physically active     5 yo grandaughter    Patient is left handed.      family history includes Alcohol abuse in her mother; Breast cancer in her maternal aunt; Healthy in her daughter; Liver disease in her mother; Pancreatic cancer in her mother. She was adopted.   Review of Systems As per HPI  Objective:   Physical Exam BP 118/62   Pulse 80   Ht 5\' 5"  (1.651 m)   Wt 128 lb (58.1 kg)   LMP 04/25/2009   BMI 21.30 kg/m  Well-developed well-nourished white woman in no acute distress Abdomen is soft, there is a mildly tender spot in the medial aspect of the right upper quadrant.  Not the ribs not the xiphoid.  Pain is similar with straight leg raise.  No organomegaly or mass no hernia.

## 2023-04-04 ENCOUNTER — Encounter: Payer: Self-pay | Admitting: Internal Medicine

## 2023-04-04 ENCOUNTER — Other Ambulatory Visit: Payer: Self-pay | Admitting: Internal Medicine

## 2023-04-04 DIAGNOSIS — K743 Primary biliary cirrhosis: Secondary | ICD-10-CM

## 2023-04-04 LAB — MICROALBUMIN / CREATININE URINE RATIO
Creatinine,U: 43.5 mg/dL
Microalb Creat Ratio: 1.6 mg/g (ref 0.0–30.0)
Microalb, Ur: <0.7 mg/dL (ref 0.0–1.9)

## 2023-04-08 NOTE — Progress Notes (Signed)
See message  documentation from my char trequest

## 2023-04-08 NOTE — Telephone Encounter (Signed)
So the low glucose could be a lba effect if the blood was sitting  before spun down( cells in tube eat up the glucose in the specimen) unless you were hhaving low blood sugar  sx at the time of the blood draw not too concerned)   The gfr has been somewhat decreased  for a while but holding steady ( there is no protein in urine and do not suspect active kidney disease)  however will review record  for any other clues to a need for  intervention)  we advise avoid  all antiinflammatories ( like advil alevel  that can effect kidney function and  maintain good bp  to make sure not negatively  effecting the kidneys .    BP Readings from Last 3 Encounters:  04/03/23 118/62  01/09/23 124/84  01/07/23 122/78  BP readings in system are very good and at goal  Also  imaging of  your kedneys form scan in jan shows normal sized kidneys   reassuring I advise  keeping a check on this lab as is being done   Dr Leone Payor has ordered a repeat che[facility] panel in about 6 mos wich should include GFR   Hope this helps .

## 2023-05-16 ENCOUNTER — Other Ambulatory Visit: Payer: Self-pay | Admitting: Physician Assistant

## 2023-05-16 NOTE — Telephone Encounter (Signed)
Please schedule patient a follow up visit. Patient due May 2024. Thanks!  

## 2023-05-16 NOTE — Telephone Encounter (Signed)
Last Fill: 11/26/2022  Next Visit: Due May 2024. Message sent to the front to schedule.   Last Visit: 09/25/2022   Dx: Sjogren's syndrome with keratoconjunctivitis sicca   Current Dose per office note on 09/25/2022: continues to take pilocarpine for dry mouth and dry eye symptoms   Okay to refill Pilocarpine?

## 2023-05-20 NOTE — Telephone Encounter (Signed)
Patient called stating she will call back in the fall to schedule a follow-up appointment.  Patient states she had labwork at her appointment with Dr. Leone Payor last month.

## 2023-05-20 NOTE — Telephone Encounter (Signed)
Attempted to contact patient to schedule follow-up appointment / voicemail full / unable to leave a message

## 2023-06-02 ENCOUNTER — Encounter: Payer: Self-pay | Admitting: Gastroenterology

## 2023-06-02 ENCOUNTER — Encounter: Payer: Self-pay | Admitting: Internal Medicine

## 2023-06-02 ENCOUNTER — Ambulatory Visit: Payer: BC Managed Care – PPO | Admitting: Gastroenterology

## 2023-06-02 VITALS — BP 118/68 | HR 80 | Ht 64.5 in | Wt 129.0 lb

## 2023-06-02 DIAGNOSIS — K644 Residual hemorrhoidal skin tags: Secondary | ICD-10-CM

## 2023-06-02 MED ORDER — HYDROCORTISONE (PERIANAL) 2.5 % EX CREA: 1.0000 | TOPICAL_CREAM | Freq: Two times a day (BID) | CUTANEOUS | 0 refills | Status: AC

## 2023-06-02 NOTE — Patient Instructions (Addendum)
_______________________________________________________  If your blood pressure at your visit was 140/90 or greater, please contact your primary care physician to follow up on this.  _______________________________________________________  If you are age 69 or older, your body mass index should be between 23-30. Your Body mass index is 21.8 kg/m. If this is out of the aforementioned range listed, please consider follow up with your Primary Care Provider. ________________________________________________________  The [facility] providers would like to encourage you to use [facility] to communicate with providers for non-urgent requests or questions.  Due to long hold times on the telephone, sending your provider a message by [facility] may be a faster and more efficient way to get a response.  Please allow 48 business hours for a response.  Please remember that this is for non-urgent requests.  _______________________________________________________  We have sent the following medications to your pharmacy for you to pick up at your convenience:  Hydrocortisone perianal cream 2.5% twice daily for 14 days   You can follow up in our office on an as needed basis.  Thank you for entrusting me with your care and choosing [facility].  [place], PA-C

## 2023-06-02 NOTE — Progress Notes (Signed)
Chief Complaint: Hemorrhoids Primary GI MD: Dr. Leone Payor  HPI:  67 year old female with history of PBC presents for evaluation of hemorrhoids  Recently seen 03/2023 by Dr. Leone Payor. Reports 8inch long brown worm in her stool back in February. Negative ova and parasite. Ct ab/pelvis in January showed nonspecific colitis, however, colonoscopy performed in February was unrevealing. At last OV she reported some RUQ pain upon palpation. Advised to avoid palpation.  Patient is on ursodiol for PBC. Last CMP 04/03/2023 was unrevealing.  Patient reports history of constipation to which she takes miralax daily for. Her bowel movements were regular and constipation was well controlled. She didn't take the miralax for a few days and subsequently became constipated. During this time she had multiple bowel movements where she had to strain. She then developed severe rectal pain. Got OTC hemorrhoid wipes which provided relief. She no longer has pain but her daughter and husband wanted her to come get checked out. She is doing well today.  She does report occasional GERD, rare, and intermittent.   PREVIOUS GI WORKUP   Colonoscopy 01/07/2023 - Diverticulosis in the sigmoid colon.                           - The examination was otherwise normal on direct                            and retroflexion views.                           - No specimens collected.   Past Medical History:  Diagnosis Date   Abnormal finding on MRI of brain 01/10/2015   CVA (cerebral infarction) 2010   diplopia neg acute MRi whit matter changes neg LP    Elevated LFTs 11/23/2014   High blood pressure    readings   High cholesterol    Hip pain, acute 10/13/2013   Hx of varicella    Low bone density 07/13/2018   Migraine with aura    auras   Non-cardiac chest pain    hops and evaluation 2011 dr Riley Kill   Osteoporosis 07/13/2018   Other and unspecified hyperlipidemia 04/27/2012   Has been on meds for a number of years .  Low dose  crestor .  Baseline reported as 280/    PFO (patent foramen ovale)    Moderate size   Pneumonia 2007   and flu hospitalized    Primary biliary cholangitis (HCC) 04/08/2018   Sjogren's disease (HCC)    Stroke (HCC)    on hrt double vision- possible blood clot on brain; Dr. Lesia Sago at [facility]    Thyroid disease     Past Surgical History:  Procedure Laterality Date   BREAST SURGERY     implants   COLONOSCOPY     FOOT SURGERY     had bunyon removed   TONSILLECTOMY     TOOTH EXTRACTION  11/30/2018   TUBAL LIGATION      Current Outpatient Medications  Medication Sig Dispense Refill   aspirin 81 MG tablet Take 81 mg by mouth daily.     atorvastatin (LIPITOR) 20 MG tablet Take 1 tablet (20 mg total) by mouth daily. 90 tablet 2   levothyroxine (SYNTHROID) 88 MCG tablet TAKE 1 TABLET (88 MCG TOTAL) BY MOUTH DAILY. DOSAGE CHANGE 90 tablet 1  losartan (COZAAR) 50 MG tablet Take 1 tablet (50 mg total) by mouth daily. 90 tablet 1   ondansetron (ZOFRAN) 4 MG tablet Take 1 tablet (4 mg total) by mouth every 8 (eight) hours as needed for nausea or vomiting. (Patient not taking: Reported on 04/03/2023) 12 tablet 0   pilocarpine (SALAGEN) 5 MG tablet TAKE 1 TABLET TWICE A DAY 180 tablet 0   ursodiol (ACTIGALL) 250 MG tablet TAKE 1 TABLET BY MOUTH TWICE A DAY 180 tablet 1   No current facility-administered medications for this visit.    Allergies as of 06/02/2023 - Review Complete 04/03/2023  Allergen Reaction Noted   Estrogens Other (See Comments) 10/31/2012   Codeine  03/21/2010   Ibuprofen  03/21/2010   Sulfa antibiotics Itching 05/03/2020    Family History  Adopted: Yes  Problem Relation Age of Onset   Alcohol abuse Mother    Pancreatic cancer Mother    Liver disease Mother    Healthy Daughter    Breast cancer Maternal Aunt    Stomach cancer Neg Hx    Esophageal cancer Neg Hx    Colon cancer Neg Hx     Social History   Socioeconomic History   Marital  status: Married    Spouse name: Not on file   Number of children: 1   Years of education: Not on file   Highest education level: Not on file  Occupational History   Not on file  Tobacco Use   Smoking status: Never    Passive exposure: Never   Smokeless tobacco: Never  Vaping Use   Vaping Use: Never used  Substance and Sexual Activity   Alcohol use: No   Drug use: No   Sexual activity: Yes  Other Topics Concern   Not on file  Social History Narrative   Hole between heart and lung-untreatable    Married    orig from  [place]   hh of 2   Pet dog euthanized    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    Neg tad   Some caffiene    Seatbelts no ets fa stored    G1P1   Physically active     5 yo grandaughter    Patient is left handed.      Social Determinants of Health   Financial Resource Strain: Not on file  Food Insecurity: Not on file  Transportation Needs: Not on file  Physical Activity: Not on file  Stress: Not on file  Social Connections: Not on file  Intimate Partner Violence: Not on file    Review of Systems:    Constitutional: No weight loss, fever, chills, weakness or fatigue HEENT: Eyes: No change in vision               Ears, Nose, Throat:  No change in hearing or congestion Skin: No rash or itching Cardiovascular: No chest pain, chest pressure or palpitations   Respiratory: No SOB or cough Gastrointestinal: See HPI and otherwise negative Genitourinary: No dysuria or change in urinary frequency Neurological: No headache, dizziness or syncope Musculoskeletal: No new muscle or joint pain Hematologic: No bleeding or bruising Psychiatric: No history of depression or anxiety    Physical Exam:  Vital signs: LMP 04/25/2009   Constitutional: NAD, Well developed, Well nourished, alert and cooperative Head:  Normocephalic and atraumatic. Eyes:   PEERL, EOMI. No icterus. Conjunctiva pink. Respiratory:  Respirations even  and unlabored. Lungs clear to auscultation bilaterally.   No wheezes, crackles, or rhonchi.  Cardiovascular:  Regular rate and rhythm. No peripheral edema, cyanosis or pallor.  Gastrointestinal:  Soft, nondistended, nontender. No rebound or guarding. Normal bowel sounds. No appreciable masses or hepatomegaly. Rectal:  two moderate sized, nonthrombosed external hemorrhoids. No obvious fissures. Patient declined DRE. Msk:  Symmetrical without gross deformities. Without edema, no deformity or joint abnormality.  Neurologic:  Alert and  oriented x4;  grossly normal neurologically.  Skin:   Dry and intact without significant lesions or rashes. Psychiatric: Oriented to person, place and time. Demonstrates good judgement and reason without abnormal affect or behaviors.   RELEVANT LABS AND IMAGING: CBC    Component Value Date/Time   WBC 9.5 11/25/2022 1131   RBC 4.05 11/25/2022 1131   HGB 12.5 11/25/2022 1131   HCT 37.4 11/25/2022 1131   PLT 233 11/25/2022 1131   MCV 92.3 11/25/2022 1131   MCH 30.9 11/25/2022 1131   MCHC 33.4 11/25/2022 1131   RDW 13.2 11/25/2022 1131   LYMPHSABS 1.0 09/04/2022 1030   MONOABS 0.6 09/04/2022 1030   EOSABS 0.2 09/04/2022 1030   BASOSABS 0.0 09/04/2022 1030    CMP     Component Value Date/Time   NA 139 04/03/2023 1604   K 4.2 04/03/2023 1604   CL 102 04/03/2023 1604   CO2 30 04/03/2023 1604   GLUCOSE 91 04/03/2023 1604   BUN 15 04/03/2023 1604   CREATININE 1.07 04/03/2023 1604   CREATININE 0.89 01/11/2021 1212   CALCIUM 10.2 04/03/2023 1604   PROT 7.7 04/03/2023 1604   ALBUMIN 4.2 04/03/2023 1604   AST 22 04/03/2023 1604   ALT 18 04/03/2023 1604   ALKPHOS 116 04/03/2023 1604   BILITOT 0.5 04/03/2023 1604   GFRNONAA 58 (L) 11/25/2022 1131   GFRNONAA 68 01/11/2021 1212   GFRAA 78 01/11/2021 1212    Assessment: 1. External hemorrhoids 2. GERD  Plan: 1. Discussed importance of continued bowel regimen and miralax regularly.  Can also use squatty potty 2. Can do hydrocortisone cream twice daily for 14 days. 3. Educated patient on GERD lifestyle modifications including avoiding trigger foods, not lying down soon after eating. Can do 30 day trial of OTC omeprazole. 4. Follow up prn.  Lara Mulch [facility] 06/02/2023, 8:49 AM  Cc: Madelin Headings, MD

## 2023-06-13 ENCOUNTER — Other Ambulatory Visit: Payer: Self-pay | Admitting: Internal Medicine

## 2023-06-16 ENCOUNTER — Ambulatory Visit: Payer: BC Managed Care – PPO | Admitting: Podiatry

## 2023-06-16 ENCOUNTER — Encounter: Payer: Self-pay | Admitting: Podiatry

## 2023-06-16 DIAGNOSIS — Q828 Other specified congenital malformations of skin: Secondary | ICD-10-CM | POA: Diagnosis not present

## 2023-06-16 DIAGNOSIS — M779 Enthesopathy, unspecified: Secondary | ICD-10-CM

## 2023-06-16 NOTE — Progress Notes (Signed)
This patient presents to the office with painful skin lesion left forefoot.  She says she often self treats the skin lesion at home.  She presents to the office to learn the reason for her skin lesion.  Patient has history of bunion correction big toe left foot.  She says this pain appears deep to the callus.  She presents for evaluation and treatment.    Vascular  Dorsalis pedis and posterior tibial pulses are palpable  B/L.  Capillary return  WNL.  Temperature gradient is  WNL.  Skin turgor  WNL  Sensorium  Senn Weinstein monofilament wire  WNL. Normal tactile sensation.  Nail Exam  Patient has normal nails with no evidence of bacterial or fungal infection.  Orthopedic  Exam  Muscle tone and muscle strength  WNL.  No limitations of motion feet  B/L.  No crepitus or joint effusion noted.  Foot type is unremarkable and digits show no abnormalities.  Mild HAV 1st MPJ left foot.  Painful upon palpation 2nd MPJ left foot.  Skin  No open lesions.  Normal skin texture and turgor. Porokeratosis noted sub 2 left foot..    Porokeratosis sub 2 left foot.    Debride porokeratosis sub 2 left foot with # 15 blade.  Gave her a sheet to order gel dispersion pad to wear left forefoot.  RTC prn  Helane Gunther DPM

## 2023-06-17 ENCOUNTER — Other Ambulatory Visit: Payer: Self-pay | Admitting: Internal Medicine

## 2023-07-06 ENCOUNTER — Other Ambulatory Visit: Payer: Self-pay | Admitting: Internal Medicine

## 2023-07-06 DIAGNOSIS — I10 Essential (primary) hypertension: Secondary | ICD-10-CM

## 2023-08-08 ENCOUNTER — Other Ambulatory Visit: Payer: Self-pay | Admitting: Internal Medicine

## 2023-08-15 ENCOUNTER — Other Ambulatory Visit: Payer: Self-pay | Admitting: Internal Medicine

## 2023-08-23 ENCOUNTER — Other Ambulatory Visit: Payer: Self-pay | Admitting: Internal Medicine

## 2023-08-25 NOTE — Progress Notes (Unsigned)
Office Visit Note  Patient: Dana [name]             Date of Birth: 05/05/1956           MRN: [id number]             PCP: Madelin Headings, MD Referring: Madelin Headings, MD Visit Date: 08/26/2023 Occupation: @GUAROCC @  Subjective:  Sicca symptoms   History of Present Illness: Dana [name] is a 67 y.o. female with history of sjogren's syndrome and osteopenia.  Patient was initiated on xiidra eyedrops twice daily by her ophthalmologist about 2 months ago.  She has noticed an improvement in eye dryness since initiating xiidra.  She is scheduled for upcoming left eye cataract surgery on 09/25/23.  Patient remains on pilocarpine 5 mg 1 tablet twice daily for symptomatic relief.  She requested a refill of pilocarpine to be sent to the pharmacy today.  She denies any swollen lymph nodes or parotid swelling.  She continues to see the dentist every 6 months.  She has occasional discomfort in her wrist joints and knee joints but denies any joint swelling.  Activities of Daily Living:  Patient reports morning stiffness for 0 minutes  Patient Reports nocturnal pain.  Difficulty dressing/grooming: Denies Difficulty climbing stairs: Denies Difficulty getting out of chair: Denies Difficulty using hands for taps, buttons, cutlery, and/or writing: Denies  Review of Systems  Constitutional:  Negative for fatigue.  HENT:  Positive for mouth dryness. Negative for mouth sores and nose dryness.   Eyes:  Positive for dryness. Negative for pain and visual disturbance.  Respiratory:  Negative for cough, hemoptysis, shortness of breath and difficulty breathing.   Cardiovascular:  Positive for palpitations. Negative for chest pain, hypertension and swelling in legs/feet.  Gastrointestinal:  Positive for constipation. Negative for blood in stool and diarrhea.  Endocrine: Negative for increased urination.  Genitourinary:  Negative for painful urination.  Musculoskeletal:  Negative for joint pain, joint pain,  joint swelling, myalgias, muscle weakness, morning stiffness, muscle tenderness and myalgias.  Skin:  Negative for color change, pallor, rash, hair loss, nodules/bumps, skin tightness, ulcers and sensitivity to sunlight.  Neurological:  Negative for dizziness, numbness, headaches and weakness.  Hematological:  Negative for swollen glands.  Psychiatric/Behavioral:  Negative for depressed mood and sleep disturbance. The patient is not nervous/anxious.     PMFS History:  Patient Active Problem List   Diagnosis Date Noted   Porokeratosis 06/16/2023   Capsulitis 06/16/2023   Abnormal gallbladder ultrasound - slightly thick wall 01/25/2022   Elevated blood pressure reading without diagnosis of hypertension 02/16/2020   Onychomycosis 08/05/2018   Sjogren's disease (HCC) 07/13/2018   Osteoporosis 07/13/2018   Vitamin D deficiency 07/06/2018   Primary biliary cholangitis (HCC) 04/08/2018   Tetanus, diphtheria, and acellular pertussis (Tdap) vaccination declined 04/08/2018   Trigger point of right shoulder region 03/24/2018   Slipped rib syndrome 03/19/2018   Nonallopathic lesion of rib cage 03/19/2018   Nonallopathic lesion of thoracic region 03/19/2018   Nonallopathic lesion of cervical region 03/19/2018   Low serum vitamin D 04/09/2016   PFO (patent foramen ovale)    Abnormal finding on MRI of brain 01/10/2015   Visit for preventive health examination 11/24/2014   Bilateral arm weakness  hx of  11/23/2014   Essential hypertension 11/23/2014   Left paraspinal back pain 10/13/2013   Chronic constipation 09/16/2013   ESR raised 08/23/2013   Menopausal hot flushes 04/27/2012   Other and unspecified hyperlipidemia 04/27/2012  Migraine with aura    Hyperlipidemia 01/03/2012   Hypothyroidism 03/21/2010   HYPERTENSION, UNSPECIFIED 03/21/2010   CEREBROVASCULAR ACCIDENT, HX OF 03/21/2010    Past Medical History:  Diagnosis Date   Abnormal finding on MRI of brain 01/10/2015   CVA (cerebral  infarction) 2010   diplopia neg acute MRi whit matter changes neg LP    Elevated LFTs 11/23/2014   High blood pressure    readings   High cholesterol    Hip pain, acute 10/13/2013   Hx of varicella    Low bone density 07/13/2018   Migraine with aura    auras   Non-cardiac chest pain    hops and evaluation 2011 dr Riley Kill   Osteoporosis 07/13/2018   Other and unspecified hyperlipidemia 04/27/2012   Has been on meds for a number of years .  Low dose crestor .  Baseline reported as 280/    PFO (patent foramen ovale)    Moderate size   Pneumonia 2007   and flu hospitalized    Primary biliary cholangitis (HCC) 04/08/2018   Sjogren's disease (HCC)    Stroke (HCC)    on hrt double vision- possible blood clot on brain; Dr. Lesia Sago at [facility]    Thyroid disease     Family History  Adopted: Yes  Problem Relation Age of Onset   Alcohol abuse Mother    Pancreatic cancer Mother    Liver disease Mother    Healthy Daughter    Breast cancer Maternal Aunt    Stomach cancer Neg Hx    Esophageal cancer Neg Hx    Colon cancer Neg Hx    Past Surgical History:  Procedure Laterality Date   BREAST SURGERY     implants   COLONOSCOPY     FOOT SURGERY     had bunyon removed   TONSILLECTOMY     TOOTH EXTRACTION  11/30/2018   TUBAL LIGATION     Social History   Social History Narrative   Hole between heart and lung-untreatable    Married    orig from  [place]   hh of 2   Pet dog euthanized    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    Neg tad   Some caffiene    Seatbelts no ets fa stored    G1P1   Physically active     5 yo grandaughter    Patient is left handed.      Immunization History  Administered Date(s) Administered   Fluad Quad(high Dose 65+) 09/10/2019, 11/14/2020   Influenza Split 09/08/2012   Influenza,inj,Quad PF,6+ Mos 08/23/2013, 11/16/2014, 09/07/2015, 08/23/2016, 09/02/2017, 09/08/2018   Moderna  Sars-Covid-2 Vaccination 07/17/2020, 08/14/2020   Pneumococcal Conjugate-13 04/26/2011, 09/10/2019   Pneumococcal Polysaccharide-23 06/12/2021   Zoster Recombinant(Shingrix) 06/12/2021, 09/12/2021     Objective: Vital Signs: BP 114/69 (BP Location: Left Arm, Patient Position: Sitting, Cuff Size: Normal)   Pulse 63   Resp 14   Ht 5\' 6"  (1.676 m)   Wt 129 lb (58.5 kg)   LMP 04/25/2009   BMI 20.82 kg/m    Physical Exam Vitals and nursing note reviewed.  Constitutional:      Appearance: She is well-developed.  HENT:     Head: Normocephalic and atraumatic.  Eyes:     Conjunctiva/sclera: Conjunctivae normal.  Cardiovascular:     Rate and Rhythm: Normal rate and regular rhythm.  Heart sounds: Normal heart sounds.  Pulmonary:     Effort: Pulmonary effort is normal.     Breath sounds: Normal breath sounds.  Abdominal:     General: Bowel sounds are normal.     Palpations: Abdomen is soft.  Musculoskeletal:     Cervical back: Normal range of motion.  Lymphadenopathy:     Cervical: No cervical adenopathy.  Skin:    General: Skin is warm and dry.     Capillary Refill: Capillary refill takes less than 2 seconds.  Neurological:     Mental Status: She is alert and oriented to person, place, and time.  Psychiatric:        Behavior: Behavior normal.      Musculoskeletal Exam: C-spine has good range of motion.  Shoulder joints, elbow joints, wrist joints, MCPs, PIPs, DIPs have good range of motion with no synovitis.  Complete fist formation bilaterally.  Hip joints have good range of motion with no groin pain.  Knee joints have good range of motion no warmth or effusion.  Ankle joints have good range of motion with no tenderness or joint swelling.  CDAI Exam: CDAI Score: -- Patient Global: --; Provider Global: -- Swollen: --; Tender: -- Joint Exam 08/26/2023   No joint exam has been documented for this visit   There is currently no information documented on the homunculus. Go  to the Rheumatology activity and complete the homunculus joint exam.  Investigation: No additional findings.  Imaging: No results found.  Recent Labs: Lab Results  Component Value Date   WBC 9.5 11/25/2022   HGB 12.5 11/25/2022   PLT 233 11/25/2022   NA 139 04/03/2023   K 4.2 04/03/2023   CL 102 04/03/2023   CO2 30 04/03/2023   GLUCOSE 91 04/03/2023   BUN 15 04/03/2023   CREATININE 1.07 04/03/2023   BILITOT 0.5 04/03/2023   ALKPHOS 116 04/03/2023   AST 22 04/03/2023   ALT 18 04/03/2023   PROT 7.7 04/03/2023   ALBUMIN 4.2 04/03/2023   CALCIUM 10.2 04/03/2023   GFRAA 78 01/11/2021   QFTBGOLDPLUS NEGATIVE 03/06/2022    Speciality Comments: PLQ Eye Exam: 09/25/18 WNL @ [facility] follow up 1 year  Procedures:  No procedures performed Allergies: Estrogens, Codeine, Ibuprofen, and Sulfa antibiotics   Assessment / Plan:     Visit Diagnoses: Sjogren's syndrome with keratoconjunctivitis sicca (HCC) - ANA+, Ro+: Patient continues to have chronic sicca symptoms which have been manageable.  She was started on Xiidra eyedrops twice daily by her ophthalmologist about 2 months ago which have improved her eye dryness.  She has been taking pilocarpine 5 mg 1 tablet twice daily-a refill was sent to the pharmacy yesterday.  She continues to see the dentist every 6 months as recommended.  No parotid swelling or tenderness noted on examination today.  No cervical lymphadenopathy noted.  Discussed the 4-14 fold increased risk for developing lymphoma in patients with Sjogren's syndrome.  Plan to check SPEP with upcoming lab work along with the following autoimmune panel.  Future orders for the following lab work were placed today. No synovitis noted on examination.  She does not require immunosuppressive therapy at this time.  She was advised to notify us if she develops any new or worsening symptoms.  She will follow-up in the office in 5 to 6 months or sooner if needed.  - Plan: CBC  with Differential/Platelet, Urinalysis, Routine w reflex microscopic, COMPLETE METABOLIC PANEL WITH GFR, ANA, Sedimentation rate, C3 and  C4, Rheumatoid factor, Serum protein electrophoresis with reflex, Sjogrens syndrome-A extractable nuclear antibody, Sjogrens syndrome-B extractable nuclear antibody  High risk medication use - She was started on PLQ in October 2019 but discontinued after 2 weeks due to developing tinnitus and constipation. - Plan: CBC with Differential/Platelet, COMPLETE METABOLIC PANEL WITH GFR  Positive ANA (antinuclear antibody) - August 11, 2018 AVISE index 0.2, ANA 1: 5120 homogeneous, SSA positive, dsDNA negative, Smith negative, RNP negative, SSB Negative, SCL 70 is negative: Plan to obtain the following lab work today for further evaluation.- Plan: CBC with Differential/Platelet, Urinalysis, Routine w reflex microscopic, COMPLETE METABOLIC PANEL WITH GFR, ANA, Sedimentation rate, C3 and C4, Rheumatoid factor, Serum protein electrophoresis with reflex, Sjogrens syndrome-A extractable nuclear antibody, Sjogrens syndrome-B extractable nuclear antibody  Primary biliary cholangitis (HCC):  Under care of Dr. Narda Bonds visit scheduled.  Osteopenia of both hips - DEXA 07/06/2018 RFN T score of -2.4 and LFN -2.0. Previously prescribed fosamax.  Followed by PCP.  Vitamin D deficiency  Other medical conditions are listed as follows:  Patent foramen ovale  History of hyperlipidemia  History of stroke  History of hypothyroidism  History of recent fall  Orders: Orders Placed This Encounter  Procedures   CBC with Differential/Platelet   Urinalysis, Routine w reflex microscopic   COMPLETE METABOLIC PANEL WITH GFR   ANA   Sedimentation rate   C3 and C4   Rheumatoid factor   Serum protein electrophoresis with reflex   Sjogrens syndrome-A extractable nuclear antibody   Sjogrens syndrome-B extractable nuclear antibody   No orders of the defined types were placed  in this encounter.    Follow-Up Instructions: Return in 5 months (on 01/24/2024) for Sjogren's syndrome.   Gearldine Bienenstock, PA-C  Note - This record has been created using Dragon software.  Chart creation errors have been sought, but may not always  have been located. Such creation errors do not reflect on  the standard of medical care.

## 2023-08-25 NOTE — Telephone Encounter (Signed)
Last Fill: 05/07/2023   Next Visit: 08/26/2023   Last Visit: 09/25/2022    Dx: Sjogren's syndrome with keratoconjunctivitis sicca    Current Dose per office note on 09/25/2022: continues to take pilocarpine for dry mouth and dry eye symptoms    Okay to refill Pilocarpine?

## 2023-08-26 ENCOUNTER — Encounter: Payer: Self-pay | Admitting: Physician Assistant

## 2023-08-26 ENCOUNTER — Ambulatory Visit: Payer: BC Managed Care – PPO | Attending: Physician Assistant | Admitting: Physician Assistant

## 2023-08-26 VITALS — BP 114/69 | HR 63 | Resp 14 | Ht 66.0 in | Wt 129.0 lb

## 2023-08-26 DIAGNOSIS — R7689 Other specified abnormal immunological findings in serum: Secondary | ICD-10-CM

## 2023-08-26 DIAGNOSIS — Z79899 Other long term (current) drug therapy: Secondary | ICD-10-CM

## 2023-08-26 DIAGNOSIS — M3501 Sicca syndrome with keratoconjunctivitis: Secondary | ICD-10-CM

## 2023-08-26 DIAGNOSIS — K743 Primary biliary cirrhosis: Secondary | ICD-10-CM | POA: Diagnosis not present

## 2023-08-26 DIAGNOSIS — R768 Other specified abnormal immunological findings in serum: Secondary | ICD-10-CM

## 2023-08-26 DIAGNOSIS — Z8639 Personal history of other endocrine, nutritional and metabolic disease: Secondary | ICD-10-CM

## 2023-08-26 DIAGNOSIS — M85852 Other specified disorders of bone density and structure, left thigh: Secondary | ICD-10-CM

## 2023-08-26 DIAGNOSIS — Q2112 Patent foramen ovale: Secondary | ICD-10-CM

## 2023-08-26 DIAGNOSIS — Z8673 Personal history of transient ischemic attack (TIA), and cerebral infarction without residual deficits: Secondary | ICD-10-CM

## 2023-08-26 DIAGNOSIS — E559 Vitamin D deficiency, unspecified: Secondary | ICD-10-CM

## 2023-08-26 DIAGNOSIS — M85851 Other specified disorders of bone density and structure, right thigh: Secondary | ICD-10-CM

## 2023-08-26 DIAGNOSIS — Z9181 History of falling: Secondary | ICD-10-CM

## 2023-09-03 LAB — HM MAMMOGRAPHY

## 2023-09-05 ENCOUNTER — Encounter: Payer: Self-pay | Admitting: Internal Medicine

## 2023-09-22 NOTE — Progress Notes (Unsigned)
No chief complaint on file.   HPI: Patient  Dana [name]  67 y.o. comes in today for Preventive Health Care visit  And Chronic disease management  Sjogrens   Rheum with eye involvement  PBC  [facility] Maintenance  Topic Date Due   DTaP/Tdap/Td (1 - Tdap) Never done   COVID-19 Vaccine (3 - Moderna risk series) 09/11/2020   INFLUENZA VACCINE  06/26/2023   MAMMOGRAM  09/02/2025   Colonoscopy  01/07/2033   Pneumonia Vaccine 26+ Years old  Completed   DEXA SCAN  Completed   Hepatitis C Screening  Completed   Zoster Vaccines- Shingrix  Completed   HPV VACCINES  Aged Out   Health Maintenance Review LIFESTYLE:  Exercise:   Tobacco/ETS: Alcohol:  Sugar beverages: Sleep: Drug use: no HH of  Work:    ROS:  GEN/ HEENT: No fever, significant weight changes sweats headaches vision problems hearing changes, CV/ PULM; No chest pain shortness of breath cough, syncope,edema  change in exercise tolerance. GI /GU: No adominal pain, vomiting, change in bowel habits. No blood in the stool. No significant GU symptoms. SKIN/HEME: ,no acute skin rashes suspicious lesions or bleeding. No lymphadenopathy, nodules, masses.  NEURO/ PSYCH:  No neurologic signs such as weakness numbness. No depression anxiety. IMM/ Allergy: No unusual infections.  Allergy .   REST of 12 system review negative except as per HPI   Past Medical History:  Diagnosis Date   Abnormal finding on MRI of brain 01/10/2015   CVA (cerebral infarction) 2010   diplopia neg acute MRi whit matter changes neg LP    Elevated LFTs 11/23/2014   High blood pressure    readings   High cholesterol    Hip pain, acute 10/13/2013   Hx of varicella    Low bone density 07/13/2018   Migraine with aura    auras   Non-cardiac chest pain    hops and evaluation 2011 dr Riley Kill   Osteoporosis 07/13/2018   Other and unspecified hyperlipidemia 04/27/2012   Has been on meds for a number of years .  Low dose crestor .  Baseline  reported as 280/    PFO (patent foramen ovale)    Moderate size   Pneumonia 2007   and flu hospitalized    Primary biliary cholangitis (HCC) 04/08/2018   Sjogren's disease (HCC)    Stroke (HCC)    on hrt double vision- possible blood clot on brain; Dr. Lesia Sago at [facility]    Thyroid disease     Past Surgical History:  Procedure Laterality Date   BREAST SURGERY     implants   COLONOSCOPY     FOOT SURGERY     had bunyon removed   TONSILLECTOMY     TOOTH EXTRACTION  11/30/2018   TUBAL LIGATION      Family History  Adopted: Yes  Problem Relation Age of Onset   Alcohol abuse Mother    Pancreatic cancer Mother    Liver disease Mother    Healthy Daughter    Breast cancer Maternal Aunt    Stomach cancer Neg Hx    Esophageal cancer Neg Hx    Colon cancer Neg Hx     Social History   Socioeconomic History   Marital status: Married    Spouse name: Not on file   Number of children: 1   Years of education: Not on file   Highest education level: Not on file  Occupational History  Not on file  Tobacco Use   Smoking status: Never    Passive exposure: Never   Smokeless tobacco: Never  Vaping Use   Vaping status: Never Used  Substance and Sexual Activity   Alcohol use: No   Drug use: No   Sexual activity: Yes  Other Topics Concern   Not on file  Social History Narrative   Hole between heart and lung-untreatable    Married    orig from  [place]   hh of 2   Pet dog euthanized    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    Neg tad   Some caffiene    Seatbelts no ets fa stored    G1P1   Physically active     5 yo grandaughter    Patient is left handed.      Social Determinants of Health   Financial Resource Strain: Not on file  Food Insecurity: Not on file  Transportation Needs: Not on file  Physical Activity: Not on file  Stress: Not on file  Social Connections: Not on file    Outpatient  Medications Prior to Visit  Medication Sig Dispense Refill   aspirin 81 MG tablet Take 81 mg by mouth daily.     atorvastatin (LIPITOR) 20 MG tablet TAKE 1 TABLET BY MOUTH EVERY DAY 90 tablet 2   Azelaic Acid 15 % gel Apply topically. (Patient not taking: Reported on 08/26/2023)     levothyroxine (SYNTHROID) 88 MCG tablet TAKE 1 TABLET (88 MCG TOTAL) BY MOUTH DAILY. DOSAGE CHANGE 90 tablet 1   losartan (COZAAR) 50 MG tablet TAKE 1 TABLET BY MOUTH EVERY DAY 90 tablet 1   pilocarpine (SALAGEN) 5 MG tablet TAKE 1 TABLET BY MOUTH TWICE A DAY 180 tablet 0   ursodiol (ACTIGALL) 250 MG tablet TAKE 1 TABLET BY MOUTH TWICE A DAY 180 tablet 3   XIIDRA 5 % SOLN Apply 1 drop to eye 2 (two) times daily.     No facility-administered medications prior to visit.     EXAM:  LMP 04/25/2009   There is no height or weight on file to calculate BMI. Wt Readings from Last 3 Encounters:  08/26/23 129 lb (58.5 kg)  06/02/23 129 lb (58.5 kg)  04/03/23 128 lb (58.1 kg)    Physical Exam: Vital signs reviewed [id number]:ZDGU is a well-developed well-nourished alert cooperative    who appearsr stated age in no acute distress.  HEENT: normocephalic atraumatic , Eyes: PERRL EOM's full, conjunctiva clear, Nares: paten,t no deformity discharge or tenderness., Ears: no deformity EAC's clear TMs with normal landmarks. Mouth: clear OP, no lesions, edema.  Moist mucous membranes. Dentition in adequate repair. NECK: supple without masses, thyromegaly or bruits. CHEST/PULM:  Clear to auscultation and percussion breath sounds equal no wheeze , rales or rhonchi. No chest wall deformities or tenderness. Breast: normal by inspection . No dimpling, discharge, masses, tenderness or discharge . CV: PMI is nondisplaced, S1 S2 no gallops, murmurs, rubs. Peripheral pulses are full without delay.No JVD .  ABDOMEN: Bowel sounds normal nontender  No guard or rebound, no hepato splenomegal no CVA tenderness.  No hernia. Extremtities:  No  clubbing cyanosis or edema, no acute joint swelling or redness no focal atrophy NEURO:  Oriented x3, cranial nerves 3-12 appear to be intact, no obvious focal weakness,gait within normal limits no abnormal reflexes or asymmetrical SKIN: No acute rashes normal turgor, color,  no bruising or petechiae. PSYCH: Oriented, good eye contact, no obvious depression anxiety, cognition and judgment appear normal. LN: no cervical axillary inguinal adenopathy  Lab Results  Component Value Date   WBC 9.5 11/25/2022   HGB 12.5 11/25/2022   HCT 37.4 11/25/2022   PLT 233 11/25/2022   GLUCOSE 91 04/03/2023   CHOL 154 09/04/2022   TRIG 61.0 09/04/2022   HDL 76.20 09/04/2022   LDLCALC 65 09/04/2022   ALT 18 04/03/2023   AST 22 04/03/2023   NA 139 04/03/2023   K 4.2 04/03/2023   CL 102 04/03/2023   CREATININE 1.07 04/03/2023   BUN 15 04/03/2023   CO2 30 04/03/2023   TSH 3.19 09/04/2022   INR 1.0 02/16/2020   HGBA1C 5.6 06/12/2021   MICROALBUR <0.7 04/03/2023    BP Readings from Last 3 Encounters:  08/26/23 114/69  06/02/23 118/68  04/03/23 118/62    Lab results reviewed with patient   ASSESSMENT AND PLAN:  Discussed the following assessment and plan:    ICD-10-CM   1. Visit for preventive health examination  Z00.00     2. Sjogren's syndrome, with unspecified organ involvement (HCC)  M35.00     3. Primary biliary cholangitis (HCC)  K74.3     4. Osteoporosis without current pathological fracture, unspecified osteoporosis type  M81.0     5. Essential hypertension  I10      No follow-ups on file.  Patient Care Team: Jaine Estabrooks, Neta Mends, MD as PCP - General (Internal Medicine) Olivia Mackie, MD as Attending Physician (Obstetrics and Gynecology) York Spaniel, MD (Inactive) as Consulting Physician (Neurology) Pollyann Savoy, MD as Consulting Physician (Rheumatology) Iva Boop, MD as Consulting Physician (Gastroenterology) There are no Patient Instructions on file for  this visit.  Neta Mends. Lamin Chandley M.D.

## 2023-09-23 ENCOUNTER — Encounter: Payer: Self-pay | Admitting: Internal Medicine

## 2023-09-23 ENCOUNTER — Ambulatory Visit (INDEPENDENT_AMBULATORY_CARE_PROVIDER_SITE_OTHER): Payer: BC Managed Care – PPO | Admitting: Internal Medicine

## 2023-09-23 VITALS — BP 126/78 | HR 67 | Temp 98.0°F | Ht 63.5 in | Wt 127.4 lb

## 2023-09-23 DIAGNOSIS — M79602 Pain in left arm: Secondary | ICD-10-CM | POA: Diagnosis not present

## 2023-09-23 DIAGNOSIS — Z0000 Encounter for general adult medical examination without abnormal findings: Secondary | ICD-10-CM

## 2023-09-23 DIAGNOSIS — I10 Essential (primary) hypertension: Secondary | ICD-10-CM

## 2023-09-23 DIAGNOSIS — E785 Hyperlipidemia, unspecified: Secondary | ICD-10-CM

## 2023-09-23 DIAGNOSIS — M810 Age-related osteoporosis without current pathological fracture: Secondary | ICD-10-CM

## 2023-09-23 DIAGNOSIS — R944 Abnormal results of kidney function studies: Secondary | ICD-10-CM

## 2023-09-23 DIAGNOSIS — Z79899 Other long term (current) drug therapy: Secondary | ICD-10-CM

## 2023-09-23 DIAGNOSIS — M3500 Sicca syndrome, unspecified: Secondary | ICD-10-CM

## 2023-09-23 DIAGNOSIS — K743 Primary biliary cirrhosis: Secondary | ICD-10-CM | POA: Diagnosis not present

## 2023-09-23 DIAGNOSIS — E039 Hypothyroidism, unspecified: Secondary | ICD-10-CM

## 2023-09-23 DIAGNOSIS — E559 Vitamin D deficiency, unspecified: Secondary | ICD-10-CM

## 2023-09-23 LAB — CBC WITH DIFFERENTIAL/PLATELET
Basophils Absolute: 0.1 10*3/uL (ref 0.0–0.1)
Basophils Relative: 0.9 % (ref 0.0–3.0)
Eosinophils Absolute: 0.1 10*3/uL (ref 0.0–0.7)
Eosinophils Relative: 2.5 % (ref 0.0–5.0)
HCT: 37 % (ref 36.0–46.0)
Hemoglobin: 11.8 g/dL — ABNORMAL LOW (ref 12.0–15.0)
Lymphocytes Relative: 22.4 % (ref 12.0–46.0)
Lymphs Abs: 1.3 10*3/uL (ref 0.7–4.0)
MCHC: 31.9 g/dL (ref 30.0–36.0)
MCV: 93.2 fL (ref 78.0–100.0)
Monocytes Absolute: 0.5 10*3/uL (ref 0.1–1.0)
Monocytes Relative: 9.3 % (ref 3.0–12.0)
Neutro Abs: 3.7 10*3/uL (ref 1.4–7.7)
Neutrophils Relative %: 64.9 % (ref 43.0–77.0)
Platelets: 226 10*3/uL (ref 150.0–400.0)
RBC: 3.97 Mil/uL (ref 3.87–5.11)
RDW: 13.8 % (ref 11.5–15.5)
WBC: 5.8 10*3/uL (ref 4.0–10.5)

## 2023-09-23 LAB — URINALYSIS, ROUTINE W REFLEX MICROSCOPIC
Bilirubin Urine: NEGATIVE
Hgb urine dipstick: NEGATIVE
Ketones, ur: NEGATIVE
Leukocytes,Ua: NEGATIVE
Nitrite: NEGATIVE
Specific Gravity, Urine: 1.015 (ref 1.000–1.030)
Total Protein, Urine: NEGATIVE
Urine Glucose: NEGATIVE
Urobilinogen, UA: 0.2 (ref 0.0–1.0)
pH: 6 (ref 5.0–8.0)

## 2023-09-23 LAB — HEMOGLOBIN A1C: Hgb A1c MFr Bld: 5.7 % (ref 4.6–6.5)

## 2023-09-23 LAB — HEPATIC FUNCTION PANEL
ALT: 18 U/L (ref 0–35)
AST: 23 U/L (ref 0–37)
Albumin: 4.4 g/dL (ref 3.5–5.2)
Alkaline Phosphatase: 106 U/L (ref 39–117)
Bilirubin, Direct: 0.1 mg/dL (ref 0.0–0.3)
Total Bilirubin: 0.5 mg/dL (ref 0.2–1.2)
Total Protein: 7.6 g/dL (ref 6.0–8.3)

## 2023-09-23 LAB — LIPID PANEL
Cholesterol: 169 mg/dL (ref 0–200)
HDL: 81.6 mg/dL (ref >39.00)
LDL Cholesterol: 74 mg/dL (ref 0–99)
NonHDL: 87.61
Total CHOL/HDL Ratio: 2
Triglycerides: 68 mg/dL (ref 0.0–149.0)
VLDL: 13.6 mg/dL (ref 0.0–40.0)

## 2023-09-23 LAB — VITAMIN D 25 HYDROXY (VIT D DEFICIENCY, FRACTURES): VITD: 34.19 ng/mL (ref 30.00–100.00)

## 2023-09-23 LAB — TSH: TSH: 0.36 u[IU]/mL (ref 0.35–5.50)

## 2023-09-23 LAB — SEDIMENTATION RATE: Sed Rate: 32 mm/h — ABNORMAL HIGH (ref 0–30)

## 2023-09-23 NOTE — Patient Instructions (Addendum)
Good to see y ou today . Exam is ok. Doubt if the left arm pain is from heart but I agree should be followed .  EKG today  no acute findings  Checking labs from your rhuematologist and  other as planned.   will forward info to your team

## 2023-09-23 NOTE — Progress Notes (Signed)
No a cute findings   read as  low voltage but no sig changes x small r r' in v1

## 2023-09-25 LAB — TIQ- AMBIGUOUS ORDER

## 2023-09-26 ENCOUNTER — Telehealth: Payer: Self-pay

## 2023-09-26 ENCOUNTER — Encounter: Payer: Self-pay | Admitting: Internal Medicine

## 2023-09-26 DIAGNOSIS — R944 Abnormal results of kidney function studies: Secondary | ICD-10-CM

## 2023-09-26 NOTE — Telephone Encounter (Signed)
Contacted [facility] to follow up on Troponin T lab.   They inform that the test code order is inactive.   Need a new test code.   Another test code for Troponin T is [id number] And Troponin I is [id number].  They said they cannot run Troponin T as the specimen was not correct.   But can runs Troponin I.   Contacted provider, Dr. Fabian Sharp.   She is okay with doing Troponin I.

## 2023-09-26 NOTE — Telephone Encounter (Signed)
Follow up with [facility] and spoke to Dana [name].   Update her on the test order.   New test order is added.   Ask her about the correct order for Troponin T, she states the correct one would need to be include 'high sensitivity' for [facility].

## 2023-09-27 ENCOUNTER — Encounter: Payer: Self-pay | Admitting: Internal Medicine

## 2023-09-27 LAB — PROTEIN ELECTROPHORESIS, SERUM
Albumin ELP: 4.2 g/dL (ref 3.8–4.8)
Alpha 1: 0.3 g/dL (ref 0.2–0.3)
Alpha 2: 0.8 g/dL (ref 0.5–0.9)
Beta 2: 0.5 g/dL (ref 0.2–0.5)
Beta Globulin: 0.5 g/dL (ref 0.4–0.6)
Gamma Globulin: 1.1 g/dL (ref 0.8–1.7)
Total Protein: 7.4 g/dL (ref 6.1–8.1)

## 2023-09-27 LAB — COMPLETE METABOLIC PANEL WITH GFR
AG Ratio: 1.5 (calc) (ref 1.0–2.5)
ALT: 18 U/L (ref 6–29)
AST: 22 U/L (ref 10–35)
Albumin: 4.4 g/dL (ref 3.6–5.1)
Alkaline phosphatase (APISO): 117 U/L (ref 37–153)
BUN/Creatinine Ratio: 14 (calc) (ref 6–22)
BUN: 15 mg/dL (ref 7–25)
CO2: 25 mmol/L (ref 20–32)
Calcium: 10 mg/dL (ref 8.6–10.4)
Chloride: 104 mmol/L (ref 98–110)
Creat: 1.06 mg/dL — ABNORMAL HIGH (ref 0.50–1.05)
Globulin: 3 g/dL (ref 1.9–3.7)
Glucose, Bld: 85 mg/dL (ref 65–99)
Potassium: 4.2 mmol/L (ref 3.5–5.3)
Sodium: 138 mmol/L (ref 135–146)
Total Bilirubin: 0.4 mg/dL (ref 0.2–1.2)
Total Protein: 7.4 g/dL (ref 6.1–8.1)
eGFR: 57 mL/min/{1.73_m2} — ABNORMAL LOW (ref >=60)

## 2023-09-27 LAB — ANA TITER AND PATTERN REFLEX
ANA TITER: 1:80 {titer} — ABNORMAL HIGH
ANA TITER: >=1:1280 {titer} — AB
ANA Titer 1: 1:1280 {titer} — ABNORMAL HIGH

## 2023-09-27 LAB — TEST AUTHORIZATION: TEST NAME:: HIGH

## 2023-09-27 LAB — RHEUMATOID FACTOR: Rheumatoid fact SerPl-aCnc: 15 [IU]/mL — ABNORMAL HIGH (ref <14)

## 2023-09-27 LAB — SJOGRENS SYNDROME-B EXTRACTABLE NUCLEAR ANTIBODY: SSB (La) (ENA) Antibody, IgG: <1 AI

## 2023-09-27 LAB — C3 AND C4
C3 Complement: 159 mg/dL (ref 83–193)
C4 Complement: 35 mg/dL (ref 15–57)

## 2023-09-27 LAB — ANA SCREEN,IFA, WITH REFLEX TO TITER AND PATTERN (REFL): ANA SCREEN, IFA: POSITIVE — AB

## 2023-09-27 LAB — SJOGRENS SYNDROME-A EXTRACTABLE NUCLEAR ANTIBODY: SSA (Ro) (ENA) Antibody, IgG: 1.5 AI — AB

## 2023-09-27 LAB — TROPONIN I: Troponin I: 5 ng/L (ref <=47)

## 2023-09-29 ENCOUNTER — Telehealth: Payer: Self-pay | Admitting: Internal Medicine

## 2023-09-29 NOTE — Telephone Encounter (Signed)
Creatinine is borderline elevated-1.06.  avoid the use of NSAIDs.  Hepatic function panel wnl UA normal  Vitamin D WNL Complements WNL  ESR borderline elevated but stable-32. ANA and Ro antibody remain positive.  RF is borderline positive.  La antibody negative.  SPEP normal.

## 2023-09-29 NOTE — Telephone Encounter (Signed)
Patient advised Creatinine is borderline elevated-1.06.  Patient advised to avoid the use of NSAIDs.  Hepatic function panel wnl UA normal  Vitamin D WNL Complements WNL  ESR borderline elevated but stable-32. ANA and Ro antibody remain positive.  RF is borderline positive.  La antibody negative.  SPEP normal.

## 2023-09-29 NOTE — Telephone Encounter (Signed)
Pt called in and stated she is waiting for someone to reach out to her to go over her lab results. She states she is worried about them. Call back number: [phone]

## 2023-09-29 NOTE — Telephone Encounter (Signed)
Pt stated that she recently had labs drawn at Dr Fabian Sharp office and was requesting Dr. Leone Payor to review the labs and to give her a call when he gets a chance.

## 2023-09-30 NOTE — Telephone Encounter (Signed)
So  results  show positive  serology  tests regarding  your Sjogren. The creatinine I gfr. Is mildly decrease again.    I suggest  good  hydration  then  Do BMP and cystatin C and urine microscopic   dx  dec gfr.   I suggest  a renal ultrasound  to check image of kidneys  to be sure. ( Kidneys looked nl in Jan 24 on abd Ct scan) . All above to assess for any   kidney disease .

## 2023-09-30 NOTE — Telephone Encounter (Signed)
The troponin done result is normal and  is reassuring  neg for hear t injury  from the episode of pain you had .  ( Elevation would consider heart muscle injury)  poceed and order the renal ultrasound. As she agrees.

## 2023-09-30 NOTE — Telephone Encounter (Signed)
See other messages   let us know if there are other ?s but we can talk after the repeat lab work and renal ultrasound results are back.

## 2023-09-30 NOTE — Telephone Encounter (Signed)
Spoke to pt.   Pt reports she is concerns about her Troponin lab. What does the lab tell about her sx.   Pt wants to know when she should come back to recheck her lab.   Pt would like to do a renal ultrasound.   Please advise.

## 2023-10-01 ENCOUNTER — Encounter: Payer: Self-pay | Admitting: Internal Medicine

## 2023-10-01 NOTE — Telephone Encounter (Signed)
Noted. Other message was sent to pt via mychart.

## 2023-10-02 ENCOUNTER — Telehealth: Payer: Self-pay | Admitting: Internal Medicine

## 2023-10-02 ENCOUNTER — Other Ambulatory Visit: Payer: Self-pay

## 2023-10-02 MED ORDER — LEVOTHYROXINE SODIUM 88 MCG PO TABS: 88.0000 ug | ORAL_TABLET | Freq: Every day | ORAL | 1 refills | Status: DC

## 2023-10-02 NOTE — Telephone Encounter (Signed)
Spoke to pt. See other mychart message.

## 2023-10-02 NOTE — Telephone Encounter (Signed)
Spoke to Dana [name]. Inform her, the thyroid med is sent. Verbalized understanding.   Dana [name] follow up on when she should have her lab recheck. Advise her I will send her a mychart message once I get a response from Dr. Fabian Sharp.

## 2023-10-02 NOTE — Telephone Encounter (Signed)
Reviewed NL LFT's  She may f/u next summer w/ me Sooner prn

## 2023-10-02 NOTE — Telephone Encounter (Signed)
Before thanksgiving

## 2023-10-07 ENCOUNTER — Ambulatory Visit (HOSPITAL_BASED_OUTPATIENT_CLINIC_OR_DEPARTMENT_OTHER)
Admission: RE | Admit: 2023-10-07 | Discharge: 2023-10-07 | Disposition: A | Discharge status: Home / Self Care | Payer: BC Managed Care – PPO | Source: Ambulatory Visit | Attending: Internal Medicine | Admitting: Internal Medicine

## 2023-10-07 DIAGNOSIS — R944 Abnormal results of kidney function studies: Secondary | ICD-10-CM | POA: Diagnosis present

## 2023-10-07 NOTE — Progress Notes (Signed)
Results  of  renal US  are  reassuring  kidneys are normal  looking and size .

## 2023-10-10 NOTE — Telephone Encounter (Signed)
See mychart message encounter from 10/02/2023

## 2023-10-21 ENCOUNTER — Other Ambulatory Visit (INDEPENDENT_AMBULATORY_CARE_PROVIDER_SITE_OTHER): Payer: BC Managed Care – PPO

## 2023-10-21 DIAGNOSIS — R944 Abnormal results of kidney function studies: Secondary | ICD-10-CM

## 2023-10-21 LAB — BASIC METABOLIC PANEL
BUN: 16 mg/dL (ref 6–23)
CO2: 26 meq/L (ref 19–32)
Calcium: 10.4 mg/dL (ref 8.4–10.5)
Chloride: 104 meq/L (ref 96–112)
Creatinine, Ser: 1.02 mg/dL (ref 0.40–1.20)
GFR: 56.28 mL/min — ABNORMAL LOW (ref >60.00)
Glucose, Bld: 100 mg/dL — ABNORMAL HIGH (ref 70–99)
Potassium: 3.6 meq/L (ref 3.5–5.1)
Sodium: 138 meq/L (ref 135–145)

## 2023-10-21 LAB — URINALYSIS, MICROSCOPIC ONLY: RBC / HPF: NONE SEEN (ref 0–?)

## 2023-10-22 LAB — CYSTATIN C: CYSTATIN C: 0.97 mg/L (ref 0.72–1.16)

## 2023-10-22 NOTE — Progress Notes (Signed)
Forwarding results to Dr Durenda Age . If she hasn't already seen these. Abnormalities related to autoimmune  condition.

## 2023-10-22 NOTE — Progress Notes (Signed)
RF positive low titer ANA positive, SSA positive sed rate is mildly elevated and complements are normal.  Labs are unchanged.

## 2023-10-22 NOTE — Progress Notes (Signed)
So results are reassuring  urine shows no evidence of active reanla disease . Although creatinine is borderline low the cystatin C a  more refined indicator of GFR... is  in  normal  range  ( although  no calculated  egfr in report )  forwarding  FYI  results to Rheumatology team.

## 2023-11-21 ENCOUNTER — Other Ambulatory Visit: Payer: Self-pay | Admitting: Physician Assistant

## 2023-11-21 NOTE — Telephone Encounter (Signed)
Last Fill: 08/25/2023  Next Visit: Due March 2025. Message sent to the front to schedule.   Last Visit: 08/26/2023  Dx: Sjogren's syndrome with keratoconjunctivitis sicca   Current Dose per office note on 08/26/2023: pilocarpine 5 mg 1 tablet twice daily   Okay to refill Pilocarpine?

## 2023-11-21 NOTE — Telephone Encounter (Signed)
Please schedule patient a follow up visit. Patient due March 2025. Thanks!   Follow-Up Instructions: Return in 5 months (on 01/24/2024) for Sjogren's syndrome.

## 2024-01-02 ENCOUNTER — Other Ambulatory Visit: Payer: Self-pay | Admitting: Internal Medicine

## 2024-01-02 DIAGNOSIS — I10 Essential (primary) hypertension: Secondary | ICD-10-CM

## 2024-01-12 NOTE — Progress Notes (Deleted)
 Office Visit Note  Patient: Dana [name]             Date of Birth: November 24, 1956           MRN: [id number]             PCP: Madelin Headings, MD Referring: Madelin Headings, MD Visit Date: 01/26/2024 Occupation: @GUAROCC @  Subjective:  No chief complaint on file.   History of Present Illness: Dana [name] is a 68 y.o. female ***     Activities of Daily Living:  Patient reports morning stiffness for *** {minute/hour:19697}.   Patient {ACTIONS;DENIES/REPORTS:21021675::"Denies"} nocturnal pain.  Difficulty dressing/grooming: {ACTIONS;DENIES/REPORTS:21021675::"Denies"} Difficulty climbing stairs: {ACTIONS;DENIES/REPORTS:21021675::"Denies"} Difficulty getting out of chair: {ACTIONS;DENIES/REPORTS:21021675::"Denies"} Difficulty using hands for taps, buttons, cutlery, and/or writing: {ACTIONS;DENIES/REPORTS:21021675::"Denies"}  No Rheumatology ROS completed.   PMFS History:  Patient Active Problem List   Diagnosis Date Noted   Porokeratosis 06/16/2023   Capsulitis 06/16/2023   Abnormal gallbladder ultrasound - slightly thick wall 01/25/2022   Elevated blood pressure reading without diagnosis of hypertension 02/16/2020   Onychomycosis 08/05/2018   Sjogren's disease (HCC) 07/13/2018   Osteoporosis 07/13/2018   Vitamin D deficiency 07/06/2018   Primary biliary cholangitis (HCC) 04/08/2018   Tetanus, diphtheria, and acellular pertussis (Tdap) vaccination declined 04/08/2018   Trigger point of right shoulder region 03/24/2018   Slipped rib syndrome 03/19/2018   Nonallopathic lesion of rib cage 03/19/2018   Nonallopathic lesion of thoracic region 03/19/2018   Nonallopathic lesion of cervical region 03/19/2018   Low serum vitamin D 04/09/2016   PFO (patent foramen ovale)    Abnormal finding on MRI of brain 01/10/2015   Visit for preventive health examination 11/24/2014   Bilateral arm weakness  hx of  11/23/2014   Essential hypertension 11/23/2014   Left paraspinal back pain  10/13/2013   Chronic constipation 09/16/2013   ESR raised 08/23/2013   Menopausal hot flushes 04/27/2012   Other and unspecified hyperlipidemia 04/27/2012   Migraine with aura    Hyperlipidemia 01/03/2012   Hypothyroidism 03/21/2010   HYPERTENSION, UNSPECIFIED 03/21/2010   CEREBROVASCULAR ACCIDENT, HX OF 03/21/2010    Past Medical History:  Diagnosis Date   Abnormal finding on MRI of brain 01/10/2015   CVA (cerebral infarction) 2010   diplopia neg acute MRi whit matter changes neg LP    Elevated LFTs 11/23/2014   High blood pressure    readings   High cholesterol    Hip pain, acute 10/13/2013   Hx of varicella    Low bone density 07/13/2018   Migraine with aura    auras   Non-cardiac chest pain    hops and evaluation 2011 dr Riley Kill   Osteoporosis 07/13/2018   Other and unspecified hyperlipidemia 04/27/2012   Has been on meds for a number of years .  Low dose crestor .  Baseline reported as 280/    PFO (patent foramen ovale)    Moderate size   Pneumonia 2007   and flu hospitalized    Primary biliary cholangitis (HCC) 04/08/2018   Sjogren's disease (HCC)    Stroke (HCC)    on hrt double vision- possible blood clot on brain; Dr. Lesia Sago at [facility]    Thyroid disease     Family History  Adopted: Yes  Problem Relation Age of Onset   Alcohol abuse Mother    Pancreatic cancer Mother    Liver disease Mother    Healthy Daughter    Breast cancer Maternal Aunt    Stomach  cancer Neg Hx    Esophageal cancer Neg Hx    Colon cancer Neg Hx    Past Surgical History:  Procedure Laterality Date   BREAST SURGERY     implants   COLONOSCOPY     FOOT SURGERY     had bunyon removed   TONSILLECTOMY     TOOTH EXTRACTION  11/30/2018   TUBAL LIGATION     Social History   Social History Narrative   Hole between heart and lung-untreatable    Married    orig from  [place]   hh of 2   Pet dog euthanized    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    Neg tad   Some caffiene    Seatbelts no ets fa stored    G1P1   Physically active     5 yo grandaughter    Patient is left handed.      Immunization History  Administered Date(s) Administered   Fluad Quad(high Dose 65+) 09/10/2019, 11/14/2020   Influenza Split 09/08/2012   Influenza,inj,Quad PF,6+ Mos 08/23/2013, 11/16/2014, 09/07/2015, 08/23/2016, 09/02/2017, 09/08/2018   Moderna Sars-Covid-2 Vaccination 07/17/2020, 08/14/2020   Pneumococcal Conjugate-13 04/26/2011, 09/10/2019   Pneumococcal Polysaccharide-23 06/12/2021   Zoster Recombinant(Shingrix) 06/12/2021, 09/12/2021     Objective: Vital Signs: LMP 04/25/2009    Physical Exam   Musculoskeletal Exam: ***  CDAI Exam: CDAI Score: -- Patient Global: --; Provider Global: -- Swollen: --; Tender: -- Joint Exam 01/26/2024   No joint exam has been documented for this visit   There is currently no information documented on the homunculus. Go to the Rheumatology activity and complete the homunculus joint exam.  Investigation: No additional findings.  Imaging: No results found.  Recent Labs: Lab Results  Component Value Date   WBC 5.8 09/23/2023   HGB 11.8 (L) 09/23/2023   PLT 226.0 09/23/2023   NA 138 10/21/2023   K 3.6 10/21/2023   CL 104 10/21/2023   CO2 26 10/21/2023   GLUCOSE 100 (H) 10/21/2023   BUN 16 10/21/2023   CREATININE 1.02 10/21/2023   BILITOT 0.4 09/23/2023   BILITOT 0.5 09/23/2023   ALKPHOS 106 09/23/2023   AST 22 09/23/2023   AST 23 09/23/2023   ALT 18 09/23/2023   ALT 18 09/23/2023   PROT 7.4 09/23/2023   PROT 7.4 09/23/2023   PROT 7.6 09/23/2023   ALBUMIN 4.4 09/23/2023   CALCIUM 10.4 10/21/2023   GFRAA 78 01/11/2021   QFTBGOLDPLUS NEGATIVE 03/06/2022    Speciality Comments: PLQ Eye Exam: 09/25/18 WNL @ [facility] follow up 1 year  Procedures:  No procedures performed Allergies: Estrogens, Codeine, Ibuprofen, and Sulfa antibiotics    Assessment / Plan:     Visit Diagnoses: Sjogren's syndrome with keratoconjunctivitis sicca (HCC)  High risk medication use  Positive ANA (antinuclear antibody)  Primary biliary cholangitis (HCC)  Osteopenia of both hips  Vitamin D deficiency  Patent foramen ovale  History of hyperlipidemia  History of stroke  History of hypothyroidism  History of recent fall  Orders: No orders of the defined types were placed in this encounter.  No orders of the defined types were placed in this encounter.   Face-to-face time spent with patient was *** minutes. Greater than 50% of time was spent in counseling and coordination of care.  Follow-Up Instructions: No follow-ups on file.   Gearldine Bienenstock, PA-C  Note - This record  has been created using [employer].  Chart creation errors have been sought, but may not always  have been located. Such creation errors do not reflect on  the standard of medical care.

## 2024-01-26 ENCOUNTER — Ambulatory Visit: Payer: BC Managed Care – PPO | Admitting: Physician Assistant

## 2024-01-26 DIAGNOSIS — E559 Vitamin D deficiency, unspecified: Secondary | ICD-10-CM

## 2024-01-26 DIAGNOSIS — M3501 Sicca syndrome with keratoconjunctivitis: Secondary | ICD-10-CM

## 2024-01-26 DIAGNOSIS — M85851 Other specified disorders of bone density and structure, right thigh: Secondary | ICD-10-CM

## 2024-01-26 DIAGNOSIS — K743 Primary biliary cirrhosis: Secondary | ICD-10-CM

## 2024-01-26 DIAGNOSIS — R768 Other specified abnormal immunological findings in serum: Secondary | ICD-10-CM

## 2024-01-26 DIAGNOSIS — Z8639 Personal history of other endocrine, nutritional and metabolic disease: Secondary | ICD-10-CM

## 2024-01-26 DIAGNOSIS — Z9181 History of falling: Secondary | ICD-10-CM

## 2024-01-26 DIAGNOSIS — Z79899 Other long term (current) drug therapy: Secondary | ICD-10-CM

## 2024-01-26 DIAGNOSIS — Z8673 Personal history of transient ischemic attack (TIA), and cerebral infarction without residual deficits: Secondary | ICD-10-CM

## 2024-01-26 DIAGNOSIS — Q2112 Patent foramen ovale: Secondary | ICD-10-CM

## 2024-02-09 NOTE — Progress Notes (Unsigned)
 Office Visit Note  Patient: Dana [name]             Date of Birth: [birth year]           MRN: [id number]             PCP: Madelin Headings, MD Referring: Madelin Headings, MD Visit Date: 02/10/2024 Occupation: @GUAROCC @  Subjective:  Mass under chin   History of Present Illness: Dana [name] is a 68 y.o. female with history of sjogren's syndrome.  Patient presents today for further evaluation of a mass underneath her chin.  Patient states that on Friday and Saturday she had some fleeting pain under her chin and on Sunday she noted a mass.  Due to her history of Sjogren's syndrome she is concerned about the diagnosis of lymphoma.  Patient states that she has been experiencing night sweats for the past 3 years and has ongoing fatigue.  She is also noticed easy bruising for the past 3 months.  She is taking aspirin 81 mg daily.  She denies any nosebleed or any other signs of excessive blood loss.  She denies any other swollen lymph nodes at this time.  Patient continues to have chronic sicca symptoms which have been severe.  She has been taking pilocarpine 5 mg 1 tablet twice daily and using xiidra eyedrops twice daily.  Patient continues to see her dentist every 6 months as advised.       Activities of Daily Living:  Patient reports morning stiffness for 0 minutes.   Patient Denies nocturnal pain.  Difficulty dressing/grooming: Denies Difficulty climbing stairs: Denies Difficulty getting out of chair: Denies Difficulty using hands for taps, buttons, cutlery, and/or writing: Denies  Review of Systems  Constitutional:  Positive for fatigue.  HENT:  Positive for mouth dryness. Negative for mouth sores.   Eyes:  Positive for dryness.  Respiratory:  Negative for shortness of breath.   Cardiovascular:  Positive for swelling in legs/feet. Negative for chest pain and palpitations.  Gastrointestinal:  Positive for constipation. Negative for blood in stool and diarrhea.  Endocrine: Negative for  increased urination.  Genitourinary:  Negative for involuntary urination.  Musculoskeletal:  Negative for joint pain, gait problem, joint pain, joint swelling, myalgias, muscle weakness, morning stiffness, muscle tenderness and myalgias.  Skin:  Positive for hair loss. Negative for color change, rash and sensitivity to sunlight.  Allergic/Immunologic: Negative for susceptible to infections.  Neurological:  Negative for dizziness and headaches.  Hematological:  Negative for swollen glands.  Psychiatric/Behavioral:  Negative for depressed mood and sleep disturbance. The patient is nervous/anxious.     PMFS History:  Patient Active Problem List   Diagnosis Date Noted   Porokeratosis 06/16/2023   Capsulitis 06/16/2023   Abnormal gallbladder ultrasound - slightly thick wall 01/25/2022   Elevated blood pressure reading without diagnosis of hypertension 02/16/2020   Onychomycosis 08/05/2018   Sjogren's disease (HCC) 07/13/2018   Osteoporosis 07/13/2018   Vitamin D deficiency 07/06/2018   Primary biliary cholangitis (HCC) 04/08/2018   Tetanus, diphtheria, and acellular pertussis (Tdap) vaccination declined 04/08/2018   Trigger point of right shoulder region 03/24/2018   Slipped rib syndrome 03/19/2018   Nonallopathic lesion of rib cage 03/19/2018   Nonallopathic lesion of thoracic region 03/19/2018   Nonallopathic lesion of cervical region 03/19/2018   Low serum vitamin D 04/09/2016   PFO (patent foramen ovale)    Abnormal finding on MRI of brain 01/10/2015   Visit for preventive health examination 11/24/2014  Bilateral arm weakness  hx of  11/23/2014   Essential hypertension 11/23/2014   Left paraspinal back pain 10/13/2013   Chronic constipation 09/16/2013   ESR raised 08/23/2013   Menopausal hot flushes 04/27/2012   Other and unspecified hyperlipidemia 04/27/2012   Migraine with aura    Hyperlipidemia 01/03/2012   Hypothyroidism 03/21/2010   HYPERTENSION, UNSPECIFIED 03/21/2010    CEREBROVASCULAR ACCIDENT, HX OF 03/21/2010    Past Medical History:  Diagnosis Date   Abnormal finding on MRI of brain 01/10/2015   CVA (cerebral infarction) 2010   diplopia neg acute MRi whit matter changes neg LP    Elevated LFTs 11/23/2014   High blood pressure    readings   High cholesterol    Hip pain, acute 10/13/2013   Hx of varicella    Low bone density 07/13/2018   Migraine with aura    auras   Non-cardiac chest pain    hops and evaluation 2011 dr Riley Kill   Osteoporosis 07/13/2018   Other and unspecified hyperlipidemia 04/27/2012   Has been on meds for a number of years .  Low dose crestor .  Baseline reported as 280/    PFO (patent foramen ovale)    Moderate size   Pneumonia 2007   and flu hospitalized    Primary biliary cholangitis (HCC) 04/08/2018   Sjogren's disease (HCC)    Stroke (HCC)    on hrt double vision- possible blood clot on brain; Dr. Lesia Sago at [facility]    Thyroid disease     Family History  Adopted: Yes  Problem Relation Age of Onset   Alcohol abuse Mother    Pancreatic cancer Mother    Liver disease Mother    Healthy Daughter    Breast cancer Maternal Aunt    Stomach cancer Neg Hx    Esophageal cancer Neg Hx    Colon cancer Neg Hx    Past Surgical History:  Procedure Laterality Date   BREAST SURGERY     implants   COLONOSCOPY     FOOT SURGERY     had bunyon removed   TONSILLECTOMY     TOOTH EXTRACTION  11/30/2018   TUBAL LIGATION     Social History   Social History Narrative   Hole between heart and lung-untreatable    Married    orig from  [place]   hh of 2   Pet dog euthanized    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    Neg tad   Some caffiene    Seatbelts no ets fa stored    G1P1   Physically active     5 yo grandaughter    Patient is left handed.      Immunization History  Administered Date(s) Administered   Fluad Quad(high Dose 65+) 09/10/2019,  11/14/2020   Influenza Split 09/08/2012   Influenza,inj,Quad PF,6+ Mos 08/23/2013, 11/16/2014, 09/07/2015, 08/23/2016, 09/02/2017, 09/08/2018   Moderna Sars-Covid-2 Vaccination 07/17/2020, 08/14/2020   Pneumococcal Conjugate-13 04/26/2011, 09/10/2019   Pneumococcal Polysaccharide-23 06/12/2021   Zoster Recombinant(Shingrix) 06/12/2021, 09/12/2021     Objective: Vital Signs: BP (!) 149/89 (BP Location: Left Arm, Patient Position: Sitting, Cuff Size: Normal)   Pulse 68   Resp 14   Ht 5' 3.5" (1.613 m)   Wt 130 lb 9.6 oz (59.2 kg)   LMP 04/25/2009   BMI 22.77 kg/m    Physical Exam Vitals and nursing note reviewed.  Constitutional:      Appearance: She is well-developed.  HENT:     Head: Normocephalic and atraumatic.  Eyes:     Conjunctiva/sclera: Conjunctivae normal.  Neck:     Comments: Small submental mass  Cardiovascular:     Rate and Rhythm: Normal rate and regular rhythm.     Heart sounds: Normal heart sounds.  Pulmonary:     Effort: Pulmonary effort is normal.     Breath sounds: Normal breath sounds.  Abdominal:     General: Bowel sounds are normal.     Palpations: Abdomen is soft.  Musculoskeletal:     Cervical back: Normal range of motion.  Skin:    General: Skin is warm and dry.     Capillary Refill: Capillary refill takes less than 2 seconds.  Neurological:     Mental Status: She is alert and oriented to person, place, and time.  Psychiatric:        Behavior: Behavior normal.      Musculoskeletal Exam: C-spine, thoracic spine, and lumbar spine good ROM.  Shoulder joints, elbow joints, wrist joints have good range of motion without discomfort.  No tenderness or synovitis over MCP joints.  Hip joints have good range of motion with no groin pain.  Knee joints have good range of motion with no warmth or effusion.  Ankle joints have good range of motion with no tenderness or joint swelling.  CDAI Exam: CDAI Score: -- Patient Global: --; Provider Global:  -- Swollen: --; Tender: -- Joint Exam 02/10/2024   No joint exam has been documented for this visit   There is currently no information documented on the homunculus. Go to the Rheumatology activity and complete the homunculus joint exam.  Investigation: No additional findings.  Imaging: No results found.  Recent Labs: Lab Results  Component Value Date   WBC 5.8 09/23/2023   HGB 11.8 (L) 09/23/2023   PLT 226.0 09/23/2023   NA 138 10/21/2023   K 3.6 10/21/2023   CL 104 10/21/2023   CO2 26 10/21/2023   GLUCOSE 100 (H) 10/21/2023   BUN 16 10/21/2023   CREATININE 1.02 10/21/2023   BILITOT 0.4 09/23/2023   BILITOT 0.5 09/23/2023   ALKPHOS 106 09/23/2023   AST 22 09/23/2023   AST 23 09/23/2023   ALT 18 09/23/2023   ALT 18 09/23/2023   PROT 7.4 09/23/2023   PROT 7.4 09/23/2023   PROT 7.6 09/23/2023   ALBUMIN 4.4 09/23/2023   CALCIUM 10.4 10/21/2023   GFRAA 78 01/11/2021   QFTBGOLDPLUS NEGATIVE 03/06/2022    Speciality Comments: PLQ Eye Exam: 09/25/18 WNL @ [facility] follow up 1 year  Procedures:  No procedures performed Allergies: Estrogens, Codeine, Ibuprofen, and Sulfa antibiotics   Assessment / Plan:     Visit Diagnoses: Sjogren's syndrome with keratoconjunctivitis sicca (HCC) - ANA+, Ro+: Patient continues to have severe chronic sicca symptoms.  She has been taking pilocarpine 5 mg 1 tablet twice daily and using Xiidra eyedrops twice daily for symptomatic relief.  She continues to see the dentist every 6 months as encouraged.  She is not currently taking any immunomodulators or immunosuppressive agent.  She has no signs of inflammatory arthritis at this time. Patient presents today for further evaluation of a mass in the submental region of unknown duration.  No other cervical lymphadenopathy noted.  Patient has not had night sweats for the past 3 years and has ongoing fatigue.  No unintentional weight loss or fevers.  Offered to schedule  an ultrasound for  further evaluation.  Dr. Corliss Skains recommended evaluation by general surgery-referral placed today.  Lab work from 09/23/23 was reviewed today in the office: ANA+, Ro antibody remains positive, SPEP-no abnormal protein bands, RF 15, ESR 32, La antibody negative, complements WNL.  The following lab work will be updated today.  She has a follow up visit scheduled with Dr. Corliss Skains on 07/15/24.  - Plan: CBC with Differential/Platelet, COMPLETE METABOLIC PANEL WITH GFR, C3 and C4, Sedimentation rate, Serum protein electrophoresis with reflex  High risk medication use - She was started on PLQ in October 2019 but discontinued after 2 weeks due to developing tinnitus and constipation.  Not currently taking any immunosuppressive agents. - Plan: CBC with Differential/Platelet, COMPLETE METABOLIC PANEL WITH GFR  Positive ANA (antinuclear antibody) - August 11, 2018 AVISE index 0.2, ANA 1: 5120 homogeneous, SSA positive, dsDNA negative, Smith negative, RNP negative, SSB Negative, SCL 70 is negative: Lab work from 09/23/23 was reviewed today in the office: ANA+, Ro antibody remains positive, SPEP-no abnormal protein bands, RF 15, ESR 32, La antibody negative, complements WNL.  Patient requested to hold off on updating ANA and ENA panel at this time.  The following lab work will be updated today to assess for active disease.   - Plan: CBC with Differential/Platelet, COMPLETE METABOLIC PANEL WITH GFR, C3 and C4, Sedimentation rate, Serum protein electrophoresis with reflex  Mass of submental region: Patient noticed some fleeting pain under her chin Friday and Saturday and on Sunday felt a small mass under her chin.  The mass is now nontender.  She denies any recent infections and continues to see her dentist every 6 months.  She is unsure when the mass presented and is concerned it has been there for some time.  She has not noticed any other cervical lymphadenopathy.  She has had night sweats for the past 3 years  and has ongoing fatigue.  No and unintentional weight loss.  She has noticed easy bruising but is currently on aspirin 81 mg daily.  No recent fevers.  She is concerned about the possible diagnosis of lymphoma given history of Sjogren's syndrome.  Discussed that an ultrasound may be necessary for further evaluation.  Patient requested a MRI to be ordered but I discussed that her insurance may not cover an MRI.  Evaluated by Dr. Corliss Skains today in the office as well--Dr. Corliss Skains recommended evaluation by general surgery. Referral will be placed today.  SPEP, CBC with diff, ESR, and complements updated today.   Primary biliary cholangitis (HCC) - Under care of Dr. Aileen Fass office visit note from 04/03/2023  Osteopenia of both hips - DEXA 07/06/2018 RFN T score of -2.4 and LFN -2.0. DEXA updated on 03/12/2021: Right femoral neck T-score -2.7.  Left femoral neck T-score -2.4. Previously prescribed fosamax. Followed by PCP.  Other medical conditions are listed as follows:   Vitamin D deficiency  Patent foramen ovale  History of hyperlipidemia  History of stroke  History of hypothyroidism  History of recent fall    Orders: Orders Placed This Encounter  Procedures   CBC with Differential/Platelet   COMPLETE METABOLIC PANEL WITH GFR   C3 and C4   Sedimentation rate   Serum protein electrophoresis with reflex   No orders of the defined types were placed in this encounter.   Follow-Up Instructions: Return in about 5 months (around 07/12/2024) for Sjogren's syndrome.   Gearldine Bienenstock, PA-C  Note - This record has been created using Dragon software.  Chart creation errors have been sought, but may not always  have been located. Such creation errors do not reflect on  the standard of medical care.

## 2024-02-10 ENCOUNTER — Encounter: Payer: Self-pay | Admitting: Rheumatology

## 2024-02-10 ENCOUNTER — Telehealth: Payer: Self-pay | Admitting: Rheumatology

## 2024-02-10 ENCOUNTER — Ambulatory Visit: Attending: Physician Assistant | Admitting: Physician Assistant

## 2024-02-10 ENCOUNTER — Encounter: Payer: Self-pay | Admitting: Physician Assistant

## 2024-02-10 VITALS — BP 149/89 | HR 68 | Resp 14 | Ht 63.5 in | Wt 130.6 lb

## 2024-02-10 DIAGNOSIS — Z9181 History of falling: Secondary | ICD-10-CM

## 2024-02-10 DIAGNOSIS — M85852 Other specified disorders of bone density and structure, left thigh: Secondary | ICD-10-CM

## 2024-02-10 DIAGNOSIS — Z8639 Personal history of other endocrine, nutritional and metabolic disease: Secondary | ICD-10-CM

## 2024-02-10 DIAGNOSIS — Z79899 Other long term (current) drug therapy: Secondary | ICD-10-CM

## 2024-02-10 DIAGNOSIS — R7689 Other specified abnormal immunological findings in serum: Secondary | ICD-10-CM

## 2024-02-10 DIAGNOSIS — M3501 Sicca syndrome with keratoconjunctivitis: Secondary | ICD-10-CM

## 2024-02-10 DIAGNOSIS — K743 Primary biliary cirrhosis: Secondary | ICD-10-CM

## 2024-02-10 DIAGNOSIS — R768 Other specified abnormal immunological findings in serum: Secondary | ICD-10-CM

## 2024-02-10 DIAGNOSIS — Z8673 Personal history of transient ischemic attack (TIA), and cerebral infarction without residual deficits: Secondary | ICD-10-CM

## 2024-02-10 DIAGNOSIS — Q2112 Patent foramen ovale: Secondary | ICD-10-CM

## 2024-02-10 DIAGNOSIS — M85851 Other specified disorders of bone density and structure, right thigh: Secondary | ICD-10-CM

## 2024-02-10 DIAGNOSIS — R221 Localized swelling, mass and lump, neck: Secondary | ICD-10-CM

## 2024-02-10 DIAGNOSIS — E559 Vitamin D deficiency, unspecified: Secondary | ICD-10-CM

## 2024-02-10 NOTE — Telephone Encounter (Signed)
 Pt called stating the issue has been resolved with the lump she had and will not need to see a surgeon. Pt had an appt today

## 2024-02-10 NOTE — Telephone Encounter (Signed)
[place] to cancel referral

## 2024-02-11 NOTE — Progress Notes (Signed)
Complements WNL

## 2024-02-11 NOTE — Progress Notes (Signed)
 Creatinine is elevated-1.08 and GFR is low-56. Rest of CMP WNL.   CBC WNL ESR WNL

## 2024-02-12 LAB — PROTEIN ELECTROPHORESIS, SERUM, WITH REFLEX
Albumin ELP: 4.2 g/dL (ref 3.8–4.8)
Alpha 1: 0.3 g/dL (ref 0.2–0.3)
Alpha 2: 0.8 g/dL (ref 0.5–0.9)
Beta 2: 0.4 g/dL (ref 0.2–0.5)
Beta Globulin: 0.5 g/dL (ref 0.4–0.6)
Gamma Globulin: 1.1 g/dL (ref 0.8–1.7)
Total Protein: 7.3 g/dL (ref 6.1–8.1)

## 2024-02-12 LAB — CBC WITH DIFFERENTIAL/PLATELET
Absolute Lymphocytes: 935 {cells}/uL (ref 850–3900)
Absolute Monocytes: 549 {cells}/uL (ref 200–950)
Basophils Absolute: 50 {cells}/uL (ref 0–200)
Basophils Relative: 0.9 %
Eosinophils Absolute: 190 {cells}/uL (ref 15–500)
Eosinophils Relative: 3.4 %
HCT: 37.9 % (ref 35.0–45.0)
Hemoglobin: 12.3 g/dL (ref 11.7–15.5)
MCH: 29.9 pg (ref 27.0–33.0)
MCHC: 32.5 g/dL (ref 32.0–36.0)
MCV: 92.2 fL (ref 80.0–100.0)
MPV: 12.3 fL (ref 7.5–12.5)
Monocytes Relative: 9.8 %
Neutro Abs: 3875 {cells}/uL (ref 1500–7800)
Neutrophils Relative %: 69.2 %
Platelets: 233 10*3/uL (ref 140–400)
RBC: 4.11 10*6/uL (ref 3.80–5.10)
RDW: 12.5 % (ref 11.0–15.0)
Total Lymphocyte: 16.7 %
WBC: 5.6 10*3/uL (ref 3.8–10.8)

## 2024-02-12 LAB — COMPREHENSIVE METABOLIC PANEL
AG Ratio: 1.7 (calc) (ref 1.0–2.5)
ALT: 19 U/L (ref 6–29)
AST: 24 U/L (ref 10–35)
Albumin: 4.5 g/dL (ref 3.6–5.1)
Alkaline phosphatase (APISO): 105 U/L (ref 37–153)
BUN/Creatinine Ratio: 14 (calc) (ref 6–22)
BUN: 15 mg/dL (ref 7–25)
CO2: 25 mmol/L (ref 20–32)
Calcium: 10.1 mg/dL (ref 8.6–10.4)
Chloride: 106 mmol/L (ref 98–110)
Creat: 1.08 mg/dL — ABNORMAL HIGH (ref 0.50–1.05)
Globulin: 2.7 g/dL (ref 1.9–3.7)
Glucose, Bld: 84 mg/dL (ref 65–99)
Potassium: 4.8 mmol/L (ref 3.5–5.3)
Sodium: 140 mmol/L (ref 135–146)
Total Bilirubin: 0.4 mg/dL (ref 0.2–1.2)
Total Protein: 7.2 g/dL (ref 6.1–8.1)
eGFR: 56 mL/min/{1.73_m2} — ABNORMAL LOW (ref >=60)

## 2024-02-12 LAB — C3 AND C4
C3 Complement: 158 mg/dL (ref 83–193)
C4 Complement: 34 mg/dL (ref 15–57)

## 2024-02-12 LAB — SEDIMENTATION RATE: Sed Rate: 28 mm/h (ref 0–30)

## 2024-02-13 NOTE — Progress Notes (Signed)
 SPEP  normal.

## 2024-02-22 ENCOUNTER — Other Ambulatory Visit: Payer: Self-pay | Admitting: Rheumatology

## 2024-02-23 NOTE — Telephone Encounter (Signed)
 Last Fill: 11/21/2023  Next Visit: 07/15/2024  Last Visit: 02/10/2024  Dx: Sjogren's syndrome with keratoconjunctivitis sicca   Current Dose per office note on 02/10/2024: pilocarpine 5 mg 1 tablet twice daily   Okay to refill Pilocarpine?

## 2024-05-06 HISTORY — PX: NECK LIFT: SHX6573

## 2024-05-07 ENCOUNTER — Other Ambulatory Visit: Payer: Self-pay | Admitting: Internal Medicine

## 2024-05-21 ENCOUNTER — Ambulatory Visit: Admitting: Internal Medicine

## 2024-05-21 ENCOUNTER — Encounter: Payer: Self-pay | Admitting: Internal Medicine

## 2024-05-21 ENCOUNTER — Other Ambulatory Visit: Payer: Self-pay | Admitting: Physician Assistant

## 2024-05-21 VITALS — BP 122/84 | HR 86 | Ht 63.5 in | Wt 130.0 lb

## 2024-05-21 DIAGNOSIS — K5909 Other constipation: Secondary | ICD-10-CM

## 2024-05-21 DIAGNOSIS — K743 Primary biliary cirrhosis: Secondary | ICD-10-CM

## 2024-05-21 NOTE — Patient Instructions (Addendum)
 As we discussed - the PBC is under control and liver is ok.  Continue MiraLax.  I appreciate the opportunity to care for you.  Dana [name] [name], Dana [name], Dana [name]

## 2024-05-21 NOTE — Telephone Encounter (Signed)
 Last Fill: 02/23/2024  Next Visit: 07/15/2024  Last Visit: 02/10/2024  Dx: Sjogren's syndrome with keratoconjunctivitis sicca   Current Dose per office note on 02/10/2024: pilocarpine  5 mg 1 tablet twice daily   Okay to refill Pilocarpine ?

## 2024-05-21 NOTE — Progress Notes (Signed)
 Dana [name] 68 y.o. 1955-12-15 [id number]  Assessment & Plan:   Encounter Diagnoses  Name Primary?   Primary biliary cholangitis (HCC) Yes   Chronic constipation     Continue Ursodeoxycholic acid for PBC.  That is under control with normal alkaline phosphatase.  Clinic follow-up 1 year.  Annual LFTs, she has LFTs at least once a year usually more than that through her other providers.  Continue MiraLAX for chronic constipation.   Subjective:  Patient consented to the use of artificial intelligence scribe application Chief Complaint: Primary biliary cholangitis  HPI Dana [name] is a 68 year old female with primary biliary cholangitis who presents for follow-up.  She wants to know if her liver is okay.  She has been managing primary biliary cholangitis for six years and is currently on Ursodone twice daily without experiencing diarrhea. She prefers to avoid frequent lab tests due to a dislike of needles.  She has a lifelong history of constipation and uses MiraLAX comfortably. In 2024, she had a concern about a possible parasite in her stool, though testing did not confirm this. She remains worried about a potential parasitic cause for her constipation.  She also has Sjogren's syndrome, which results in dry mouth. She uses Xiidra eye drops twice daily to manage her symptoms and sees her rheumatologist every six months. Colonoscopy 01/07/23 sigmoid diverticulosis  Lab Results  Component Value Date   ALT 19 02/10/2024   AST 24 02/10/2024   ALKPHOS 106 09/23/2023   BILITOT 0.4 02/10/2024  CT scan January 2024, normal liver   Allergies  Allergen Reactions   Estrogens Other (See Comments)    Had stroke on HRT/OCP   Codeine     REACTION: nausea   Ibuprofen     REACTION: stomach pain   Sulfa Antibiotics Itching    Pt reported on 05/03/2020   Current Meds  Medication Sig   aspirin 81 MG tablet Take 81 mg by mouth daily.   atorvastatin  (LIPITOR) 20 MG tablet TAKE 1  TABLET BY MOUTH EVERY DAY   levothyroxine  (SYNTHROID ) 88 MCG tablet Take 1 tablet (88 mcg total) by mouth daily. Dosage change   losartan  (COZAAR ) 50 MG tablet TAKE 1 TABLET BY MOUTH EVERY DAY   pilocarpine  (SALAGEN ) 5 MG tablet TAKE 1 TABLET BY MOUTH TWICE A DAY   polyethylene glycol (MIRALAX / GLYCOLAX) 17 g packet Take 17 g by mouth daily.   ursodiol  (ACTIGALL ) 250 MG tablet TAKE 1 TABLET BY MOUTH TWICE A DAY   XIIDRA 5 % SOLN Apply 1 drop to eye 2 (two) times daily.   Past Medical History:  Diagnosis Date   Abnormal finding on MRI of brain 01/10/2015   CVA (cerebral infarction) 2010   diplopia neg acute MRi whit matter changes neg LP    Elevated LFTs 11/23/2014   High blood pressure    readings   High cholesterol    Hip pain, acute 10/13/2013   Hx of varicella    Low bone density 07/13/2018   Migraine with aura    auras   Non-cardiac chest pain    hops and evaluation 2011 dr Morris   Osteoporosis 07/13/2018   Other and unspecified hyperlipidemia 04/27/2012   Has been on meds for a number of years .  Low dose crestor  .  Baseline reported as 280/    PFO (patent foramen ovale)    Moderate size   Pneumonia 2007   and flu hospitalized    Primary biliary  cholangitis (HCC) 04/08/2018   Sjogren's disease (HCC)    Stroke (HCC)    on hrt double vision- possible blood clot on brain; Dr. 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    Neg tad   Some caffiene    Seatbelts no ets fa stored    G1P1    Physically active     5 yo grandaughter    Patient is left handed.      family history includes Alcohol abuse in her mother; Breast cancer in her maternal aunt; Healthy in her daughter; Liver disease in her mother; Pancreatic cancer in her mother. She was adopted.   Review of Systems As above  Objective:   Physical Exam @BP  122/84   Pulse 86   Ht 5' 3.5 (1.613 m)   Wt 130 lb (59 kg)   LMP 04/25/2009   BMI 22.67 kg/m @  General:  NAD Eyes:   anicteric Lungs:  clear Heart::  S1S2 no rubs, murmurs or gallops Abdomen:  soft and nontender, BS+, no HSM/mass     Data Reviewed:  See HPI

## 2024-05-26 ENCOUNTER — Ambulatory Visit: Admitting: Internal Medicine

## 2024-06-11 ENCOUNTER — Other Ambulatory Visit: Payer: Self-pay | Admitting: Internal Medicine

## 2024-06-26 ENCOUNTER — Other Ambulatory Visit: Payer: Self-pay | Admitting: Internal Medicine

## 2024-06-30 NOTE — Telephone Encounter (Signed)
 K follow up on this  error code

## 2024-06-30 NOTE — Telephone Encounter (Signed)
 Yes ok to change generic manufacturer and inform patient and make sure we can discuss at visit in October.

## 2024-06-30 NOTE — Telephone Encounter (Signed)
 Noted

## 2024-07-01 NOTE — Progress Notes (Signed)
 Office Visit Note  Patient: Dana [name]             Date of Birth: [birth year]           MRN: [id number]             PCP: Charlett Apolinar POUR, MD Referring: Charlett Apolinar POUR, MD Visit Date: 07/15/2024 Occupation: @GUAROCC @  Subjective:  Fatigue  History of Present Illness: Dana [name] is a 68 y.o. female with Sjogren's and osteopenia.  She returns today for after her last visit in March 2025.  She states she continues to have dry mouth and dry eye symptoms which are manageable with pilocarpine  and eyedrops.  She has been experiencing increased fatigue recently.  She denies any history of malar rash, Raynaud's phenomenon, joint pain or joint swelling.    Activities of Daily Living:  Patient reports morning stiffness for 0 minutes.   Patient Denies nocturnal pain.  Difficulty dressing/grooming: Denies Difficulty climbing stairs: Denies Difficulty getting out of chair: Denies Difficulty using hands for taps, buttons, cutlery, and/or writing: Denies  Review of Systems  Constitutional:  Negative for fatigue.  HENT:  Positive for mouth dryness. Negative for mouth sores.   Eyes:  Positive for dryness.  Respiratory:  Negative for shortness of breath.   Cardiovascular:  Negative for chest pain and palpitations.  Gastrointestinal:  Positive for constipation. Negative for blood in stool and diarrhea.  Endocrine: Negative for increased urination.  Genitourinary:  Negative for involuntary urination.  Musculoskeletal:  Negative for joint pain, gait problem, joint pain, joint swelling, myalgias, muscle weakness, morning stiffness, muscle tenderness and myalgias.  Skin:  Negative for color change, rash, hair loss and sensitivity to sunlight.  Allergic/Immunologic: Negative for susceptible to infections.  Neurological:  Positive for headaches. Negative for dizziness.  Hematological:  Negative for swollen glands.  Psychiatric/Behavioral:  Negative for depressed mood and sleep disturbance. The  patient is not nervous/anxious.     PMFS History:  Patient Active Problem List   Diagnosis Date Noted   Porokeratosis 06/16/2023   Capsulitis 06/16/2023   Abnormal gallbladder ultrasound - slightly thick wall 01/25/2022   Elevated blood pressure reading without diagnosis of hypertension 02/16/2020   Onychomycosis 08/05/2018   Sjogren's disease (HCC) 07/13/2018   Osteoporosis 07/13/2018   Vitamin D  deficiency 07/06/2018   Primary biliary cholangitis (HCC) 04/08/2018   Tetanus, diphtheria, and acellular pertussis (Tdap) vaccination declined 04/08/2018   Trigger point of right shoulder region 03/24/2018   Slipped rib syndrome 03/19/2018   Nonallopathic lesion of rib cage 03/19/2018   Nonallopathic lesion of thoracic region 03/19/2018   Nonallopathic lesion of cervical region 03/19/2018   Low serum vitamin D  04/09/2016   PFO (patent foramen ovale)    Abnormal finding on MRI of brain 01/10/2015   Visit for preventive health examination 11/24/2014   Bilateral arm weakness  hx of  11/23/2014   Essential hypertension 11/23/2014   Left paraspinal back pain 10/13/2013   Chronic constipation 09/16/2013   ESR raised 08/23/2013   Menopausal hot flushes 04/27/2012   Other and unspecified hyperlipidemia 04/27/2012   Migraine with aura    Hyperlipidemia 01/03/2012   Hypothyroidism 03/21/2010   HYPERTENSION, UNSPECIFIED 03/21/2010   CEREBROVASCULAR ACCIDENT, HX OF 03/21/2010    Past Medical History:  Diagnosis Date   Abnormal finding on MRI of brain 01/10/2015   CVA (cerebral infarction) 2010   diplopia neg acute MRi whit matter changes neg LP    Elevated LFTs 11/23/2014   High  blood pressure    readings   High cholesterol    Hip pain, acute 10/13/2013   Hx of varicella    Low bone density 07/13/2018   Migraine with aura    auras   Non-cardiac chest pain    hops and evaluation 2011 dr Morris   Osteoporosis 07/13/2018   Other and unspecified hyperlipidemia 04/27/2012   Has been on  meds for a number of years .  Low dose crestor  .  Baseline reported as 280/    PFO (patent foramen ovale)    Moderate size   Pneumonia 2007   and flu hospitalized    Primary biliary cholangitis (HCC) 04/08/2018   Sjogren's disease (HCC)    Stroke (HCC)    on hrt double vision- possible blood clot on brain; Dr. Francis Bull at [facility]    Thyroid  disease     Family History  Adopted: Yes  Problem Relation Age of Onset   Alcohol abuse Mother    Pancreatic cancer Mother    Liver disease Mother    Healthy Daughter    Breast cancer Maternal Aunt    Stomach cancer Neg Hx    Esophageal cancer Neg Hx    Colon cancer Neg Hx    Past Surgical History:  Procedure Laterality Date   BREAST SURGERY     implants   COLONOSCOPY     FACIAL COSMETIC SURGERY     FOOT SURGERY     had bunyon removed   NECK LIFT  05/06/2024   TONSILLECTOMY     TOOTH EXTRACTION  11/30/2018   TUBAL LIGATION     Social History   Social History Narrative   Hole between heart and lung-untreatable    Married    orig from  [place]   hh of 2   Pet dog euthanized    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    Neg tad   Some caffiene    Seatbelts no ets fa stored    G1P1   Physically active     5 yo grandaughter    Patient is left handed.      Immunization History  Administered Date(s) Administered   Fluad Quad(high Dose 65+) 09/10/2019, 11/14/2020   Influenza Split 09/08/2012   Influenza,inj,Quad PF,6+ Mos 08/23/2013, 11/16/2014, 09/07/2015, 08/23/2016, 09/02/2017, 09/08/2018   Moderna Sars-Covid-2 Vaccination 07/17/2020, 08/14/2020   Pneumococcal Conjugate-13 04/26/2011, 09/10/2019   Pneumococcal Polysaccharide-23 06/12/2021   Zoster Recombinant(Shingrix) 06/12/2021, 09/12/2021     Objective: Vital Signs: BP 134/81 (BP Location: Left Arm, Patient Position: Sitting, Cuff Size: Normal)   Pulse (!) 59   Resp 14   Ht 5' 3 (1.6 m)   Wt 130 lb 9.6  oz (59.2 kg)   LMP 04/25/2009   BMI 23.13 kg/m    Physical Exam Vitals and nursing note reviewed.  Constitutional:      Appearance: She is well-developed.  HENT:     Head: Normocephalic and atraumatic.  Eyes:     Conjunctiva/sclera: Conjunctivae normal.  Cardiovascular:     Rate and Rhythm: Normal rate and regular rhythm.     Heart sounds: Normal heart sounds.  Pulmonary:     Effort: Pulmonary effort is normal.     Breath sounds: Normal breath sounds.  Abdominal:     General: Bowel sounds are normal.     Palpations: Abdomen is soft.  Musculoskeletal:     Cervical back:  Normal range of motion.  Lymphadenopathy:     Cervical: No cervical adenopathy.  Skin:    General: Skin is warm and dry.     Capillary Refill: Capillary refill takes less than 2 seconds.  Neurological:     Mental Status: She is alert and oriented to person, place, and time.  Psychiatric:        Behavior: Behavior normal.      Musculoskeletal Exam: Cervical, thoracic and lumbar spine Juengel range of motion.  Shoulders, elbows, wrist joints, MCPs PIPs and DIPs with good range of motion with no synovitis.  Hip joints and knee joints with good range of motion.  There was no tenderness over ankles or MTPs.  CDAI Exam: CDAI Score: -- Patient Global: --; Provider Global: -- Swollen: --; Tender: -- Joint Exam 07/15/2024   No joint exam has been documented for this visit   There is currently no information documented on the homunculus. Go to the Rheumatology activity and complete the homunculus joint exam.  Investigation: No additional findings.  Imaging: No results found.  Recent Labs: Lab Results  Component Value Date   WBC 5.6 02/10/2024   HGB 12.3 02/10/2024   PLT 233 02/10/2024   NA 140 02/10/2024   K 4.8 02/10/2024   CL 106 02/10/2024   CO2 25 02/10/2024   GLUCOSE 84 02/10/2024   BUN 15 02/10/2024   CREATININE 1.08 (H) 02/10/2024   BILITOT 0.4 02/10/2024   ALKPHOS 106 09/23/2023   AST  24 02/10/2024   ALT 19 02/10/2024   PROT 7.3 02/10/2024   PROT 7.2 02/10/2024   ALBUMIN 4.4 09/23/2023   CALCIUM  10.1 02/10/2024   GFRAA 78 01/11/2021   QFTBGOLDPLUS NEGATIVE 03/06/2022    Speciality Comments: PLQ Eye Exam: 09/25/18 WNL @ [facility]  Plaquenil  discontinued due to tinnitus and constipation in 2019 after 2 months  Procedures:  No procedures performed Allergies: Estrogens, Codeine, Ibuprofen, and Sulfa antibiotics   Assessment / Plan:     Visit Diagnoses: Sjogren's syndrome with keratoconjunctivitis sicca (HCC) - ANA+, Ro+: pilocarpine  5 mg 1 tablet twice daily -patient continues to have dry mouth and dry eye symptoms.  She states her symptoms are manageable with pilocarpine  and over-the-counter eyedrops.  She has been experiencing increased fatigue.  She denies any history of oral ulcers, nasal ulcers, malar rash, photosensitivity or Raynaud's phenomenon.  No synovitis was noted on the examination.  I will obtain following labs today.  Plan: CBC with Differential/Platelet, Comprehensive metabolic panel with GFR, Protein / creatinine ratio, urine, Anti-DNA antibody, double-stranded, C3 and C4, ANA, Sedimentation rate, Sjogrens syndrome-A extractable nuclear antibody, Rheumatoid factor.  Will contact her once the lab results are available.  Other fatigue -she has been experiencing increased fatigue.  Plan: TSH, CK  Primary biliary cholangitis (HCC) - Under care of Dr. Avram  Age-related osteoporosis without current pathological fracture - DEXA updated on 03/12/2021: Right femoral neck T-score -2.7.  Left femoral neck T-score -2.4.Previously prescribed fosamax . Followed by PCP.  Patient has been off Fosamax .  I advised her to review the duration and when she took Fosamax  with Dr. Charlett.  If she had a drug holiday for 2 years then she should restart Fosamax .  The med problems listed as follows:  Patent foramen ovale  Vitamin D  deficiency -she has history of vitamin D  deficiency  and she has been experiencing increased fatigue.  I will check vitamin D  level today.  Plan: VITAMIN D  25 Hydroxy (Vit-D Deficiency, Fractures)  History of stroke  History of hyperlipidemia  History of hypothyroidism  History of recent fall  Orders: Orders Placed This Encounter  Procedures   CBC with Differential/Platelet   Comprehensive metabolic panel with GFR   Protein / creatinine ratio, urine   Anti-DNA antibody, double-stranded   C3 and C4   ANA   Sedimentation rate   Sjogrens syndrome-A extractable nuclear antibody   Rheumatoid factor   VITAMIN D  25 Hydroxy (Vit-D Deficiency, Fractures)   TSH   CK   No orders of the defined types were placed in this encounter.   Follow-Up Instructions: Return in about 1 year (around 07/15/2025) for Sjogren's, Osteoarthritis, Osteoporosis.   Maya Nash, MD  Note - This record has been created using Animal nutritionist.  Chart creation errors have been sought, but may not always  have been located. Such creation errors do not reflect on  the standard of medical care.

## 2024-07-04 ENCOUNTER — Other Ambulatory Visit: Payer: Self-pay | Admitting: Internal Medicine

## 2024-07-04 DIAGNOSIS — I10 Essential (primary) hypertension: Secondary | ICD-10-CM

## 2024-07-15 ENCOUNTER — Encounter: Payer: Self-pay | Admitting: Rheumatology

## 2024-07-15 ENCOUNTER — Ambulatory Visit: Attending: Rheumatology | Admitting: Rheumatology

## 2024-07-15 VITALS — BP 134/81 | HR 59 | Resp 14 | Ht 63.0 in | Wt 130.6 lb

## 2024-07-15 DIAGNOSIS — K743 Primary biliary cirrhosis: Secondary | ICD-10-CM | POA: Diagnosis not present

## 2024-07-15 DIAGNOSIS — R5383 Other fatigue: Secondary | ICD-10-CM | POA: Diagnosis not present

## 2024-07-15 DIAGNOSIS — M810 Age-related osteoporosis without current pathological fracture: Secondary | ICD-10-CM

## 2024-07-15 DIAGNOSIS — Q2112 Patent foramen ovale: Secondary | ICD-10-CM

## 2024-07-15 DIAGNOSIS — R221 Localized swelling, mass and lump, neck: Secondary | ICD-10-CM

## 2024-07-15 DIAGNOSIS — M3501 Sicca syndrome with keratoconjunctivitis: Secondary | ICD-10-CM

## 2024-07-15 DIAGNOSIS — Z8673 Personal history of transient ischemic attack (TIA), and cerebral infarction without residual deficits: Secondary | ICD-10-CM

## 2024-07-15 DIAGNOSIS — Z79899 Other long term (current) drug therapy: Secondary | ICD-10-CM

## 2024-07-15 DIAGNOSIS — M85851 Other specified disorders of bone density and structure, right thigh: Secondary | ICD-10-CM

## 2024-07-15 DIAGNOSIS — Z9181 History of falling: Secondary | ICD-10-CM

## 2024-07-15 DIAGNOSIS — E559 Vitamin D deficiency, unspecified: Secondary | ICD-10-CM

## 2024-07-15 DIAGNOSIS — Z8639 Personal history of other endocrine, nutritional and metabolic disease: Secondary | ICD-10-CM

## 2024-07-15 DIAGNOSIS — R768 Other specified abnormal immunological findings in serum: Secondary | ICD-10-CM

## 2024-07-19 ENCOUNTER — Ambulatory Visit: Payer: Self-pay | Admitting: *Deleted

## 2024-07-19 LAB — PROTEIN / CREATININE RATIO, URINE
Creatinine, Urine: 43 mg/dL (ref 20–275)
Total Protein, Urine: <4 mg/dL — ABNORMAL LOW (ref 5–24)

## 2024-07-19 LAB — C3 AND C4
C3 Complement: 180 mg/dL (ref 83–193)
C4 Complement: 38 mg/dL (ref 15–57)

## 2024-07-19 LAB — CBC WITH DIFFERENTIAL/PLATELET
Absolute Lymphocytes: 1166 {cells}/uL (ref 850–3900)
Absolute Monocytes: 545 {cells}/uL (ref 200–950)
Basophils Absolute: 61 {cells}/uL (ref 0–200)
Basophils Relative: 1.1 %
Eosinophils Absolute: 110 {cells}/uL (ref 15–500)
Eosinophils Relative: 2 %
HCT: 37.2 % (ref 35.0–45.0)
Hemoglobin: 11.9 g/dL (ref 11.7–15.5)
MCH: 30.1 pg (ref 27.0–33.0)
MCHC: 32 g/dL (ref 32.0–36.0)
MCV: 94.2 fL (ref 80.0–100.0)
MPV: 12.7 fL — ABNORMAL HIGH (ref 7.5–12.5)
Monocytes Relative: 9.9 %
Neutro Abs: 3619 {cells}/uL (ref 1500–7800)
Neutrophils Relative %: 65.8 %
Platelets: 236 Thousand/uL (ref 140–400)
RBC: 3.95 Million/uL (ref 3.80–5.10)
RDW: 13 % (ref 11.0–15.0)
Total Lymphocyte: 21.2 %
WBC: 5.5 Thousand/uL (ref 3.8–10.8)

## 2024-07-19 LAB — COMPREHENSIVE METABOLIC PANEL WITH GFR
AG Ratio: 1.5 (calc) (ref 1.0–2.5)
ALT: 18 U/L (ref 6–29)
AST: 20 U/L (ref 10–35)
Albumin: 4.4 g/dL (ref 3.6–5.1)
Alkaline phosphatase (APISO): 121 U/L (ref 37–153)
BUN/Creatinine Ratio: 14 (calc) (ref 6–22)
BUN: 16 mg/dL (ref 7–25)
CO2: 26 mmol/L (ref 20–32)
Calcium: 10.2 mg/dL (ref 8.6–10.4)
Chloride: 103 mmol/L (ref 98–110)
Creat: 1.11 mg/dL — ABNORMAL HIGH (ref 0.50–1.05)
Globulin: 2.9 g/dL (ref 1.9–3.7)
Glucose, Bld: 78 mg/dL (ref 65–99)
Potassium: 4.5 mmol/L (ref 3.5–5.3)
Sodium: 138 mmol/L (ref 135–146)
Total Bilirubin: 0.6 mg/dL (ref 0.2–1.2)
Total Protein: 7.3 g/dL (ref 6.1–8.1)
eGFR: 54 mL/min/1.73m2 — ABNORMAL LOW (ref >=60)

## 2024-07-19 LAB — ANTI-NUCLEAR AB-TITER (ANA TITER)
ANA TITER: 1:1280 {titer} — ABNORMAL HIGH
ANA TITER: >=1:1280 {titer} — AB
ANA Titer 1: 1:320 {titer} — ABNORMAL HIGH

## 2024-07-19 LAB — CK: Total CK: 51 U/L (ref 20–243)

## 2024-07-19 LAB — ANA: Anti Nuclear Antibody (ANA): POSITIVE — AB

## 2024-07-19 LAB — SEDIMENTATION RATE: Sed Rate: 17 mm/h (ref 0–30)

## 2024-07-19 LAB — TSH: TSH: 0.49 m[IU]/L (ref 0.40–4.50)

## 2024-07-19 LAB — SJOGRENS SYNDROME-A EXTRACTABLE NUCLEAR ANTIBODY: SSA (Ro) (ENA) Antibody, IgG: 1.2 AI — AB

## 2024-07-19 LAB — RHEUMATOID FACTOR: Rheumatoid fact SerPl-aCnc: 11 [IU]/mL (ref <14)

## 2024-07-19 LAB — VITAMIN D 25 HYDROXY (VIT D DEFICIENCY, FRACTURES): Vit D, 25-Hydroxy: 35 ng/mL (ref 30–100)

## 2024-07-19 LAB — ANTI-DNA ANTIBODY, DOUBLE-STRANDED: ds DNA Ab: <1 [IU]/mL

## 2024-07-19 NOTE — Progress Notes (Signed)
 ANA titer is positive and stable.  No change in treatment advised.

## 2024-07-21 ENCOUNTER — Ambulatory Visit: Admitting: Family Medicine

## 2024-07-21 ENCOUNTER — Encounter: Payer: Self-pay | Admitting: Family Medicine

## 2024-07-21 VITALS — BP 144/80 | HR 80 | Temp 98.4°F | Wt 130.8 lb

## 2024-07-21 DIAGNOSIS — R059 Cough, unspecified: Secondary | ICD-10-CM

## 2024-07-21 DIAGNOSIS — R5383 Other fatigue: Secondary | ICD-10-CM | POA: Diagnosis not present

## 2024-07-21 LAB — POC COVID19 BINAXNOW: SARS Coronavirus 2 Ag: NEGATIVE

## 2024-07-21 NOTE — Progress Notes (Signed)
 Established Patient Office Visit  Subjective   Patient ID: Dana [name], female    DOB: 03-10-1956  Age: 68 y.o. MRN: [id number]  Chief Complaint  Patient presents with   Fatigue   Sore Throat   Cough    HPI   Dana [name] is seen with approximately 2-week history of increased fatigue much more than usual baseline.  She does have history of Sjogren's syndrome and is followed by rheumatologist.  She had multiple recent labs done including thyroid  function, CBC, CMP, multiple autoimmune screens and these were all basically normal.  GFR 54 compared with 56 back in March.  She does have hypothyroidism and is on replacement with recent TSH normal.  She takes atorvastatin  and losartan .  Her main symptoms have been occasional headache, mild cough, mild sore throat.  She wondered if some of this may be allergic related.  No known sick exposures.  No documented fever.  No dysuria. No abdominal pain.  She also wonders if she may have had COVID recently though she has never had COVID infection previously.  Past Medical History:  Diagnosis Date   Abnormal finding on MRI of brain 01/10/2015   CVA (cerebral infarction) 2010   diplopia neg acute MRi whit matter changes neg LP    Elevated LFTs 11/23/2014   High blood pressure    readings   High cholesterol    Hip pain, acute 10/13/2013   Hx of varicella    Low bone density 07/13/2018   Migraine with aura    auras   Non-cardiac chest pain    hops and evaluation 2011 dr Morris   Osteoporosis 07/13/2018   Other and unspecified hyperlipidemia 04/27/2012   Has been on meds for a number of years .  Low dose crestor  .  Baseline reported as 280/    PFO (patent foramen ovale)    Moderate size   Pneumonia 2007   and flu hospitalized    Primary biliary cholangitis (HCC) 04/08/2018   Sjogren's disease (HCC)    Stroke (HCC)    on hrt double vision- possible blood clot on brain; Dr. Francis Bull at [facility]    Thyroid  disease    Past Surgical  History:  Procedure Laterality Date   BREAST SURGERY     implants   COLONOSCOPY     FACIAL COSMETIC SURGERY     FOOT SURGERY     had bunyon removed   NECK LIFT  05/06/2024   TONSILLECTOMY     TOOTH EXTRACTION  11/30/2018   TUBAL LIGATION      reports that she has never smoked. She has never been exposed to tobacco smoke. She has never used smokeless tobacco. She reports that she does not drink alcohol and does not use drugs. family history includes Alcohol abuse in her mother; Breast cancer in her maternal aunt; Healthy in her daughter; Liver disease in her mother; Pancreatic cancer in her mother. She was adopted. Allergies  Allergen Reactions   Estrogens Other (See Comments)    Had stroke on HRT/OCP   Codeine     REACTION: nausea   Ibuprofen     REACTION: stomach pain   Sulfa Antibiotics Itching    Pt reported on 05/03/2020    Review of Systems  Constitutional:  Positive for malaise/fatigue. Negative for chills and fever.  HENT:  Positive for congestion and sore throat.   Respiratory:  Positive for cough. Negative for hemoptysis, sputum production, shortness of breath and wheezing.  Cardiovascular:  Negative for chest pain.  Gastrointestinal:  Negative for abdominal pain, diarrhea, nausea and vomiting.  Genitourinary:  Negative for dysuria.      Objective:     BP (!) 144/80   Pulse 80   Temp 98.4 F (36.9 C) (Oral)   Wt 130 lb 12.8 oz (59.3 kg)   LMP 04/25/2009   SpO2 97%   BMI 23.17 kg/m  BP Readings from Last 3 Encounters:  07/21/24 (!) 144/80  07/15/24 134/81  05/21/24 122/84   Wt Readings from Last 3 Encounters:  07/21/24 130 lb 12.8 oz (59.3 kg)  07/15/24 130 lb 9.6 oz (59.2 kg)  05/21/24 130 lb (59 kg)      Physical Exam Vitals reviewed.  Constitutional:      General: She is not in acute distress.    Appearance: She is well-developed. She is not ill-appearing.  HENT:     Right Ear: Tympanic membrane normal.     Left Ear: Tympanic membrane  normal.     Mouth/Throat:     Mouth: Mucous membranes are moist.     Pharynx: Oropharynx is clear.     Comments: She has had previous tonsillectomy.  No erythema or exudate. Eyes:     Pupils: Pupils are equal, round, and reactive to light.  Neck:     Thyroid : No thyromegaly.     Vascular: No JVD.  Cardiovascular:     Rate and Rhythm: Normal rate and regular rhythm.     Heart sounds:     No gallop.  Pulmonary:     Effort: Pulmonary effort is normal. No respiratory distress.     Breath sounds: Normal breath sounds. No wheezing or rales.  Abdominal:     Palpations: Abdomen is soft. There is no mass.     Tenderness: There is no abdominal tenderness. There is no guarding or rebound.  Musculoskeletal:     Cervical back: Neck supple.  Skin:    Findings: No rash.  Neurological:     General: No focal deficit present.     Mental Status: She is alert.      No results found for any visits on 07/21/24.    The ASCVD Risk score (Arnett DK, et al., 2019) failed to calculate for the following reasons:   Risk score cannot be calculated because patient has a medical history suggesting prior/existing ASCVD    Assessment & Plan:   Nonspecific fatigue past couple weeks.  She has had some intermittent headaches, cough some sore throat.  She wonders about COVID and is aware that testing this far into symptoms might yield false negative.  Nevertheless, she would like to be screened.  She had multiple recent labs done per rheumatology and these were reviewed.  CBC and TSH were normal.  COVID screen is negative.  Recommend another week of observation.  Consider chest x-ray in 1 week if symptoms not improving.  Follow-up immediately for any fever or new or worsening symptoms Wolm Scarlet, MD

## 2024-07-21 NOTE — Patient Instructions (Signed)
 Let me know in 1-2 weeks if  no better and we can get CXR at that time.

## 2024-08-20 ENCOUNTER — Other Ambulatory Visit: Payer: Self-pay | Admitting: Physician Assistant

## 2024-08-20 NOTE — Telephone Encounter (Signed)
 Last Fill: 05/23/2024  Next Visit: 07/14/2025  Last Visit: 07/15/2024  Dx:  Sjogren's syndrome with keratoconjunctivitis sicca   Current Dose per office note on 07/15/2024: pilocarpine  5 mg 1 tablet twice daily   Okay to refill Pilocarpine ?

## 2024-09-06 LAB — HM MAMMOGRAPHY

## 2024-09-07 ENCOUNTER — Encounter: Payer: Self-pay | Admitting: Internal Medicine

## 2024-09-23 ENCOUNTER — Ambulatory Visit: Admitting: Internal Medicine

## 2024-09-23 ENCOUNTER — Encounter: Payer: Self-pay | Admitting: Internal Medicine

## 2024-09-23 VITALS — BP 120/72 | HR 57 | Temp 98.1°F | Ht 63.58 in | Wt 130.4 lb

## 2024-09-23 DIAGNOSIS — I10 Essential (primary) hypertension: Secondary | ICD-10-CM | POA: Diagnosis not present

## 2024-09-23 DIAGNOSIS — E039 Hypothyroidism, unspecified: Secondary | ICD-10-CM | POA: Diagnosis not present

## 2024-09-23 DIAGNOSIS — Z0000 Encounter for general adult medical examination without abnormal findings: Secondary | ICD-10-CM | POA: Diagnosis not present

## 2024-09-23 DIAGNOSIS — E785 Hyperlipidemia, unspecified: Secondary | ICD-10-CM | POA: Diagnosis not present

## 2024-09-23 DIAGNOSIS — R944 Abnormal results of kidney function studies: Secondary | ICD-10-CM

## 2024-09-23 LAB — LIPID PANEL
Cholesterol: 240 mg/dL — ABNORMAL HIGH (ref 0–200)
HDL: 74.9 mg/dL (ref >39.00)
LDL Cholesterol: 134 mg/dL — ABNORMAL HIGH (ref 0–99)
NonHDL: 165.25
Total CHOL/HDL Ratio: 3
Triglycerides: 158 mg/dL — ABNORMAL HIGH (ref 0.0–149.0)
VLDL: 31.6 mg/dL (ref 0.0–40.0)

## 2024-09-23 LAB — BASIC METABOLIC PANEL WITH GFR
BUN: 17 mg/dL (ref 6–23)
CO2: 28 meq/L (ref 19–32)
Calcium: 10.1 mg/dL (ref 8.4–10.5)
Chloride: 102 meq/L (ref 96–112)
Creatinine, Ser: 1.14 mg/dL (ref 0.40–1.20)
GFR: 48.93 mL/min — ABNORMAL LOW (ref >60.00)
Glucose, Bld: 85 mg/dL (ref 70–99)
Potassium: 4.2 meq/L (ref 3.5–5.1)
Sodium: 137 meq/L (ref 135–145)

## 2024-09-23 NOTE — Progress Notes (Signed)
 Chief Complaint  Patient presents with   Annual Exam    HPI: Patient  Dana [name]  68 y.o. comes in today for Preventive Health Care visit  and med check  She  is under care for sjogrens , and PBC sees  Dr Avram and dr Germaine . Has problematic dry eyes and mouth  Sees Dr Octavia  . Nail spits on r thumb.  Thyroid : taking same med  last tsh in August   Health Maintenance  Topic Date Due   COVID-19 Vaccine (3 - Moderna risk series) 10/09/2024 (Originally 09/11/2020)   Influenza Vaccine  02/22/2025 (Originally 06/25/2024)   DTaP/Tdap/Td (1 - Tdap) 09/23/2025 (Originally 08/31/1975)   Mammogram  09/06/2026   Colonoscopy  01/07/2033   Pneumococcal Vaccine: 50+ Years  Completed   DEXA SCAN  Completed   Hepatitis C Screening  Completed   Zoster Vaccines- Shingrix  Completed   Meningococcal B Vaccine  Aged Out   Health Maintenance Review LIFESTYLE:  Exercise:  every week house cleaning and adl.  3 story house  Tobacco/ETS: n Alcohol:   n Sugar beverages: n Sleep: about  7- 8  Drug use: no HH of  2 no pets  Sometimes  forgets   [place] loss of name   otherwise no change memory ok calculation direction and executive function  Feet can swell at end of day when sitting and down  ROS:  GEN/ HEENT: No fever, significant weight changes sweats headaches vision problems hearing changes, CV/ PULM; No chest pain shortness of breath cough, syncope,edema  change in exercise tolerance. GI /GU: No adominal pain, vomiting, change in bowel habits. No blood in the stool. No significant GU symptoms. SKIN/HEME: ,no acute skin rashes suspicious lesions or bleeding. No lymphadenopathy, nodules, masses.  NEURO/ PSYCH:  No neurologic signs such as weakness numbness. No depression anxiety. IMM/ Allergy: No unusual infections.  Allergy .   REST of 12 system review negative except as per HPI   Past Medical History:  Diagnosis Date   Abnormal finding on MRI of brain 01/10/2015   CVA (cerebral  infarction) 2010   diplopia neg acute MRi whit matter changes neg LP    Elevated LFTs 11/23/2014   High blood pressure    readings   High cholesterol    Hip pain, acute 10/13/2013   Hx of varicella    Low bone density 07/13/2018   Migraine with aura    auras   Non-cardiac chest pain    hops and evaluation 2011 dr Morris   Osteoporosis 07/13/2018   Other and unspecified hyperlipidemia 04/27/2012   Has been on meds for a number of years .  Low dose crestor  .  Baseline reported as 280/    PFO (patent foramen ovale)    Moderate size   Pneumonia 2007   and flu hospitalized    Primary biliary cholangitis (HCC) 04/08/2018   Sjogren's disease    Stroke (HCC)    on hrt double vision- possible blood clot on brain; Dr. Francis Bull at [facility]    Thyroid  disease     Past Surgical History:  Procedure Laterality Date   BREAST SURGERY     implants   COLONOSCOPY     FACIAL COSMETIC SURGERY     FOOT SURGERY     had bunyon removed   NECK LIFT  05/06/2024   TONSILLECTOMY     TOOTH EXTRACTION  11/30/2018   TUBAL LIGATION      Family History  Adopted: Yes  Problem Relation Age of Onset   Alcohol abuse Mother    Pancreatic cancer Mother    Liver disease Mother    Healthy Daughter    Breast cancer Maternal Aunt    Stomach cancer Neg Hx    Esophageal cancer Neg Hx    Colon cancer Neg Hx     Social History   Socioeconomic History   Marital status: Married    Spouse name: Not on file   Number of children: 1   Years of education: Not on file   Highest education level: Associate degree: occupational, scientist, product/process development, or vocational program  Occupational History   Not on file  Tobacco Use   Smoking status: Never    Passive exposure: Never   Smokeless tobacco: Never  Vaping Use   Vaping status: Never Used  Substance and Sexual Activity   Alcohol use: No   Drug use: No   Sexual activity: Yes  Other Topics Concern   Not on file  Social History Narrative   Hole between  heart and lung-untreatable    Married    orig from  [place]   hh of 2   Pet dog euthanized    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    Neg tad   Some caffiene    Seatbelts no ets fa stored    G1P1   Physically active     5 yo grandaughter    Patient is left handed.      Social Drivers of Health   Financial Resource Strain: Patient Declined (09/21/2024)   Overall Financial Resource Strain (CARDIA)    Difficulty of Paying Living Expenses: Patient declined  Food Insecurity: No Food Insecurity (09/21/2024)   Hunger Vital Sign    Worried About Running Out of Food in the Last Year: Never true    Ran Out of Food in the Last Year: Never true  Transportation Needs: No Transportation Needs (09/21/2024)   PRAPARE - Administrator, Civil Service (Medical): No    Lack of Transportation (Non-Medical): No  Physical Activity: Insufficiently Active (09/21/2024)   Exercise Vital Sign    Days of Exercise per Week: 1 day    Minutes of Exercise per Session: 10 min  Stress: No Stress Concern Present (09/21/2024)   [employer] of Occupational Health - Occupational Stress Questionnaire    Feeling of Stress: Not at all  Social Connections: Socially Integrated (09/21/2024)   Social Connection and Isolation Panel    Frequency of Communication with Friends and Family: More than three times a week    Frequency of Social Gatherings with Friends and Family: Three times a week    Attends Religious Services: More than 4 times per year    Active Member of Clubs or Organizations: Yes    Attends Banker Meetings: More than 4 times per year    Marital Status: Married    Outpatient Medications Prior to Visit  Medication Sig Dispense Refill   aspirin 81 MG tablet Take 81 mg by mouth daily.     atorvastatin  (LIPITOR) 20 MG tablet TAKE 1 TABLET BY MOUTH EVERY DAY 90 tablet 2   levothyroxine  (SYNTHROID ) 88 MCG tablet Take 1 tablet (88  mcg total) by mouth daily. Dosage change 90 tablet 1   losartan  (COZAAR ) 50 MG tablet TAKE 1 TABLET BY MOUTH EVERY DAY 90 tablet 1   Multiple Vitamins-Minerals (  MULTIVITAMIN GUMMIES ADULT PO) Take by mouth in the morning and at bedtime.     pilocarpine  (SALAGEN ) 5 MG tablet TAKE 1 TABLET BY MOUTH TWICE A DAY 180 tablet 0   polyethylene glycol (MIRALAX / GLYCOLAX) 17 g packet Take 17 g by mouth daily.     ursodiol  (ACTIGALL ) 250 MG tablet TAKE 1 TABLET BY MOUTH TWICE A DAY 180 tablet 3   XIIDRA 5 % SOLN Apply 1 drop to eye 2 (two) times daily.     No facility-administered medications prior to visit.     EXAM:  BP 120/72 (BP Location: Left Arm, Patient Position: Sitting, Cuff Size: Normal)   Pulse (!) 57   Temp 98.1 F (36.7 C) (Oral)   Ht 5' 3.58 (1.615 m)   Wt 130 lb 6.4 oz (59.1 kg)   LMP 04/25/2009   SpO2 97%   BMI 22.68 kg/m   Body mass index is 22.68 kg/m. Wt Readings from Last 3 Encounters:  09/23/24 130 lb 6.4 oz (59.1 kg)  07/21/24 130 lb 12.8 oz (59.3 kg)  07/15/24 130 lb 9.6 oz (59.2 kg)    Physical Exam: Vital signs reviewed HZW:Uypd is a well-developed well-nourished alert cooperative    who appearsr stated age in no acute distress.  HEENT: normocephalic atraumatic , Eyes: PERRL EOM's full, conjunctiva clear, Nares: paten,t no deformity discharge or tenderness., Ears: no deformity EAC's clear TMs with normal landmarks. Mouth: clear OP, no lesions, edema.  Moist mucous membranes. Dentition in adequate repair. NECK: supple without masses, thyromegaly or bruits. CHEST/PULM:  Clear to auscultation and percussion breath sounds equal no wheeze , rales or rhonchi.  Breast: . No dimpling, discharge, masses, tenderness or discharge . CV: PMI is nondisplaced, S1 S2 no gallops, murmurs, rubs. Peripheral pulses are full without delay.No JVD .  ABDOMEN: Bowel sounds normal nontender  No guard or rebound, no hepato splenomegal no CVA tenderness.  Extremtities:  No clubbing  cyanosis or edema, no acute joint swelling or redness no focal atrophy NEURO:  Oriented x3, cranial nerves 3-12 appear to be intact, no obvious focal weakness,gait within normal limits no abnormal reflexes or asymmetrical SKIN: No acute rashes normal turgor, color, no bruising or petechiae. R thumb with vertical crack no dystrophy  PSYCH: Oriented, good eye contact, no obvious depression anxiety, cognition and judgment appear normal. LN: no cervical axillary adenopathy  Lab Results  Component Value Date   WBC 5.5 07/15/2024   HGB 11.9 07/15/2024   HCT 37.2 07/15/2024   PLT 236 07/15/2024   GLUCOSE 78 07/15/2024   CHOL 169 09/23/2023   TRIG 68.0 09/23/2023   HDL 81.60 09/23/2023   LDLCALC 74 09/23/2023   ALT 18 07/15/2024   AST 20 07/15/2024   NA 138 07/15/2024   K 4.5 07/15/2024   CL 103 07/15/2024   CREATININE 1.11 (H) 07/15/2024   BUN 16 07/15/2024   CO2 26 07/15/2024   TSH 0.49 07/15/2024   INR 1.0 02/16/2020   HGBA1C 5.7 09/23/2023    BP Readings from Last 3 Encounters:  09/23/24 120/72  07/21/24 (!) 144/80  07/15/24 134/81   Last vitamin D  Lab Results  Component Value Date   VD25OH 35 07/15/2024    Lab plan reviewed with patient  record review gfr  down from aug but  has had nl cystatin c in  past and neg urine protein screen  ASSESSMENT AND PLAN:  Discussed the following assessment and plan:    ICD-10-CM   1. Visit  for preventive health examination  Z00.00     2. Decreased calculated GFR  R94.4 Cystatin C with Glomerular Filtration Rate, Estimated (eGFR)    Basic metabolic panel with GFR    Lipid panel    3. Essential hypertension  I10 Cystatin C with Glomerular Filtration Rate, Estimated (eGFR)    Basic metabolic panel with GFR    Lipid panel    4. Hypothyroidism, unspecified type  E03.9 Cystatin C with Glomerular Filtration Rate, Estimated (eGFR)    Basic metabolic panel with GFR    Lipid panel    5. Hyperlipidemia, unspecified hyperlipidemia type   E78.5 Cystatin C with Glomerular Filtration Rate, Estimated (eGFR)    Basic metabolic panel with GFR    Lipid panel    Good bp control on arb.  Repeat egfr with cystatin c and lipid panel  Continue lifestyle intervention healthy eating and exercise .  Return in about 1 year (around 09/23/2025).  Patient Care Team: Nyasia Baxley, Apolinar POUR, MD as PCP - General (Internal Medicine) Gorge Ade, MD as Attending Physician (Obstetrics and Gynecology) Dolphus Reiter, MD as Consulting Physician (Rheumatology) Avram Lupita BRAVO, MD as Consulting Physician (Gastroenterology) Patient Instructions  Thyroid  tests done in August was in range. .  Repeat kidney function today   and cholesterol.    Tonette Koehne K. Aran Menning M.D.

## 2024-09-23 NOTE — Patient Instructions (Addendum)
 Thyroid  tests done in August was in range. .  Repeat kidney function today   and cholesterol.

## 2024-09-24 ENCOUNTER — Encounter: Payer: Self-pay | Admitting: Internal Medicine

## 2024-09-24 ENCOUNTER — Other Ambulatory Visit: Payer: Self-pay | Admitting: Internal Medicine

## 2024-09-24 DIAGNOSIS — R944 Abnormal results of kidney function studies: Secondary | ICD-10-CM

## 2024-09-24 DIAGNOSIS — I10 Essential (primary) hypertension: Secondary | ICD-10-CM

## 2024-09-26 LAB — CYSTATIN C WITH GLOMERULAR FILTRATION RATE, ESTIMATED (EGFR)
CYSTATIN C: 1.35 mg/L — ABNORMAL HIGH (ref 0.52–1.10)
eGFR: 47 mL/min/1.73m2 — ABNORMAL LOW (ref >=60)

## 2024-09-28 NOTE — Telephone Encounter (Signed)
 I reviewed the  kidney function Last year the cystatin was normal and thus no evidence of   decrease kidney function and had a renal scan that was normal and reassuring  There is no  increase protein in  urint that is also reassuring .  But indeed the gfr calculated byt creatinine and cystatin is down but not dramatically . Sometimes meds can do this but also there is a  baseline decline with age .  BP control is important and your bp at last visit was good  Make sure not taking any meds like advil aleve   nsaid type meds that can negatively effect the  kidneys.   Would like to get a full urinalysis with micro   to be sure no abnormalities.  We can  do a nephrology referral consult   if you want to see if any advice but follow . Since  you have autoimmune conditions   But  in interim can  follow every 6 months bmp for now .  Let us  know  if you want us  to do a referral .

## 2024-09-28 NOTE — Telephone Encounter (Unsigned)
 Copied from CRM #[id number]. Topic: Clinical - Lab/Test Results >> Sep 28, 2024 12:38 PM Shereese L wrote: Reason for CRM: patient is requesting for the office to giver her a call back with results

## 2024-09-29 ENCOUNTER — Other Ambulatory Visit: Payer: Self-pay

## 2024-09-29 DIAGNOSIS — I10 Essential (primary) hypertension: Secondary | ICD-10-CM

## 2024-09-29 MED ORDER — LOSARTAN POTASSIUM 50 MG PO TABS: 50.0000 mg | ORAL_TABLET | Freq: Every day | ORAL | 1 refills | Status: AC

## 2024-09-29 MED ORDER — ATORVASTATIN CALCIUM 20 MG PO TABS: 20.0000 mg | ORAL_TABLET | Freq: Every day | ORAL | 2 refills | Status: AC

## 2024-09-29 NOTE — Telephone Encounter (Signed)
 Spoke to pt and went over lab result. Pt states she would like to proceed with nephrology referral. Pt request to be seen with provider in [place]. Dr. Gaynell Clos. Address [address] A referral is placed. Inform pt, someone from nephrology office will be contacting her to set up the appt.   Scheduled pt a lab appt for this Friday for urinalysis with micro. Pt ask about her lipids if Dr. Charlett wants her stay or change her dose on lipitor as her triglyceride was high. Inform pt Dr. Charlett didn't mention about her cholesterol or change in the medication. When that happens, usually provider would have pt to continue taking her regular dose.  Pt request a refill on her Lipitor and BP med- losartan . Rx sent.    Forwarding to provider for FYI.

## 2024-09-30 NOTE — Telephone Encounter (Signed)
  Triglycerides are mostly effected by  diet and activity.  Limiting processed foods  carbohydrates  sugars and ocass transfats  can control the number in most people. Also exercise can be helpful. I suggest  attention to this and can repeat FLP in 3-4 months  after  working on this

## 2024-10-01 ENCOUNTER — Other Ambulatory Visit

## 2024-10-01 DIAGNOSIS — R944 Abnormal results of kidney function studies: Secondary | ICD-10-CM

## 2024-10-01 DIAGNOSIS — I10 Essential (primary) hypertension: Secondary | ICD-10-CM

## 2024-10-02 LAB — URINALYSIS, COMPLETE
Bacteria, UA: NONE SEEN /HPF
Bilirubin Urine: NEGATIVE
Glucose, UA: NEGATIVE
Hgb urine dipstick: NEGATIVE
Hyaline Cast: NONE SEEN /LPF
Ketones, ur: NEGATIVE
Leukocytes,Ua: NEGATIVE
Nitrite: NEGATIVE
Protein, ur: NEGATIVE
RBC / HPF: NONE SEEN /HPF (ref 0–2)
Specific Gravity, Urine: 1.019 (ref 1.001–1.035)
Squamous Epithelial / HPF: NONE SEEN /HPF (ref <=5)
WBC, UA: NONE SEEN /HPF (ref 0–5)
pH: 6.5 (ref 5.0–8.0)

## 2024-10-03 ENCOUNTER — Ambulatory Visit: Payer: Self-pay | Admitting: Internal Medicine

## 2024-10-03 NOTE — Progress Notes (Signed)
 Complete urinalysis  is normal and no evidence of active renal disease reassuring

## 2024-10-03 NOTE — Telephone Encounter (Signed)
 Keep the renal appt  to ge a consult  because this issue  will continue to give ?s   I think things are stable but since you have autoimmune disease   advice from renal about following  may be helpful .  Dont think liver is causing high cholesterol .

## 2024-11-10 ENCOUNTER — Encounter: Payer: Self-pay | Admitting: Internal Medicine

## 2024-12-03 ENCOUNTER — Other Ambulatory Visit: Payer: Self-pay | Admitting: Rheumatology

## 2024-12-03 NOTE — Telephone Encounter (Signed)
 Last Fill: 08/20/2024  Next Visit: 07/14/2025  Last Visit: 07/15/2024  DX: Sjogren's syndrome with keratoconjunctivitis sicca   Current Dose per office note on 07/15/2024: pilocarpine  5 mg 1 tablet twice daily   Okay to refill pilocarpine ?

## 2024-12-16 ENCOUNTER — Encounter: Payer: Self-pay | Admitting: Internal Medicine

## 2024-12-16 ENCOUNTER — Other Ambulatory Visit: Payer: Self-pay | Admitting: Internal Medicine

## 2024-12-16 DIAGNOSIS — K743 Primary biliary cirrhosis: Secondary | ICD-10-CM

## 2024-12-24 ENCOUNTER — Encounter: Payer: Self-pay | Admitting: Internal Medicine

## 2025-07-14 ENCOUNTER — Ambulatory Visit: Admitting: Rheumatology
